# Patient Record
Sex: Female | Born: 1950 | Race: White | Hispanic: No | Marital: Married | Smoking: Never smoker
Health system: Southern US, Community
[De-identification: ages and names within clinical notes are randomized; demographics above are authoritative.]

## PROBLEM LIST (undated history)

## (undated) DIAGNOSIS — K529 Noninfective gastroenteritis and colitis, unspecified: Secondary | ICD-10-CM

## (undated) DIAGNOSIS — I10 Essential (primary) hypertension: Secondary | ICD-10-CM

## (undated) DIAGNOSIS — N200 Calculus of kidney: Secondary | ICD-10-CM

## (undated) DIAGNOSIS — G43909 Migraine, unspecified, not intractable, without status migrainosus: Secondary | ICD-10-CM

## (undated) DIAGNOSIS — J45909 Unspecified asthma, uncomplicated: Secondary | ICD-10-CM

## (undated) HISTORY — PX: SHOULDER SURGERY: SHX246

## (undated) HISTORY — PX: ABDOMINAL HYSTERECTOMY: SHX81

## (undated) HISTORY — PX: TONSILLECTOMY: SUR1361

---

## 2000-12-27 ENCOUNTER — Encounter: Payer: Self-pay | Admitting: *Deleted

## 2000-12-28 ENCOUNTER — Encounter: Payer: Self-pay | Admitting: *Deleted

## 2000-12-28 ENCOUNTER — Inpatient Hospital Stay (HOSPITAL_COMMUNITY): Admission: EM | Admit: 2000-12-28 | Discharge: 2001-01-01 | Payer: Self-pay | Admitting: *Deleted

## 2001-01-26 ENCOUNTER — Ambulatory Visit (HOSPITAL_COMMUNITY): Admission: RE | Admit: 2001-01-26 | Discharge: 2001-01-26 | Payer: Self-pay | Admitting: Internal Medicine

## 2001-11-02 ENCOUNTER — Ambulatory Visit (HOSPITAL_COMMUNITY): Admission: RE | Admit: 2001-11-02 | Discharge: 2001-11-02 | Payer: Self-pay | Admitting: Obstetrics & Gynecology

## 2001-11-02 ENCOUNTER — Encounter: Payer: Self-pay | Admitting: Obstetrics & Gynecology

## 2002-05-13 ENCOUNTER — Encounter (HOSPITAL_COMMUNITY): Admission: RE | Admit: 2002-05-13 | Discharge: 2002-06-12 | Payer: Self-pay | Admitting: Internal Medicine

## 2002-05-14 ENCOUNTER — Ambulatory Visit: Admission: RE | Admit: 2002-05-14 | Discharge: 2002-05-14 | Payer: Self-pay | Admitting: Internal Medicine

## 2002-05-14 ENCOUNTER — Encounter: Payer: Self-pay | Admitting: Internal Medicine

## 2002-05-21 ENCOUNTER — Encounter: Payer: Self-pay | Admitting: Neurology

## 2002-05-21 ENCOUNTER — Inpatient Hospital Stay (HOSPITAL_COMMUNITY): Admission: EM | Admit: 2002-05-21 | Discharge: 2002-05-24 | Payer: Self-pay | Admitting: Emergency Medicine

## 2002-05-24 ENCOUNTER — Encounter: Payer: Self-pay | Admitting: Family Medicine

## 2002-05-24 ENCOUNTER — Encounter: Payer: Self-pay | Admitting: *Deleted

## 2002-05-31 ENCOUNTER — Ambulatory Visit (HOSPITAL_COMMUNITY): Admission: RE | Admit: 2002-05-31 | Discharge: 2002-05-31 | Payer: Self-pay | Admitting: Internal Medicine

## 2002-05-31 ENCOUNTER — Encounter: Payer: Self-pay | Admitting: Internal Medicine

## 2002-06-01 ENCOUNTER — Ambulatory Visit (HOSPITAL_COMMUNITY): Admission: RE | Admit: 2002-06-01 | Discharge: 2002-06-01 | Payer: Self-pay | Admitting: Internal Medicine

## 2002-06-01 ENCOUNTER — Encounter: Payer: Self-pay | Admitting: Internal Medicine

## 2002-08-06 ENCOUNTER — Ambulatory Visit (HOSPITAL_COMMUNITY): Admission: RE | Admit: 2002-08-06 | Discharge: 2002-08-06 | Payer: Self-pay | Admitting: Internal Medicine

## 2002-08-06 ENCOUNTER — Encounter: Payer: Self-pay | Admitting: Internal Medicine

## 2002-08-20 ENCOUNTER — Encounter (HOSPITAL_COMMUNITY): Admission: RE | Admit: 2002-08-20 | Discharge: 2002-09-19 | Payer: Self-pay | Admitting: Orthopaedic Surgery

## 2002-10-02 ENCOUNTER — Encounter (HOSPITAL_COMMUNITY): Admission: RE | Admit: 2002-10-02 | Discharge: 2002-11-01 | Payer: Self-pay | Admitting: Orthopedic Surgery

## 2002-11-06 ENCOUNTER — Encounter (HOSPITAL_COMMUNITY): Admission: RE | Admit: 2002-11-06 | Discharge: 2002-12-06 | Payer: Self-pay | Admitting: Orthopedic Surgery

## 2019-05-20 ENCOUNTER — Other Ambulatory Visit: Payer: Self-pay | Admitting: Physician Assistant

## 2019-05-20 ENCOUNTER — Other Ambulatory Visit (HOSPITAL_COMMUNITY): Payer: Self-pay | Admitting: Physician Assistant

## 2019-05-20 DIAGNOSIS — G43719 Chronic migraine without aura, intractable, without status migrainosus: Secondary | ICD-10-CM

## 2019-06-01 ENCOUNTER — Encounter (INDEPENDENT_AMBULATORY_CARE_PROVIDER_SITE_OTHER): Payer: Self-pay

## 2019-06-01 ENCOUNTER — Ambulatory Visit
Admission: RE | Admit: 2019-06-01 | Discharge: 2019-06-01 | Disposition: A | Discharge status: Home / Self Care | Payer: Medicare Other | Source: Ambulatory Visit | Attending: Physician Assistant | Admitting: Physician Assistant

## 2019-06-01 ENCOUNTER — Other Ambulatory Visit: Payer: Self-pay

## 2019-06-01 DIAGNOSIS — G43719 Chronic migraine without aura, intractable, without status migrainosus: Secondary | ICD-10-CM | POA: Diagnosis present

## 2019-06-01 LAB — POCT I-STAT CREATININE: Creatinine, Ser: 1 mg/dL (ref 0.44–1.00)

## 2019-06-01 IMAGING — MR MR HEAD WO/W CM
13 series · 48 of 48 positions shown · IV contrast (gadavist)
Comparison: Brain MRI report [DATE]

CLINICAL DATA: Chronic migraine, worsening and mainly left-sided.

EXAM:
MRI HEAD WITHOUT AND WITH CONTRAST
TECHNIQUE: Multiplanar, multiecho pulse sequences of the brain and surrounding
structures were obtained without and with intravenous contrast.
CONTRAST:  7.5mL GADAVIST GADOBUTROL 1 MMOL/ML IV SOLN

[Series 5: ax dwi_tracew · axial · 3.0mm · 0.60mm/px · z∈[-68,+87]mm · 3 of 48 slices shown]
[im 1/48]
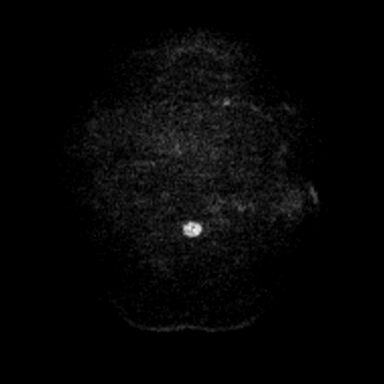
[im 24/48]
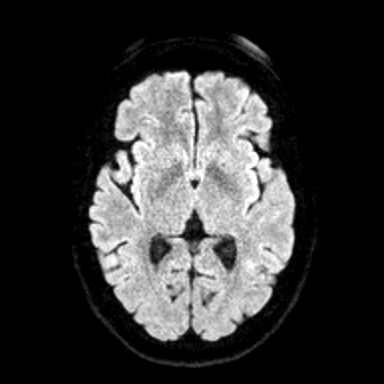
[im 48/48]
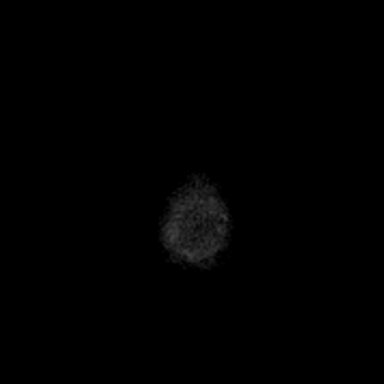

[Series 6: ax dwi_adc · axial · 3.0mm · 0.60mm/px · z∈[-68,+87]mm · 3 of 48 slices shown]
[im 1/48]
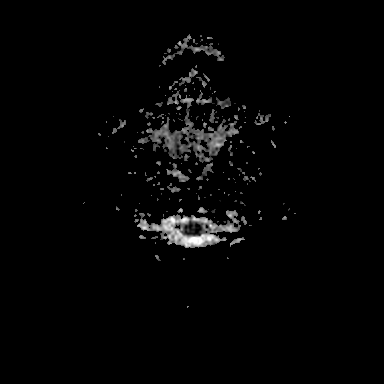
[im 24/48]
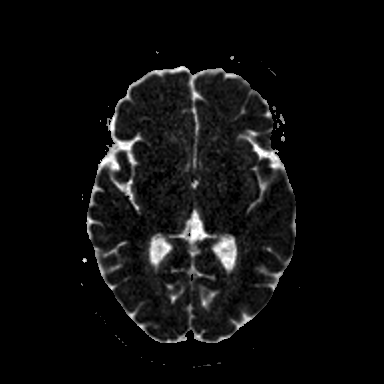
[im 48/48]
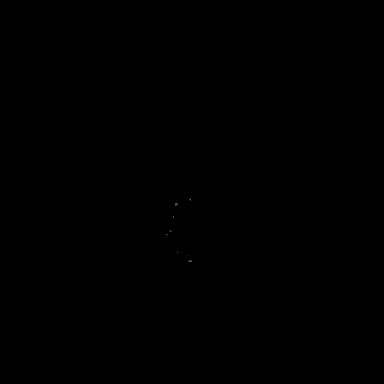

[Series 7: cor dwi_tracew · coronal · 5.0mm · 1.31mm/px · 2 of 38 slices shown]
[im 1/38]
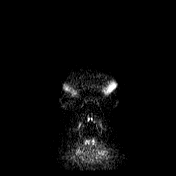
[im 38/38]
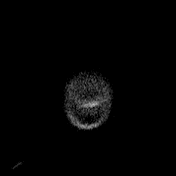

[Series 8: cor dwi_adc · coronal · 5.0mm · 1.31mm/px · 2 of 38 slices shown]
[im 1/38]
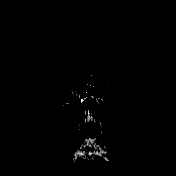
[im 38/38]
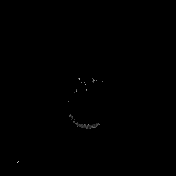

[Series 9: T1 · sagittal · 5.0mm · 0.62mm/px · 1 of 21 slices shown (1 of 2)]
[im 1/21]
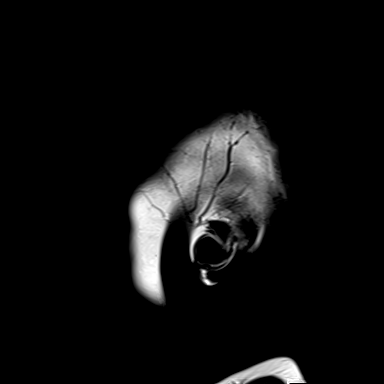

[Series 10: T2 · axial · 5.0mm · 0.53mm/px · z∈[-68,+87]mm · 2 of 27 slices shown]
[im 1/27]
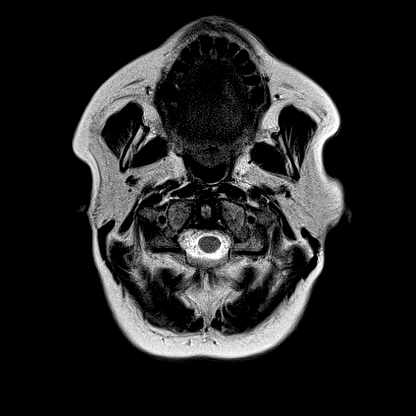
[im 27/27]
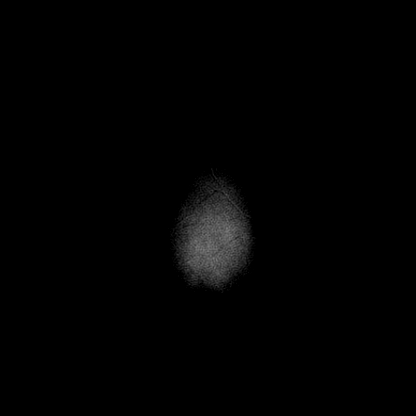

[Series 11: mag_images · axial · 3.0mm · 0.90mm/px · z∈[-79,+97]mm · 4 of 60 slices shown]
[im 1/60]
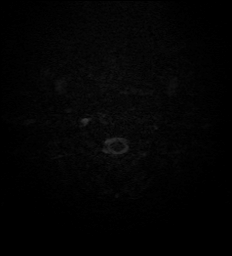
[im 20/60]
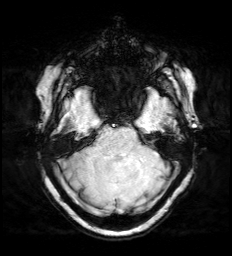
[im 40/60]
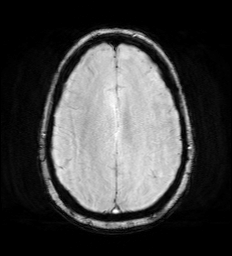
[im 60/60]
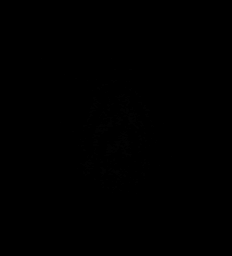

[Series 12: pha_images · axial · 3.0mm · 0.90mm/px · z∈[-79,+97]mm · 4 of 60 slices shown]
[im 1/60]
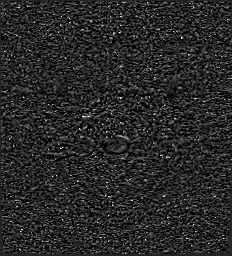
[im 20/60]
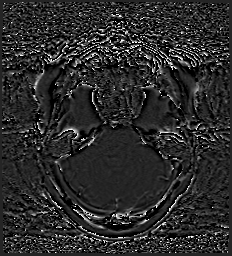
[im 40/60]
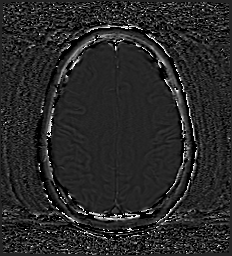
[im 60/60]
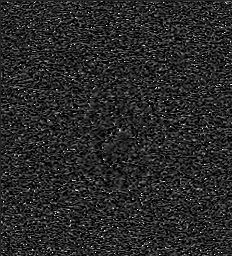

[Series 15: FLAIR · axial · 3.0mm · 0.53mm/px · z∈[-71,+90]mm · 3 of 55 slices shown]
[im 1/55]
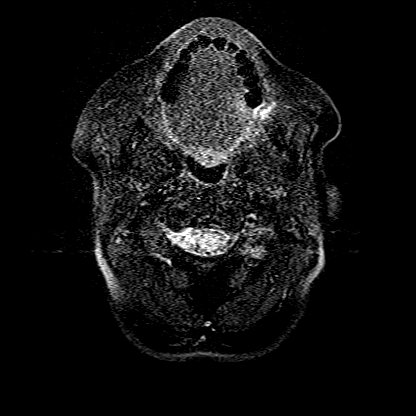
[im 28/55]
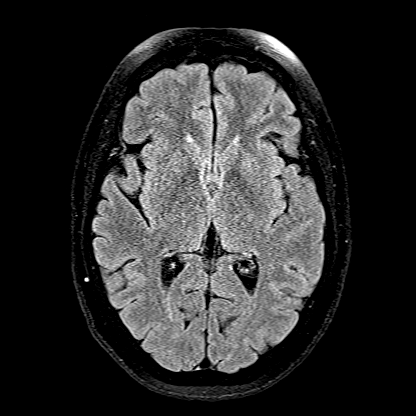
[im 55/55]
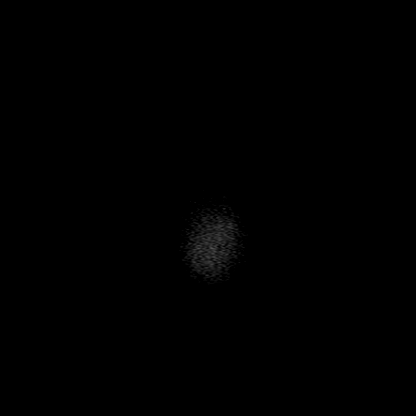

[Series 16: T1 · axial · 1.0mm · 0.98mm/px · z∈[-76,+99]mm · 10 of 174 slices shown (2 of 2)]
[im 1/174]
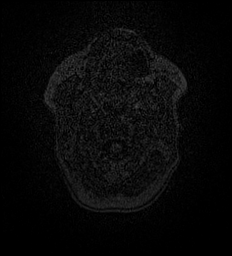
[im 20/174]
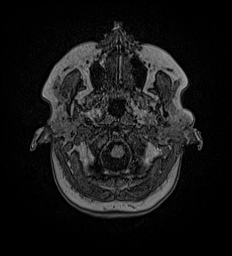
[im 39/174]
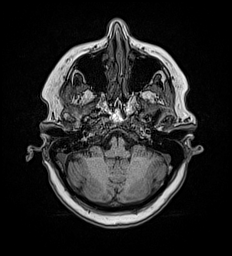
[im 58/174]
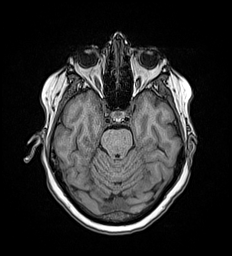
[im 77/174]
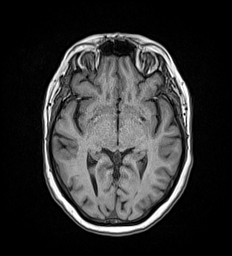
[im 97/174]
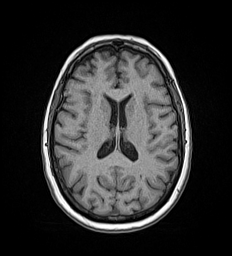
[im 116/174]
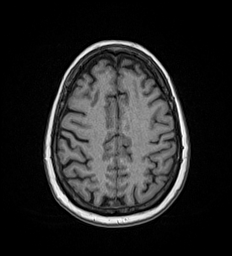
[im 135/174]
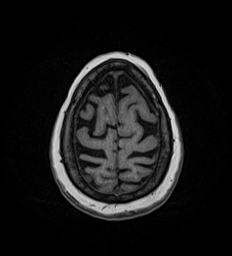
[im 154/174]
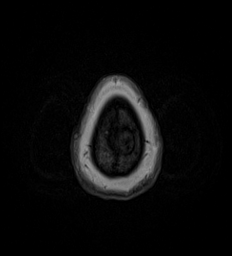
[im 174/174]
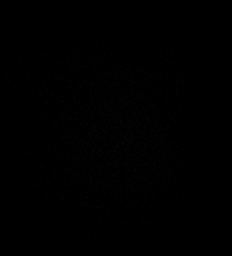

[Series 17: T2 post-contrast · coronal · 5.0mm · 0.57mm/px · 2 of 31 slices shown]
[im 1/31]
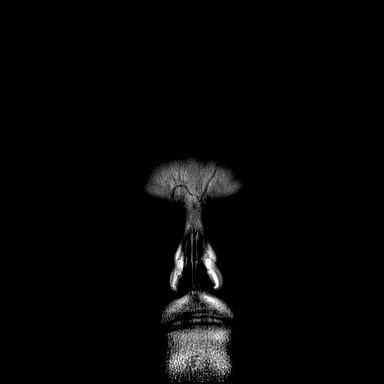
[im 31/31]
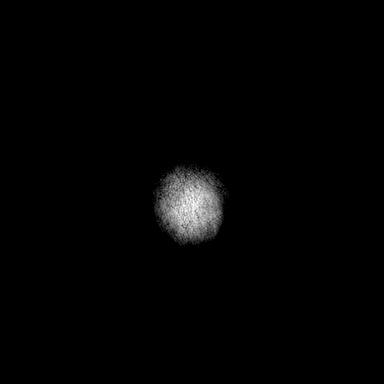

[Series 18: T1 post-contrast · axial · 1.0mm · 0.98mm/px · z∈[-76,+98]mm · 10 of 172 slices shown (1 of 2)]
[im 1/172]
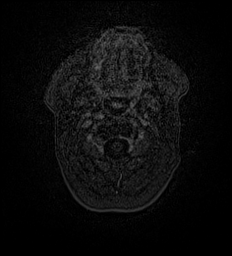
[im 20/172]
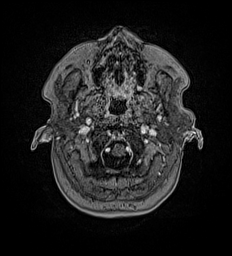
[im 39/172]
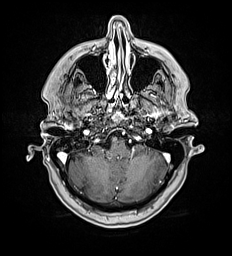
[im 58/172]
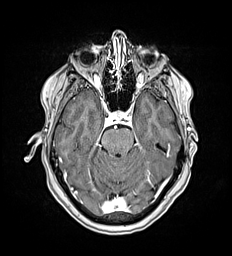
[im 77/172]
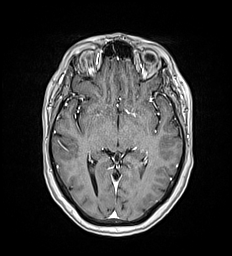
[im 96/172]
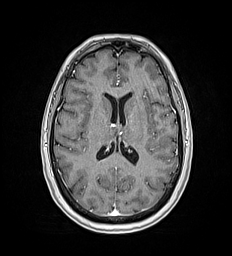
[im 115/172]
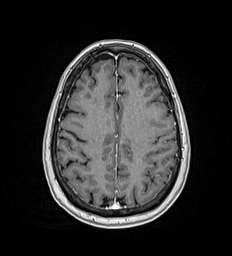
[im 134/172]
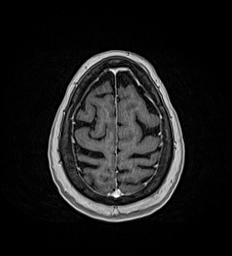
[im 153/172]
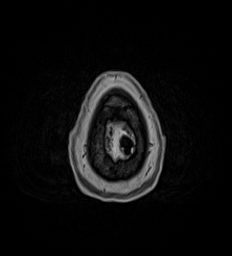
[im 172/172]
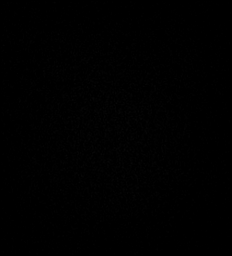

[Series 19: T1 post-contrast · coronal · 5.0mm · 0.57mm/px · 2 of 31 slices shown (2 of 2)]
[im 1/31]
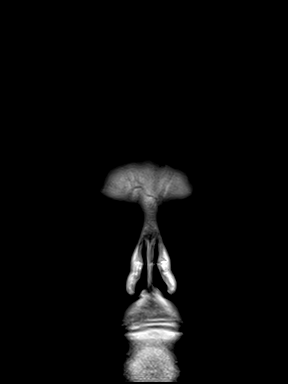
[im 31/31]
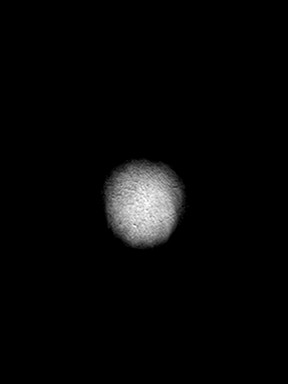

[48 of 48 positions shown; findings below may reference images not displayed]

FINDINGS: Brain: No infarction, hemorrhage, hydrocephalus, extra-axial
collection or mass lesion. Normal brain volume and white matter
appearance for age.

Vascular: Normal flow voids and vascular enhancements.

Skull and upper cervical spine: Normal marrow signal. Degenerative
facet spurring in the visualized upper cervical spine.

Sinuses/Orbits: Partial right mastoid opacification that is chronic
based on [LE] report. Negative nasopharynx.
IMPRESSION: 1. Unremarkable MRI of the brain.
2. Partial right mastoid opacification which was also noted in a
[LE] MRI report.

## 2019-06-01 MED ORDER — GADOBUTROL 1 MMOL/ML IV SOLN
7.5000 mL | Freq: Once | INTRAVENOUS | Status: AC | PRN
  Administered 2019-06-01: 7.5 mL via INTRAVENOUS

## 2020-01-18 ENCOUNTER — Other Ambulatory Visit: Payer: Self-pay

## 2020-01-18 ENCOUNTER — Ambulatory Visit
Admission: EM | Admit: 2020-01-18 | Discharge: 2020-01-18 | Disposition: A | Discharge status: Home / Self Care | Payer: Medicare Other

## 2020-01-18 DIAGNOSIS — R519 Headache, unspecified: Secondary | ICD-10-CM | POA: Diagnosis not present

## 2020-01-18 HISTORY — DX: Essential (primary) hypertension: I10

## 2020-01-18 MED ORDER — VALACYCLOVIR HCL 1 G PO TABS: 500.0000 mg | ORAL_TABLET | Freq: Two times a day (BID) | ORAL | 0 refills | Status: AC

## 2020-01-18 MED ORDER — MELOXICAM 7.5 MG PO TABS: 7.5000 mg | ORAL_TABLET | Freq: Every day | ORAL | 0 refills | Status: DC

## 2020-01-18 MED ORDER — PREDNISONE 10 MG (21) PO TBPK: ORAL_TABLET | Freq: Every day | ORAL | 0 refills | Status: DC

## 2020-01-18 NOTE — Discharge Instructions (Addendum)
Will cover for possible shingles Rest and use ice/heat as needed for symptomatic relief Prescribed valacyclovir prescribed Prescribed prednisone taper for inflammation and pain Mobic as needed for pain Follow up with PCP in 7-10 days if rash is still present Follow up with PCP if symptoms of burning, stinging, tingling or numbness occur after rash resolves, you may need additional treatment Return here or go to ER if you have any new or worsening symptoms (such as eye involvement, severe pain, or signs of secondary infection such as fever, chills, nausea, vomiting, discharge, redness or warmth over site of rash)

## 2020-01-18 NOTE — ED Triage Notes (Signed)
Pt states she has had shingles in scalp before and is having same pain that began yesterday, does not have rash yet

## 2020-01-18 NOTE — ED Provider Notes (Signed)
[facility]   [id number] 01/18/20 Arrival Time: 1503  CC: Scalp pain  SUBJECTIVE:  Meredith Guerra is a 69 y.o. female who presents with a sharp pain to top LT scalp x 1 day.  Denies precipitating event or trauma.  Reports hx of shingles and is concerned this is shingles.  Localizes the symptoms to LT scalp.  Describes it as sharp, shooting pain.  Has tried OTC medication without relief.  Symptoms are made worse to touch.  Reports similar symptoms in the past with shingles.  Denies fever, chills, nausea, vomiting, erythema, swelling, discharge, SOB, chest pain, abdominal pain, changes in bowel or bladder function.    ROS: As per HPI.  All other pertinent ROS negative.     Past Medical History:  Diagnosis Date  . Hypertension    History reviewed. No pertinent surgical history. Allergies  Allergen Reactions  . Reglan [Metoclopramide] Other (See Comments)   No current facility-administered medications on file prior to encounter.   Current Outpatient Medications on File Prior to Encounter  Medication Sig Dispense Refill  . DULoxetine (CYMBALTA) 20 MG capsule duloxetine 20 mg capsule,delayed release  TAKE 1 CAPSULE BY MOUTH 2 TIMES EVERY BEDTIME    . Erenumab-aooe (AIMOVIG) 70 MG/ML SOAJ Aimovig Autoinjector 70 mg/mL subcutaneous auto-injector  INJECT (70MG ) BY SUBCUTANEOUS ROUTE EVERY MONTH IN THE ABDOMEN, THIGH, OR OUTER AREA OF UPPER ARM    . atorvastatin (LIPITOR) 10 MG tablet atorvastatin 10 mg tablet  TAKE 1 TABLET BY MOUTH EVERYDAY AT BEDTIME    . levothyroxine (SYNTHROID) 25 MCG tablet levothyroxine 25 mcg tablet  TAKE 1.5 TABLET BY MOUTH EVERY DAY ON EMPTY STOMACH     Social History   Socioeconomic History  . Marital status: Married    Spouse name: Not on file  . Number of children: Not on file  . Years of education: Not on file  . Highest education level: Not on file  Occupational History  . Not on file  Tobacco Use  . Smoking status: Never Smoker  .  Smokeless tobacco: Never Used  Substance and Sexual Activity  . Alcohol use: Not Currently  . Drug use: Not Currently  . Sexual activity: Not on file  Other Topics Concern  . Not on file  Social History Narrative  . Not on file   Social Determinants of Health   Financial Resource Strain:   . Difficulty of Paying Living Expenses: Not on file  Food Insecurity:   . Worried About in the Last Year: Not on file  . Ran Out of Food in the Last Year: Not on file  Transportation Needs:   . Lack of Transportation (Medical): Not on file  . Lack of Transportation (Non-Medical): Not on file  Physical Activity:   . Days of Exercise per Week: Not on file  . Minutes of Exercise per Session: Not on file  Stress:   . Feeling of Stress : Not on file  Social Connections:   . Frequency of Communication with Friends and Family: Not on file  . Frequency of Social Gatherings with Friends and Family: Not on file  . Attends Religious Services: Not on file  . Active Member of Clubs or Organizations: Not on file  . Attends Programme researcher, broadcasting/film/video Meetings: Not on file  . Marital Status: Not on file  Intimate Partner Violence:   . Fear of Current or Ex-Partner: Not on file  . Emotionally Abused: Not on file  . Physically  Abused: Not on file  . Sexually Abused: Not on file   Family History  Family history unknown: Yes    OBJECTIVE: Vitals:   01/18/20 1517  BP: 97/70  Pulse: 67  Resp: (!) 22  Temp: 98.8 F (37.1 C)  SpO2: 94%    General appearance: alert; no distress Head: NCAT; TTP over LT posterior scalp ENT: Ears: EACs clear, TMs pearly gray; Eyes: PERRL.  EOM grossly intact.  Nose: no clear rhinorrhea; tonsils nonerythematous, uvula midline  Lungs: normal respiratory effort Extremities: no edema Skin: warm and dry; no rash present Psychological: alert and cooperative; normal mood and affect  ASSESSMENT & PLAN:  1. Scalp pain     Meds ordered this encounter    Medications  . meloxicam (MOBIC) 7.5 MG tablet    Sig: Take 1 tablet (7.5 mg total) by mouth daily.    Dispense:  30 tablet    Refill:  0    Order Specific Question:   Supervising Provider    Answer:   Eustace Moore [[id number]]  . predniSONE (STERAPRED UNI-PAK 21 TAB) 10 MG (21) TBPK tablet    Sig: Take by mouth daily. Take 6 tabs by mouth daily  for 2 days, then 5 tabs for 2 days, then 4 tabs for 2 days, then 3 tabs for 2 days, 2 tabs for 2 days, then 1 tab by mouth daily for 2 days    Dispense:  42 tablet    Refill:  0    Order Specific Question:   Supervising Provider    Answer:   Eustace Moore [[id number]]  . valACYclovir (VALTREX) 1000 MG tablet    Sig: Take 0.5 tablets (500 mg total) by mouth 2 (two) times daily for 10 days.    Dispense:  10 tablet    Refill:  0    Order Specific Question:   Supervising Provider    Answer:   Eustace Moore [[id number]]    Rest and use ice/heat as needed for symptomatic relief Prescribed valacyclovir 1000mg  3x/day for 10 days Prescribed prednisone taper for inflammation and pain Mobic as needed for pain Follow up with PCP in 7-10 days if rash is still present Follow up with PCP if symptoms of burning, stinging, tingling or numbness occur after rash resolves, you may need additional treatment Return here or go to ER if you have any new or worsening symptoms (such as eye involvement, severe pain, or signs of secondary infection such as fever, chills, nausea, vomiting, discharge, redness or warmth over site of rash)   Reviewed expectations re: course of current medical issues. Questions answered. Outlined signs and symptoms indicating need for more acute intervention. Patient verbalized understanding. After Visit Summary given.   9-10, PA-C 01/18/20 1554

## 2020-06-19 ENCOUNTER — Encounter (INDEPENDENT_AMBULATORY_CARE_PROVIDER_SITE_OTHER): Payer: Self-pay | Admitting: *Deleted

## 2020-06-20 ENCOUNTER — Other Ambulatory Visit: Payer: Self-pay

## 2020-06-20 ENCOUNTER — Emergency Department (HOSPITAL_COMMUNITY)
Admission: EM | Admit: 2020-06-20 | Discharge: 2020-06-20 | Disposition: A | Discharge status: Home / Self Care | Payer: Medicare Other | Attending: Emergency Medicine | Admitting: Emergency Medicine

## 2020-06-20 ENCOUNTER — Encounter (HOSPITAL_COMMUNITY): Payer: Self-pay

## 2020-06-20 ENCOUNTER — Emergency Department (HOSPITAL_COMMUNITY): Payer: Medicare Other

## 2020-06-20 DIAGNOSIS — M25512 Pain in left shoulder: Secondary | ICD-10-CM | POA: Diagnosis not present

## 2020-06-20 DIAGNOSIS — R072 Precordial pain: Secondary | ICD-10-CM | POA: Insufficient documentation

## 2020-06-20 DIAGNOSIS — M79602 Pain in left arm: Secondary | ICD-10-CM | POA: Insufficient documentation

## 2020-06-20 DIAGNOSIS — M546 Pain in thoracic spine: Secondary | ICD-10-CM | POA: Diagnosis not present

## 2020-06-20 DIAGNOSIS — I10 Essential (primary) hypertension: Secondary | ICD-10-CM | POA: Diagnosis not present

## 2020-06-20 DIAGNOSIS — R0789 Other chest pain: Secondary | ICD-10-CM | POA: Diagnosis present

## 2020-06-20 DIAGNOSIS — J45909 Unspecified asthma, uncomplicated: Secondary | ICD-10-CM | POA: Diagnosis not present

## 2020-06-20 DIAGNOSIS — R61 Generalized hyperhidrosis: Secondary | ICD-10-CM | POA: Diagnosis not present

## 2020-06-20 HISTORY — DX: Migraine, unspecified, not intractable, without status migrainosus: G43.909

## 2020-06-20 HISTORY — DX: Noninfective gastroenteritis and colitis, unspecified: K52.9

## 2020-06-20 HISTORY — DX: Calculus of kidney: N20.0

## 2020-06-20 HISTORY — DX: Unspecified asthma, uncomplicated: J45.909

## 2020-06-20 LAB — CBC WITH DIFFERENTIAL/PLATELET
Abs Immature Granulocytes: 0.03 10*3/uL (ref 0.00–0.07)
Basophils Absolute: 0.1 10*3/uL (ref 0.0–0.1)
Basophils Relative: 1 %
Eosinophils Absolute: 0.2 10*3/uL (ref 0.0–0.5)
Eosinophils Relative: 3 %
HCT: 43.5 % (ref 36.0–46.0)
Hemoglobin: 14.5 g/dL (ref 12.0–15.0)
Immature Granulocytes: 0 %
Lymphocytes Relative: 27 %
Lymphs Abs: 2.4 10*3/uL (ref 0.7–4.0)
MCH: 29.4 pg (ref 26.0–34.0)
MCHC: 33.3 g/dL (ref 30.0–36.0)
MCV: 88.2 fL (ref 80.0–100.0)
Monocytes Absolute: 0.7 10*3/uL (ref 0.1–1.0)
Monocytes Relative: 8 %
Neutro Abs: 5.5 10*3/uL (ref 1.7–7.7)
Neutrophils Relative %: 61 %
Platelets: 264 10*3/uL (ref 150–400)
RBC: 4.93 MIL/uL (ref 3.87–5.11)
RDW: 13.3 % (ref 11.5–15.5)
WBC: 8.9 10*3/uL (ref 4.0–10.5)
nRBC: 0 % (ref 0.0–0.2)

## 2020-06-20 LAB — BASIC METABOLIC PANEL
Anion gap: 11 (ref 5–15)
BUN: 18 mg/dL (ref 8–23)
CO2: 26 mmol/L (ref 22–32)
Calcium: 9.9 mg/dL (ref 8.9–10.3)
Chloride: 104 mmol/L (ref 98–111)
Creatinine, Ser: 1.11 mg/dL — ABNORMAL HIGH (ref 0.44–1.00)
GFR, Estimated: 54 mL/min — ABNORMAL LOW (ref >60)
Glucose, Bld: 134 mg/dL — ABNORMAL HIGH (ref 70–99)
Potassium: 3.6 mmol/L (ref 3.5–5.1)
Sodium: 141 mmol/L (ref 135–145)

## 2020-06-20 LAB — TROPONIN I (HIGH SENSITIVITY)
Troponin I (High Sensitivity): 4 ng/L (ref <18)
Troponin I (High Sensitivity): 3 ng/L (ref <18)

## 2020-06-20 IMAGING — DX DG CHEST 2V
2 series · 2 of 2 positions shown · non-contrast
Comparison: None.

CLINICAL DATA: Chest pain.  Shortness of breath.

EXAM:
CHEST - 2 VIEW

[chest pa]
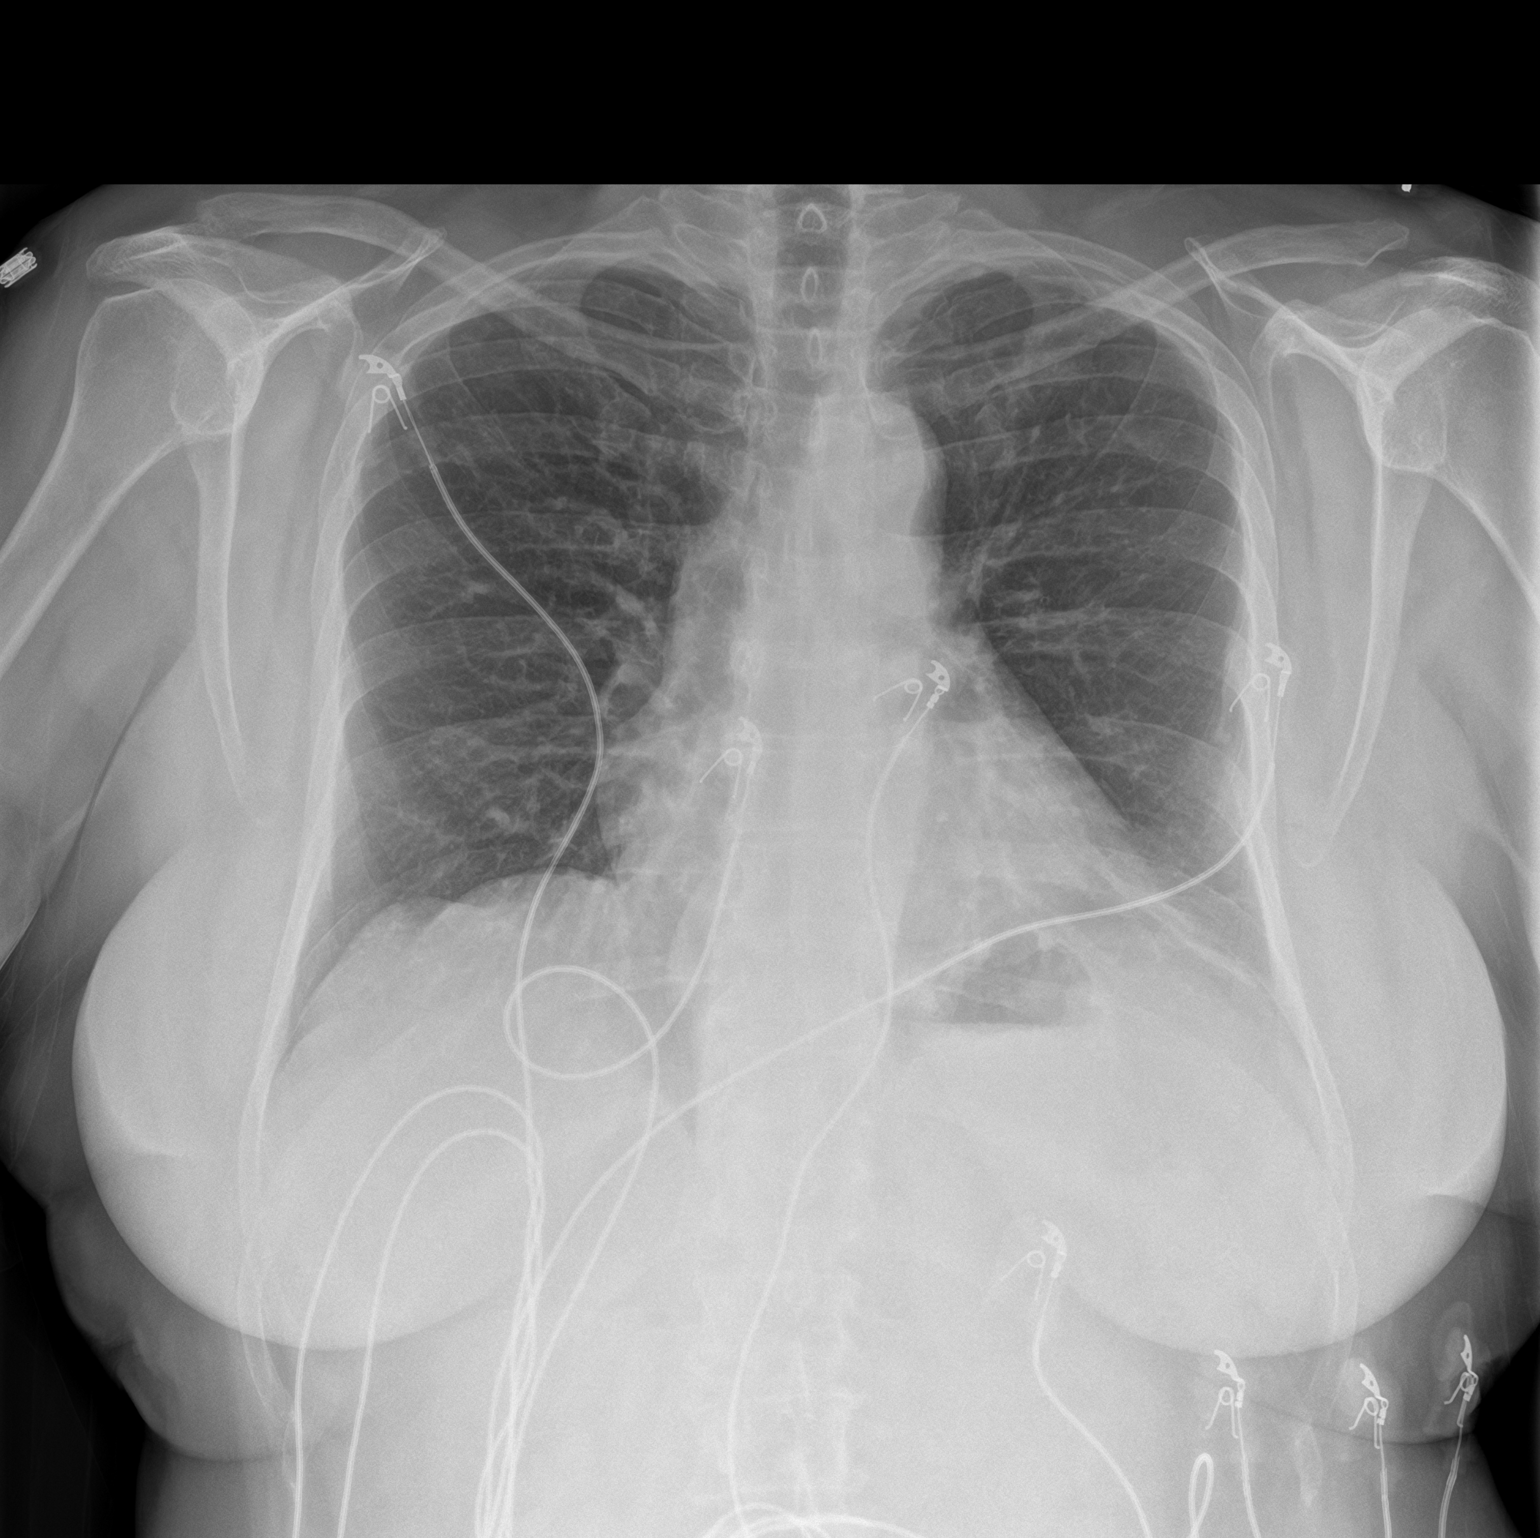

[chest lat]
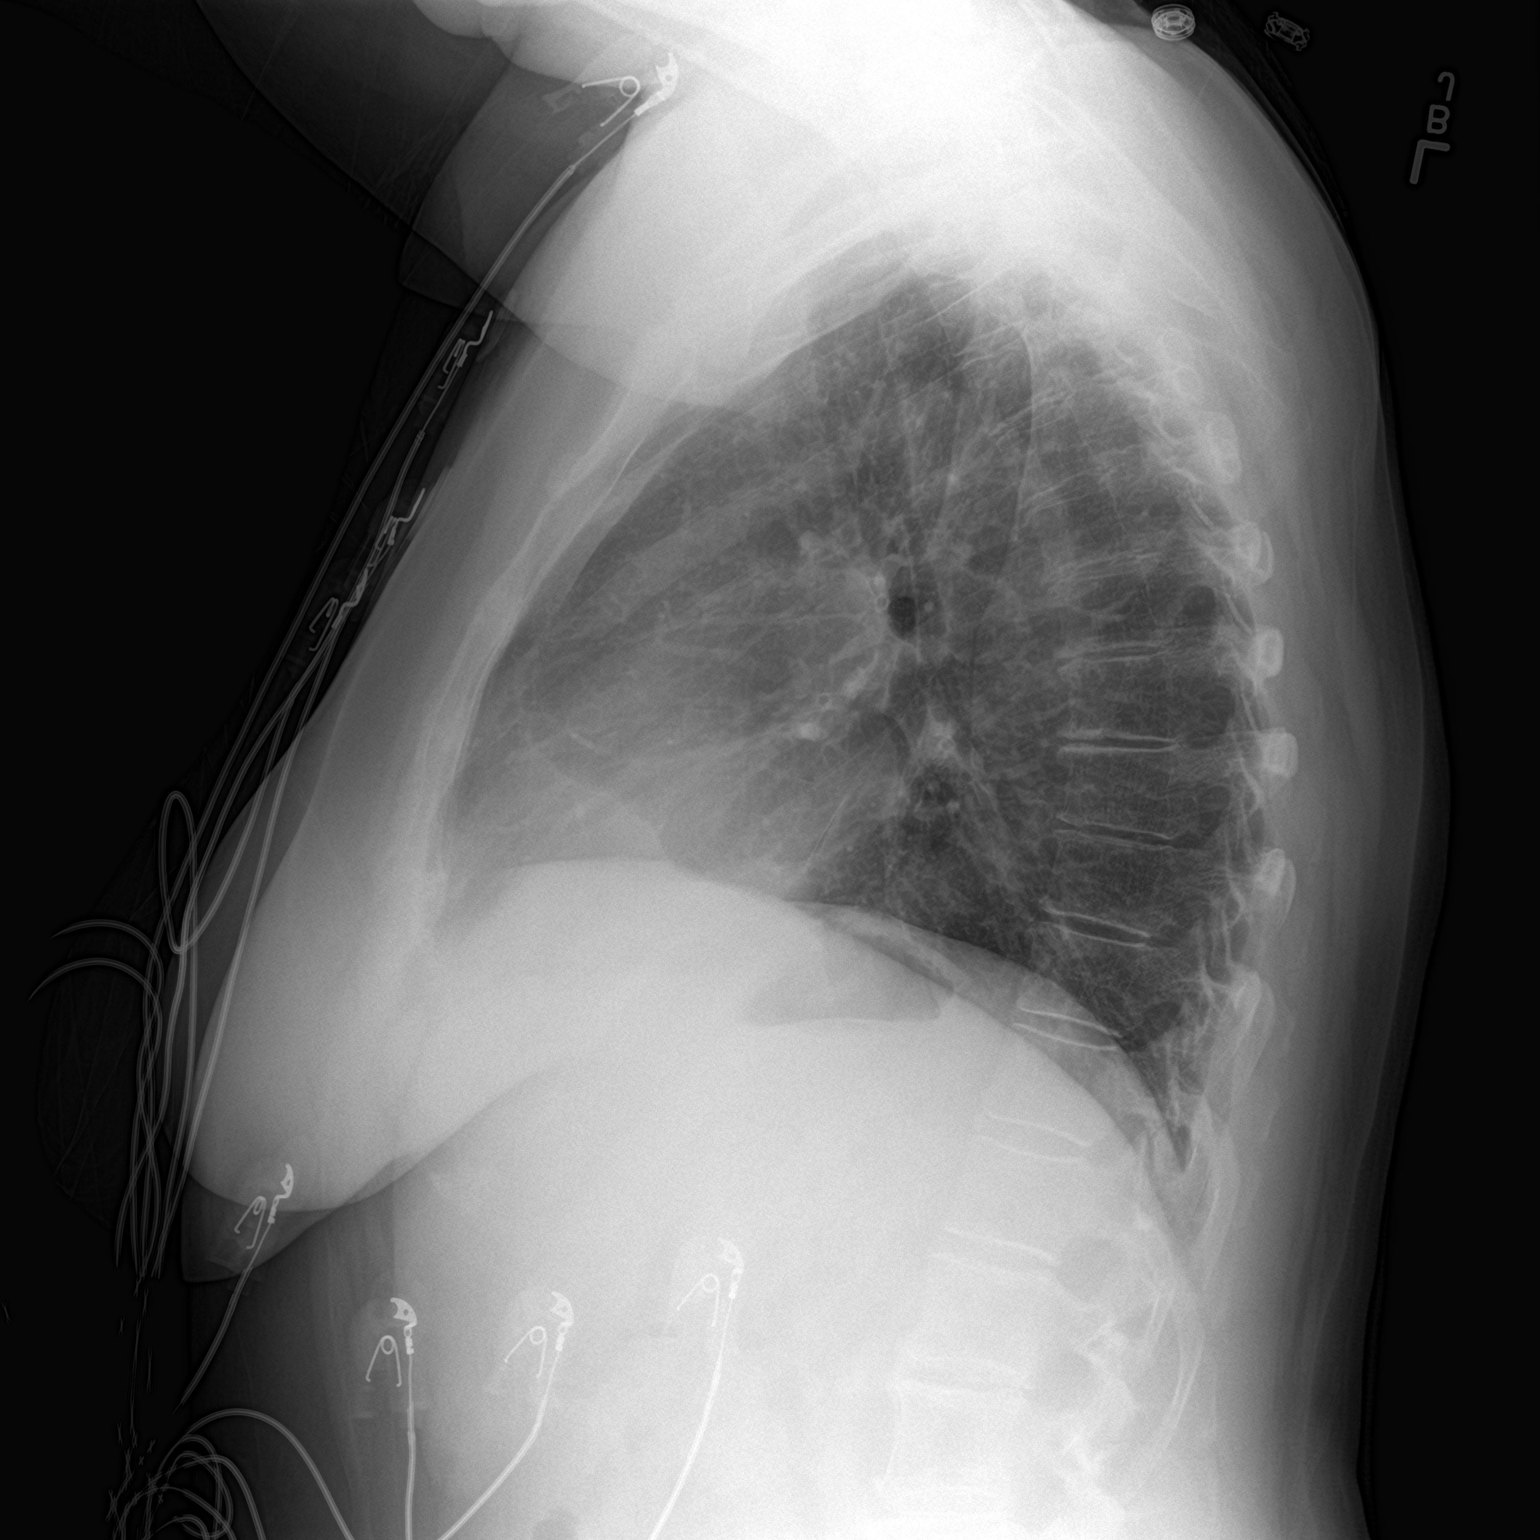

[2 of 2 positions shown; findings below may reference images not displayed]

FINDINGS: The heart size is within normal limits. The hila and mediastinum are
normal. Mildly coarsened lung markings. Probable atelectasis in the
left base. No other acute abnormalities.
IMPRESSION: Coarsened lung markings may represent bronchitic change. No focal
infiltrate. Atelectasis in the left base.

## 2020-06-20 MED ORDER — PANTOPRAZOLE SODIUM 20 MG PO TBEC: 20.0000 mg | DELAYED_RELEASE_TABLET | Freq: Every day | ORAL | 1 refills | Status: DC

## 2020-06-20 MED ORDER — KETOROLAC TROMETHAMINE 30 MG/ML IJ SOLN
15.0000 mg | Freq: Once | INTRAMUSCULAR | Status: AC
  Administered 2020-06-20: 15 mg via INTRAVENOUS
  Filled 2020-06-20: qty 1

## 2020-06-20 MED ORDER — ALUM & MAG HYDROXIDE-SIMETH 200-200-20 MG/5ML PO SUSP
30.0000 mL | Freq: Once | ORAL | Status: AC
  Administered 2020-06-20: 30 mL via ORAL
  Filled 2020-06-20: qty 30

## 2020-06-20 MED ORDER — LIDOCAINE VISCOUS HCL 2 % MT SOLN
15.0000 mL | Freq: Once | OROMUCOSAL | Status: AC
  Administered 2020-06-20: 15 mL via ORAL
  Filled 2020-06-20: qty 15

## 2020-06-20 NOTE — ED Triage Notes (Signed)
Pt presents to ED for left sided chest pain radiating down left arm started this am, denies SOB, nausea, dizziness.

## 2020-06-20 NOTE — Discharge Instructions (Signed)
Please read and follow all provided instructions.  Your diagnoses today include:  1. Precordial pain   2. Acute midline thoracic back pain     Tests performed today include:  An EKG of your heart  A chest x-ray  Cardiac enzymes - a blood test for heart muscle damage  Blood counts and electrolytes  Vital signs. See below for your results today.   Medications prescribed:   Pantoprazole - medication for stomach acid  Take any prescribed medications only as directed.  Follow-up instructions: Please follow-up with your primary care provider as soon as you can for further evaluation of your symptoms.   Return instructions:  SEEK IMMEDIATE MEDICAL ATTENTION IF:  You have severe chest pain, especially if the pain is crushing or pressure-like and spreads to the arms, back, neck, or jaw, or if you have sweating, nausea (feeling sick to your stomach), or shortness of breath. THIS IS AN EMERGENCY. Don't wait to see if the pain will go away. Get medical help at once. Call 911 or 0 (operator). DO NOT drive yourself to the hospital.   Your chest pain gets worse and does not go away with rest.   You have an attack of chest pain lasting longer than usual, despite rest and treatment with the medications your caregiver has prescribed.   You wake from sleep with chest pain or shortness of breath.  You feel dizzy or faint.  You have chest pain not typical of your usual pain for which you originally saw your caregiver.   You have any other emergent concerns regarding your health.  Additional Information: Chest pain comes from many different causes. Your caregiver has diagnosed you as having chest pain that is not specific for one problem, but does not require admission.  You are at low risk for an acute heart condition or other serious illness.   Your vital signs today were: BP (!) 183/74   Pulse (!) 58   Temp 98.4 F (36.9 C) (Oral)   Resp (!) 21   Ht 5\' 3"  (1.6 m)   Wt 75.8 kg    SpO2 96%   BMI 29.58 kg/m  If your blood pressure (BP) was elevated above 135/85 this visit, please have this repeated by your doctor within one month. --------------

## 2020-06-20 NOTE — ED Provider Notes (Signed)
[facility] EMERGENCY DEPARTMENT Provider Note   CSN: [id number] Arrival date & time: 06/20/20  1402     History Chief Complaint  Patient presents with  . Chest Pain    Meredith Guerra is a 70 y.o. female.  Patient presents the emergency department today for evaluation of chest pain.  Patient states that she has a history of high cholesterol and Takotsubo cardiomyopathy in 2009 with a normal catheterization at that time.  Patient states that she has had pain in her upper back and shoulder blades over the past couple of days.  Today she developed anterior pain in the chest with radiation to the left arm.  No associated shortness of breath or trouble breathing.  No cough or fever.  No lightheadedness or syncope.  She has intermittent episodes of diaphoresis which are unchanged, unrelated to chest pain.  Symptoms occurred at rest.  She does state that over the past several months she has had episodes of severe heartburn associated with the sensation of food getting stuck in her throat as well as regurgitation at times.  She states that her doctor in [facility] is making a referral to a local gastroenterologist for consideration of EGD.  Patient does not have a history of hypertension, diabetes, tobacco use.  She does have a strong family history of coronary artery disease and parents and grandparents.  No numbness, weakness, tingling in extremities.        Past Medical History:  Diagnosis Date  . Asthma   . Colitis   . Hypertension   . Kidney stones   . Migraines     There are no problems to display for this patient.   Past Surgical History:  Procedure Laterality Date  . ABDOMINAL HYSTERECTOMY    . SHOULDER SURGERY    . TONSILLECTOMY       OB History   No obstetric history on file.     Family History  Family history unknown: Yes    Social History   Tobacco Use  . Smoking status: Never Smoker  . Smokeless tobacco: Never Used  Substance Use Topics  .  Alcohol use: Not Currently  . Drug use: Not Currently    Home Medications Prior to Admission medications   Medication Sig Start Date End Date Taking? Authorizing Provider  atorvastatin (LIPITOR) 10 MG tablet atorvastatin 10 mg tablet  TAKE 1 TABLET BY MOUTH EVERYDAY AT BEDTIME    [provider]  DULoxetine (CYMBALTA) 20 MG capsule duloxetine 20 mg capsule,delayed release  TAKE 1 CAPSULE BY MOUTH 2 TIMES EVERY BEDTIME 05/20/19   [provider]  Erenumab-aooe (AIMOVIG) 70 MG/ML SOAJ Aimovig Autoinjector 70 mg/mL subcutaneous auto-injector  INJECT (70MG ) BY SUBCUTANEOUS ROUTE EVERY MONTH IN THE ABDOMEN, THIGH, OR OUTER AREA OF UPPER ARM 05/23/19   [provider]  levothyroxine (SYNTHROID) 25 MCG tablet levothyroxine 25 mcg tablet  TAKE 1.5 TABLET BY MOUTH EVERY DAY ON EMPTY STOMACH    [provider]  meloxicam (MOBIC) 7.5 MG tablet Take 1 tablet (7.5 mg total) by mouth daily. 01/18/20   Wurst, 01/20/20, PA-C  predniSONE (STERAPRED UNI-PAK 21 TAB) 10 MG (21) TBPK tablet Take by mouth daily. Take 6 tabs by mouth daily  for 2 days, then 5 tabs for 2 days, then 4 tabs for 2 days, then 3 tabs for 2 days, 2 tabs for 2 days, then 1 tab by mouth daily for 2 days 01/18/20   01/20/20 Alvino Chapel, PA-C    Allergies  Reglan [metoclopramide]  Review of Systems   Review of Systems  Constitutional: Positive for diaphoresis (chronic). Negative for fever.  HENT: Negative for rhinorrhea and sore throat.   Eyes: Negative for redness.  Respiratory: Negative for cough.   Cardiovascular: Positive for chest pain.  Gastrointestinal: Positive for nausea. Negative for abdominal pain, diarrhea and vomiting.  Genitourinary: Negative for dysuria, frequency, hematuria and urgency.  Musculoskeletal: Positive for back pain. Negative for myalgias.  Skin: Negative for rash.  Neurological: Negative for headaches.    Physical Exam Updated Vital Signs BP (!) 153/93 (BP Location: Right  Arm)   Pulse 65   Temp 98.4 F (36.9 C) (Oral)   Resp 16   Ht 5\' 3"  (1.6 m)   Wt 75.8 kg   SpO2 95%   BMI 29.58 kg/m   Physical Exam Vitals and nursing note reviewed.  Constitutional:      General: She is not in acute distress.    Appearance: She is well-developed.  HENT:     Head: Normocephalic and atraumatic.     Right Ear: External ear normal.     Left Ear: External ear normal.     Nose: Nose normal.  Eyes:     Conjunctiva/sclera: Conjunctivae normal.  Cardiovascular:     Rate and Rhythm: Normal rate and regular rhythm.     Heart sounds: No murmur heard.   Pulmonary:     Effort: No respiratory distress.     Breath sounds: No wheezing, rhonchi or rales.  Abdominal:     Palpations: Abdomen is soft.     Tenderness: There is no abdominal tenderness. There is no guarding or rebound.  Musculoskeletal:     Cervical back: Normal range of motion and neck supple.     Right lower leg: No edema.     Left lower leg: No edema.  Skin:    General: Skin is warm and dry.     Findings: No rash.  Neurological:     General: No focal deficit present.     Mental Status: She is alert. Mental status is at baseline.     Motor: No weakness.  Psychiatric:        Mood and Affect: Mood normal.     ED Results / Procedures / Treatments   Labs (all labs ordered are listed, but only abnormal results are displayed) Labs Reviewed  BASIC METABOLIC PANEL - Abnormal; Notable for the following components:      Result Value   Glucose, Bld 134 (*)    Creatinine, Ser 1.11 (*)    GFR, Estimated 54 (*)    All other components within normal limits  CBC WITH DIFFERENTIAL/PLATELET  TROPONIN I (HIGH SENSITIVITY)  TROPONIN I (HIGH SENSITIVITY)    EKG EKG Interpretation  Date/Time:  Saturday June 20 2020 14:34:19 EDT Ventricular Rate:  65 PR Interval:  165 QRS Duration: 90 QT Interval:  431 QTC Calculation: 449 R Axis:   8 Text Interpretation: Sinus rhythm Confirmed by [id number]  [phone]) on 06/20/2020 2:56:04 PM   Radiology DG Chest 2 View  Result Date: 06/20/2020 CLINICAL DATA:  Chest pain.  Shortness of breath. EXAM: CHEST - 2 VIEW COMPARISON:  None. FINDINGS: The heart size is within normal limits. The hila and mediastinum are normal. Mildly coarsened lung markings. Probable atelectasis in the left base. No other acute abnormalities. IMPRESSION: Coarsened lung markings may represent bronchitic change. No focal infiltrate. Atelectasis in the left base. Electronically Signed   By: 08/20/2020  III M.D   On: 06/20/2020 15:12    Procedures Procedures   Medications Ordered in ED Medications  alum & mag hydroxide-simeth (MAALOX/MYLANTA) 200-200-20 MG/5ML suspension 30 mL (30 mLs Oral Given 06/20/20 1641)    And  lidocaine (XYLOCAINE) 2 % viscous mouth solution 15 mL (15 mLs Oral Given 06/20/20 1641)  ketorolac (TORADOL) 30 MG/ML injection 15 mg (15 mg Intravenous Given 06/20/20 1840)    ED Course  I have reviewed the triage vital signs and the nursing notes.  Pertinent labs & imaging results that were available during my care of the patient were reviewed by me and considered in my medical decision making (see chart for details).  Patient seen and examined. Work-up initiated. Medications ordered. EKG repeated due to poor baseline, reviewed with Dr. Hyacinth Meeker.   Vital signs reviewed and are as follows: BP (!) 153/93 (BP Location: Right Arm)   Pulse 65   Temp 98.4 F (36.9 C) (Oral)   Resp 16   Ht 5\' 3"  (1.6 m)   Wt 75.8 kg   SpO2 95%   BMI 29.58 kg/m   Patient with 2 neg troponins.  Pain is unchanged.  Will give GI cocktail.  Patient rechecked, GI cocktail improved some of the anterior pain.  She still feels pain in her shoulder and back.  Patient discussed with Dr. who has seen.  IV Toradol ordered.  7:15 PM patient rechecked.  IM Toradol made her feel better and pain is currently resolved.  Plan for discharge to home.  Encouraged use of Tylenol for pain.   Will restart on Protonix which she stopped taking about a week ago.  Patient was counseled to return with severe chest pain, especially if the pain is crushing or pressure-like and spreads to the arms, back, neck, or jaw, or if they have sweating, nausea, or shortness of breath with the pain. They were encouraged to call 911 with these symptoms.   The patient verbalized understanding and agreed.      MDM Rules/Calculators/A&P                          Patient presents emergency department today for chest pain as well as recent back and shoulder pain.  Symptoms are atypical in the sense that they are nonexertional.  She does not have associated diaphoresis with the pain or vomiting.  She does have a history of some GI issues and recently stopped taking Protonix.  This was restarted tonight.  Cardiac work-up in the ED with nonconcerning EKG, chest x-ray, troponin x2.  Pain was controlled with treatment.  She looks well.  I have low concern for dissection at this point given her description of symptoms and lack of associating symptoms such as stroke symptoms, extremity affects, abdominal pain.  She looks well and reliable to follow-up.   Final Clinical Impression(s) / ED Diagnoses Final diagnoses:  Precordial pain  Acute midline thoracic back pain    Rx / DC Orders ED Discharge Orders         Ordered    pantoprazole (PROTONIX) 20 MG tablet  Daily        06/20/20 1913           08/20/20, Renne Crigler 06/20/20 1917    08/20/20, MD 06/20/20 2326

## 2020-07-14 ENCOUNTER — Other Ambulatory Visit (HOSPITAL_COMMUNITY): Payer: Self-pay | Admitting: Internal Medicine

## 2020-07-14 ENCOUNTER — Other Ambulatory Visit: Payer: Self-pay | Admitting: Internal Medicine

## 2020-07-14 DIAGNOSIS — R1310 Dysphagia, unspecified: Secondary | ICD-10-CM

## 2020-07-27 ENCOUNTER — Ambulatory Visit (HOSPITAL_COMMUNITY): Payer: PRIVATE HEALTH INSURANCE

## 2020-07-27 ENCOUNTER — Ambulatory Visit (HOSPITAL_COMMUNITY)
Admission: RE | Admit: 2020-07-27 | Discharge: 2020-07-27 | Disposition: A | Discharge status: Home / Self Care | Payer: Medicare Other | Source: Ambulatory Visit | Attending: Internal Medicine | Admitting: Internal Medicine

## 2020-07-27 DIAGNOSIS — R1310 Dysphagia, unspecified: Secondary | ICD-10-CM | POA: Diagnosis present

## 2020-07-27 IMAGING — RF DG ESOPHAGUS
6 of 7 series · 15 of 24 positions shown · non-contrast
Comparison: None

CLINICAL DATA: Heartburn symptoms for 1-3 years, 3 months ago
developed dysphagia, feels like food is sticking in mid chest,
choking on solids and liquids

EXAM:
ESOPHOGRAM / BARIUM SWALLOW / BARIUM TABLET STUDY
TECHNIQUE: Combined double contrast and single contrast examination performed
using effervescent crystals, thick barium liquid, and thin barium
liquid. The patient was observed with fluoroscopy swallowing a 13 mm
barium sulphate tablet.
FLUOROSCOPY TIME:  Fluoroscopy Time:  2 minutes 24 seconds
Radiation Exposure Index (if provided by the fluoroscopic device):
54.7 mGy
Number of Acquired Spot Images: multiple fluoroscopic screen
captures

[Series 1: cp_standard · 0.18mm/px · 2 of 100 frames shown (1 of 6)]
[frame 16/100]
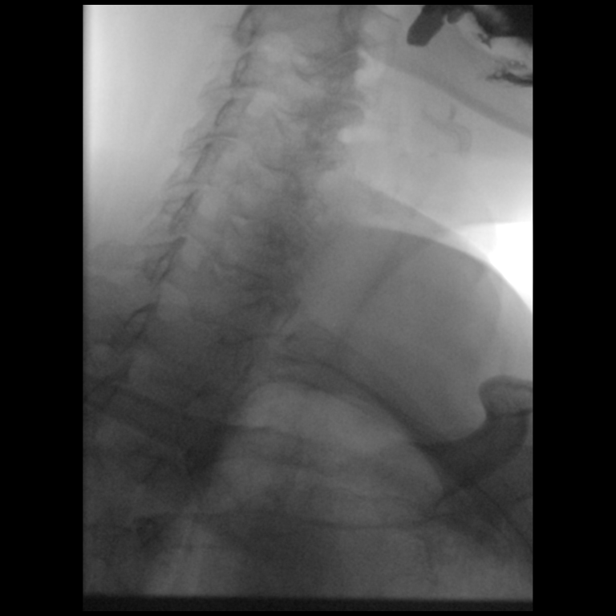
[frame 52/100]
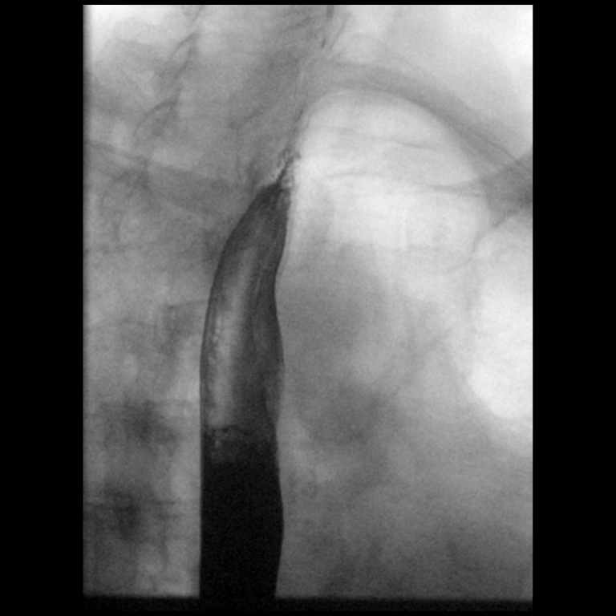

[Series 2: cp_standard · 0.18mm/px · 3 of 99 frames shown (2 of 6)]
[frame 14/99]
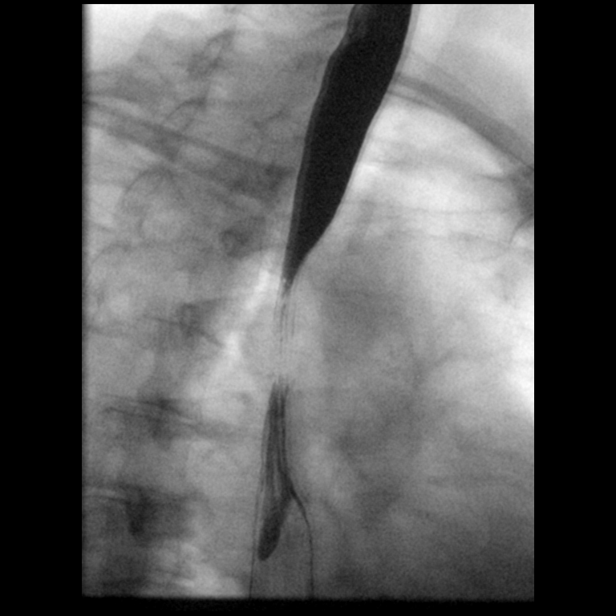
[frame 15/99]
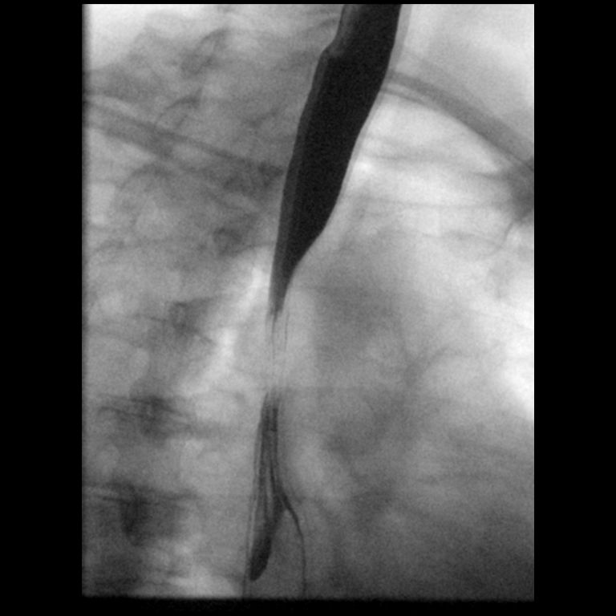
[frame 85/99]
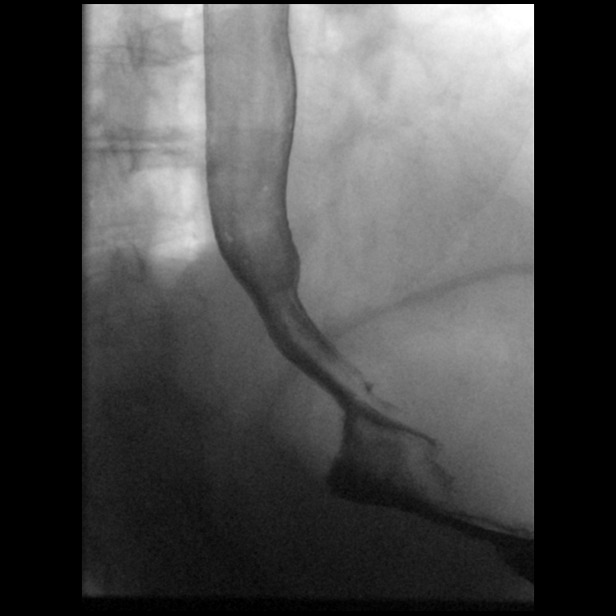

[Series 3: cp_standard · 0.18mm/px · 2 of 129 frames shown (3 of 6)]
[frame 20/129]
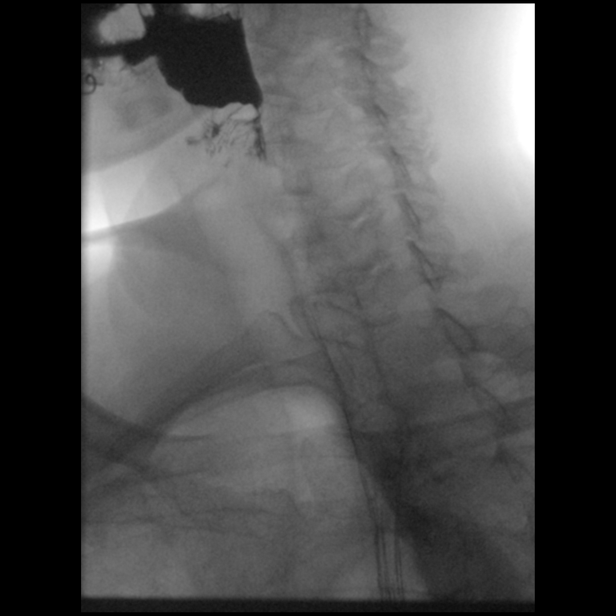
[frame 65/129]
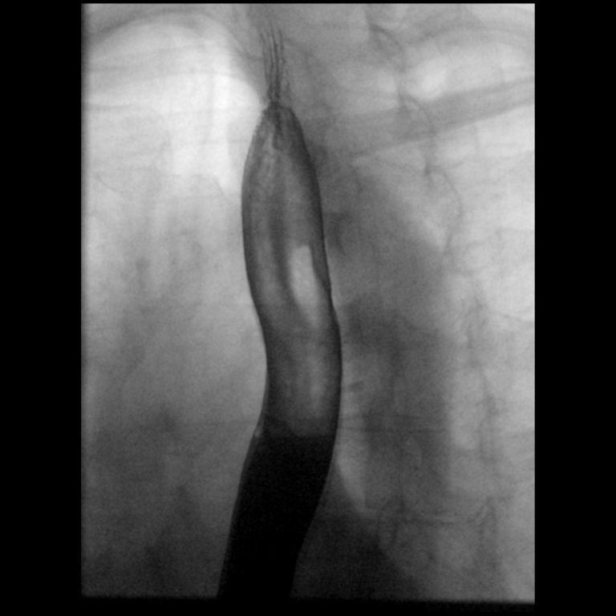

[Series 4: cp_standard · 0.28mm/px · 3 of 209 frames shown (4 of 6)]
[frame 32/209]
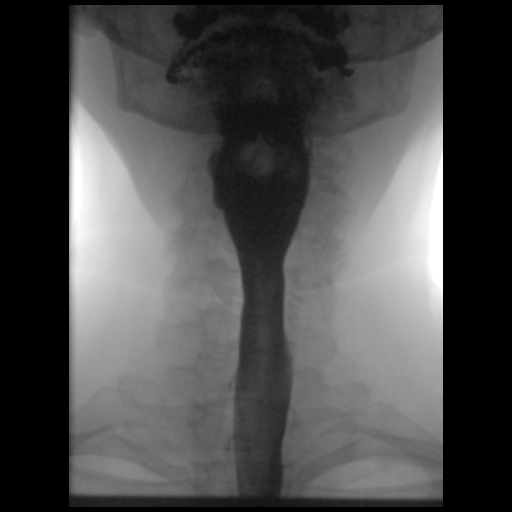
[frame 105/209]
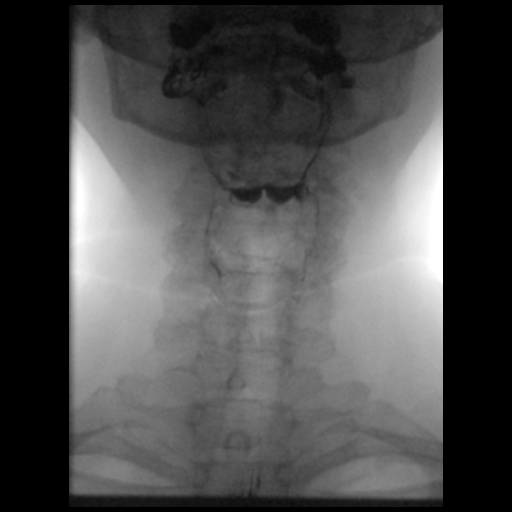
[frame 184/209]
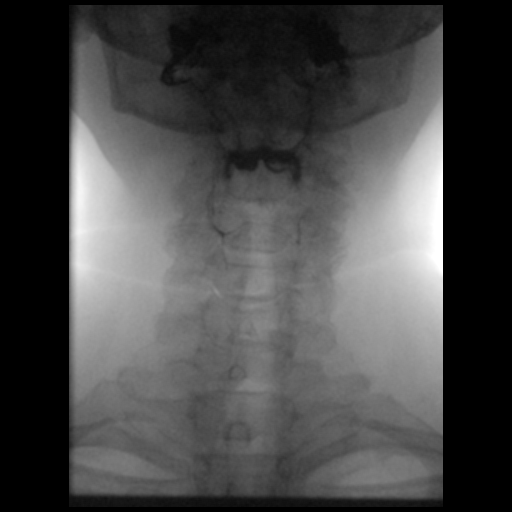

[Series 5: cp_standard · 0.25mm/px · 2 of 115 frames shown (5 of 6)]
[frame 18/115]
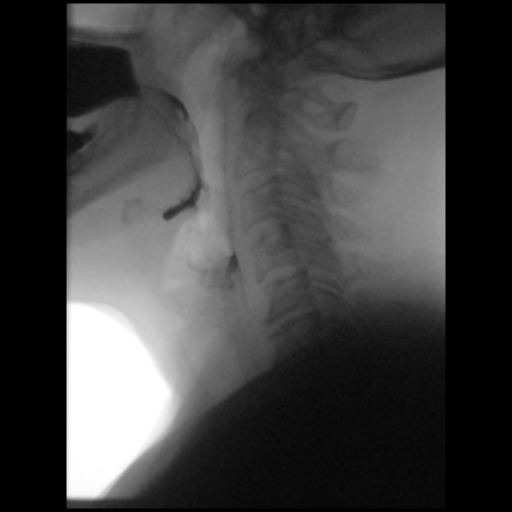
[frame 98/115]
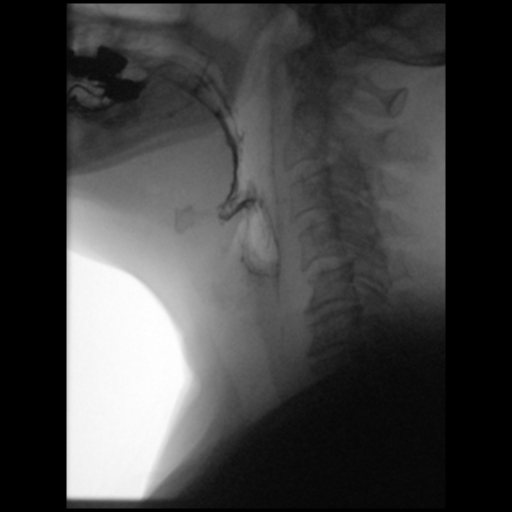

[Series 7: cp_standard · 0.18mm/px · 3 of 192 frames shown (6 of 6)]
[frame 29/192]
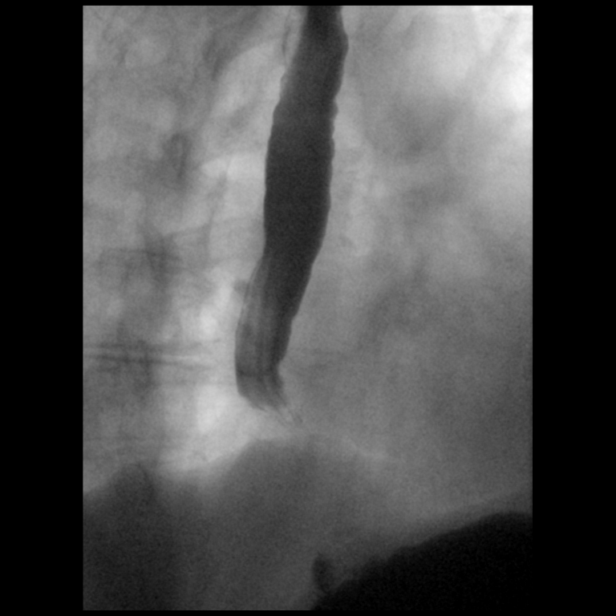
[frame 48/192]
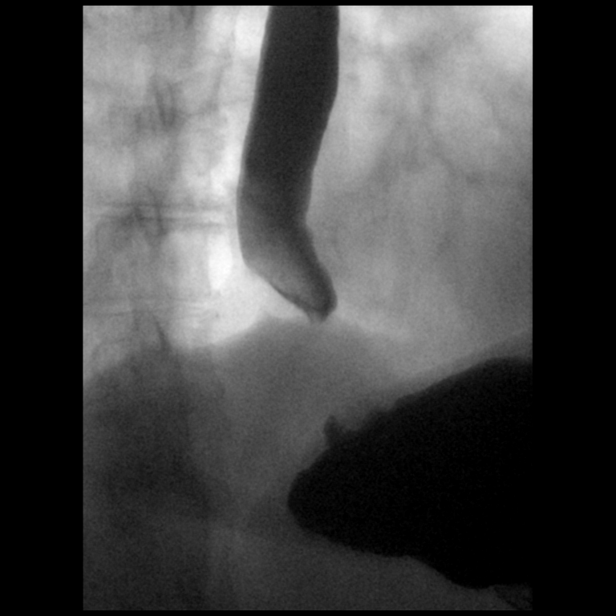
[frame 164/192]
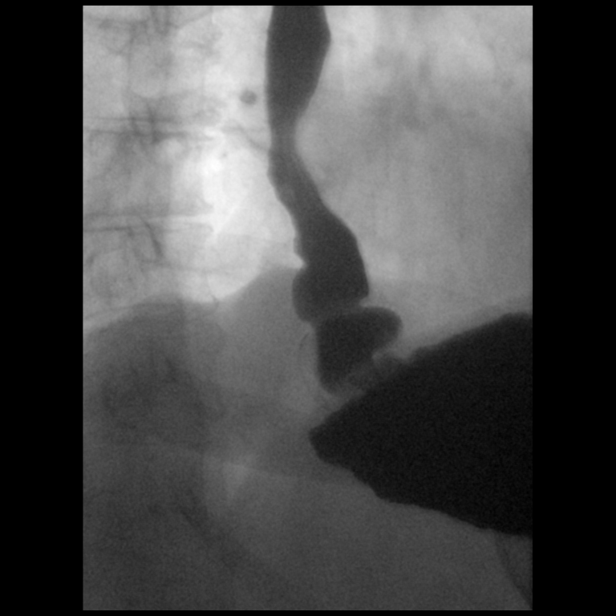

[15 of 24 positions shown; findings below may reference images not displayed]

FINDINGS: Esophageal distention: No focal mass. Relative narrowing at
gastroesophageal junction. Remainder of esophagus distends normally.

Filling defects:  Probable small Schatzki ring at distal esophagus.

12.5 mm barium tablet: Obstructed at gastroesophageal junction and
did not pass despite multiple swallows of water and barium.

Motility:  Age-appropriate

Mucosa:  Smooth without irregularity or ulceration

Hypopharynx/cervical esophagus: No laryngeal penetration or
aspiration. No significant residuals.

Hiatal hernia:  Small sliding hiatal hernia present

GE reflux:  Not witnessed during exam

Other:  N/A
IMPRESSION: Stricture at gastroesophageal junction, obstructing 12.5 mm diameter
barium tablet.

Probable Schatzki ring just above a small sliding hiatal hernia.

## 2020-07-28 ENCOUNTER — Encounter (INDEPENDENT_AMBULATORY_CARE_PROVIDER_SITE_OTHER): Payer: Self-pay | Admitting: *Deleted

## 2020-08-18 ENCOUNTER — Other Ambulatory Visit: Payer: Self-pay | Admitting: Physician Assistant

## 2020-08-18 ENCOUNTER — Other Ambulatory Visit (HOSPITAL_COMMUNITY): Payer: Self-pay | Admitting: Physician Assistant

## 2020-08-18 DIAGNOSIS — G43719 Chronic migraine without aura, intractable, without status migrainosus: Secondary | ICD-10-CM

## 2020-08-28 ENCOUNTER — Other Ambulatory Visit: Payer: Self-pay

## 2020-08-28 ENCOUNTER — Ambulatory Visit (HOSPITAL_COMMUNITY)
Admission: RE | Admit: 2020-08-28 | Discharge: 2020-08-28 | Disposition: A | Discharge status: Home / Self Care | Payer: Medicare Other | Source: Ambulatory Visit | Attending: Physician Assistant | Admitting: Physician Assistant

## 2020-08-28 DIAGNOSIS — G43719 Chronic migraine without aura, intractable, without status migrainosus: Secondary | ICD-10-CM | POA: Diagnosis present

## 2020-08-28 IMAGING — MR MR HEAD WO/W CM
10 of 12 series · 35 of 48 positions shown · IV contrast (gadavist)
Comparison: MR head without and with contrast [DATE]

CLINICAL DATA: Migraine headaches.

EXAM:
MRI HEAD WITHOUT AND WITH CONTRAST
TECHNIQUE: Multiplanar, multiecho pulse sequences of the brain and surrounding
structures were obtained without and with intravenous contrast.
CONTRAST:  7.5mL GADAVIST GADOBUTROL 1 MMOL/ML IV SOLN

[Series 5: DWI · axial · 4.0mm · 0.88mm/px · z∈[-51,+86]mm · 4 of 36 slices shown (1 of 4)]
[im 1/36]
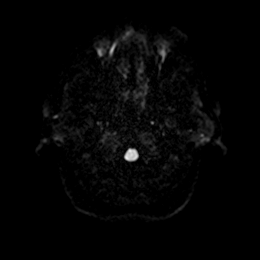
[im 12/36]
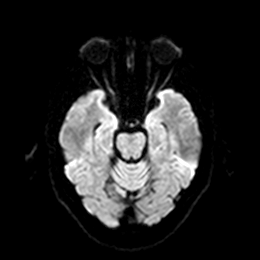
[im 24/36]
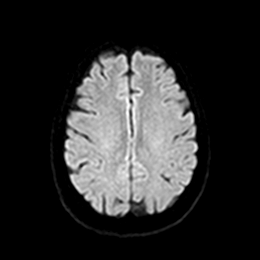
[im 36/36]
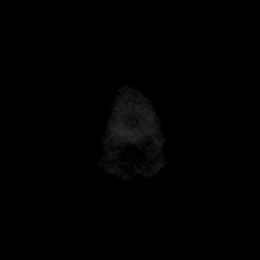

[Series 6: DWI · axial · 4.0mm · 0.88mm/px · z∈[-51,+86]mm · 4 of 36 slices shown (2 of 4)]
[im 1/36]
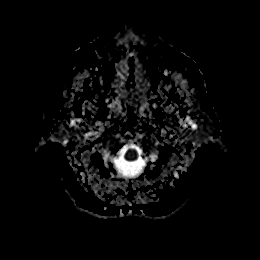
[im 12/36]
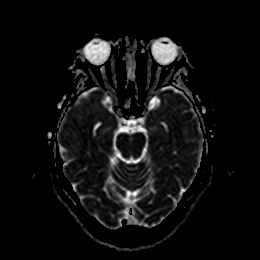
[im 24/36]
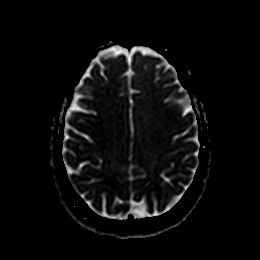
[im 36/36]
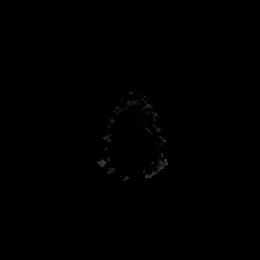

[Series 7: DWI · coronal · 4.0mm · 0.88mm/px · 4 of 32 slices shown (3 of 4)]
[im 1/32]
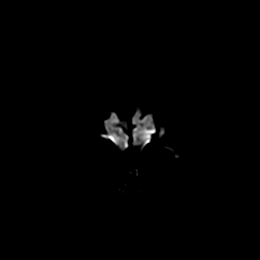
[im 11/32]
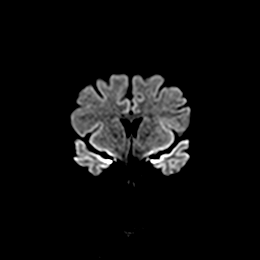
[im 21/32]
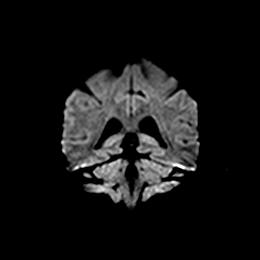
[im 32/32]
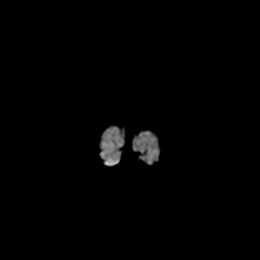

[Series 8: DWI · coronal · 4.0mm · 0.88mm/px · 4 of 32 slices shown (4 of 4)]
[im 1/32]
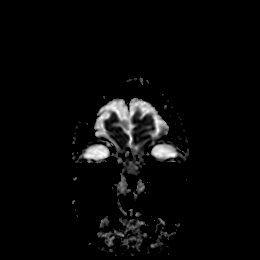
[im 11/32]
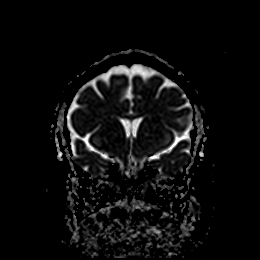
[im 21/32]
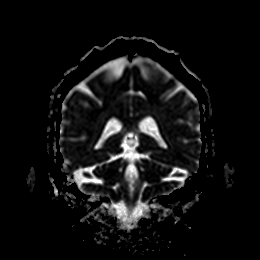
[im 32/32]
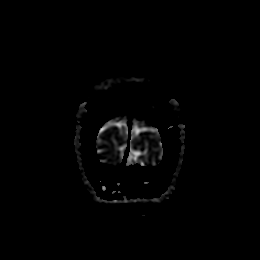

[Series 9: T1 · sagittal · 5.0mm · 0.80mm/px · 3 of 23 slices shown]
[im 1/23]
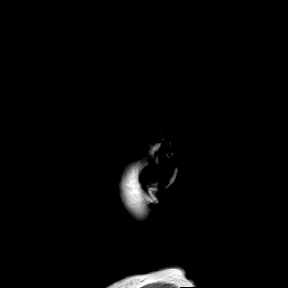
[im 12/23]
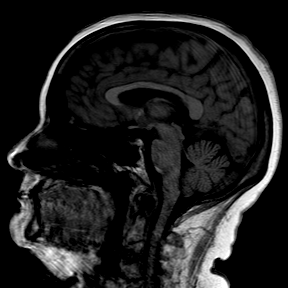
[im 23/23]
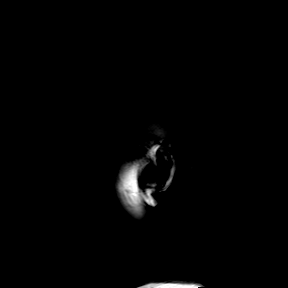

[Series 10: T2 · axial · 5.0mm · 0.72mm/px · z∈[-57,+93]mm · 3 of 23 slices shown (1 of 2)]
[im 1/23]
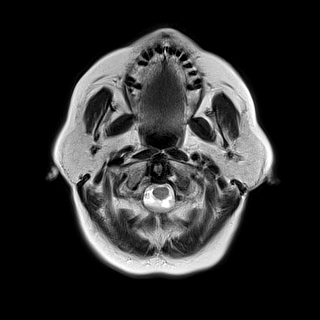
[im 12/23]
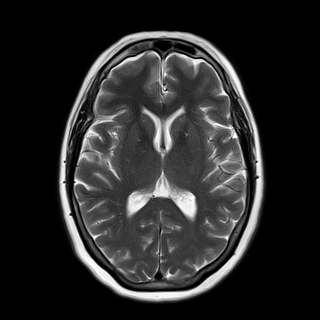
[im 23/23]
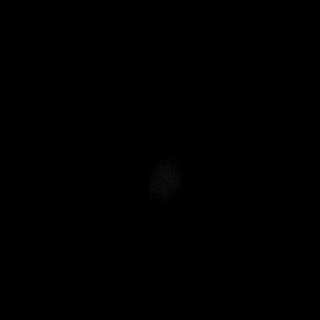

[Series 11: ax hemo · axial · 5.0mm · 0.86mm/px · z∈[-52,+89]mm · 3 of 25 slices shown]
[im 1/25]
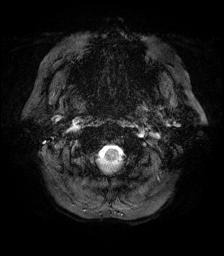
[im 13/25]
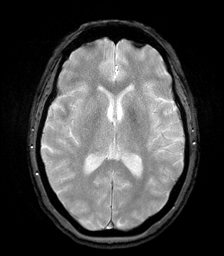
[im 25/25]
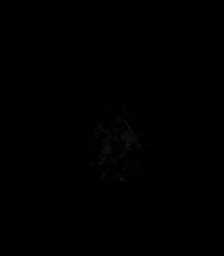

[Series 12: FLAIR · axial · 4.0mm · 0.43mm/px · z∈[-50,+91]mm · 4 of 35 slices shown]
[im 1/35]
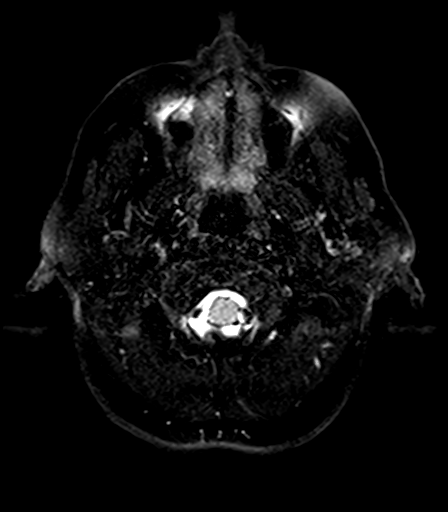
[im 12/35]
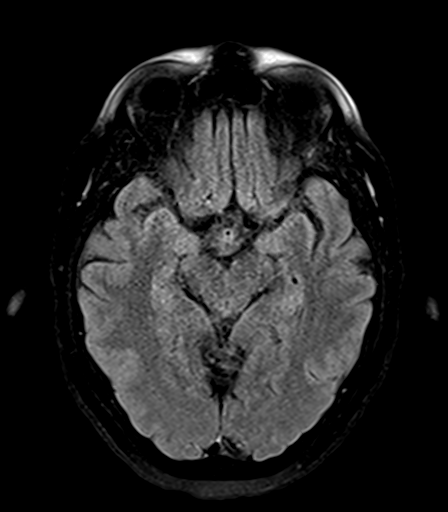
[im 23/35]
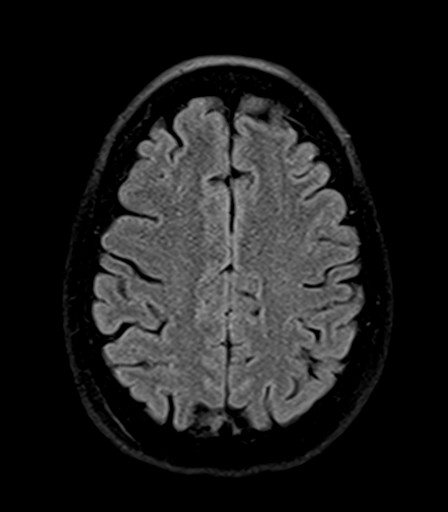
[im 35/35]
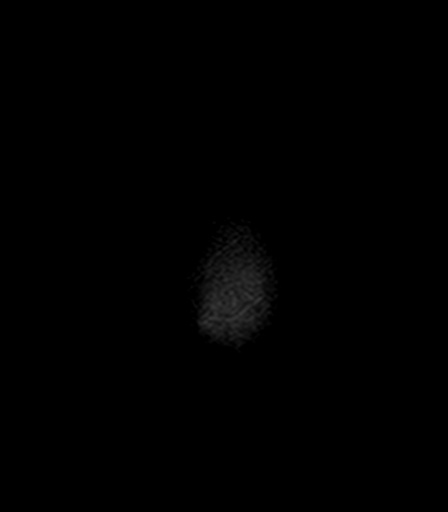

[Series 14: T2 · coronal · 5.0mm · 0.72mm/px · 3 of 28 slices shown (2 of 2)]
[im 1/28]
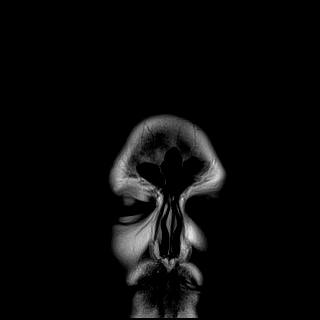
[im 14/28]
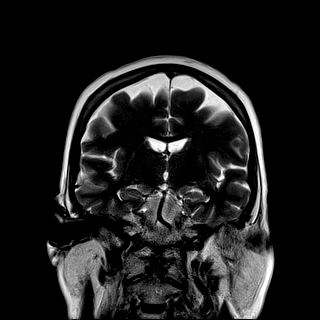
[im 28/28]
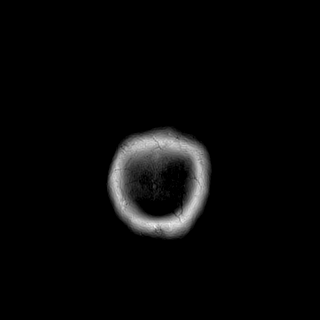

[Series 16: T1 post-contrast · coronal · 5.0mm · 0.34mm/px · 3 of 29 slices shown]
[im 1/29]
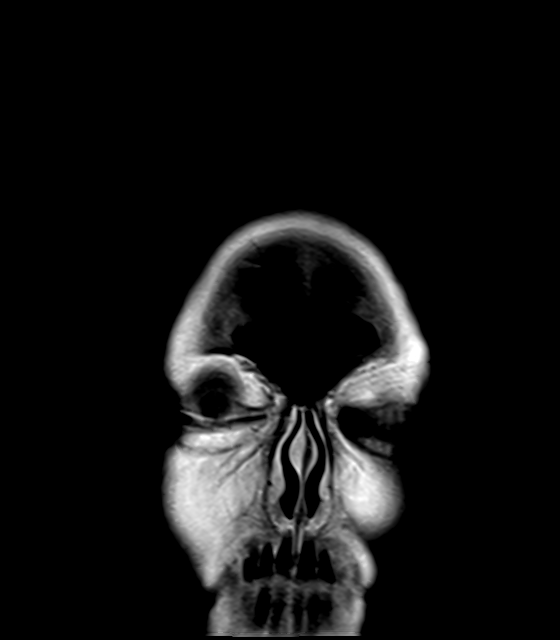
[im 15/29]
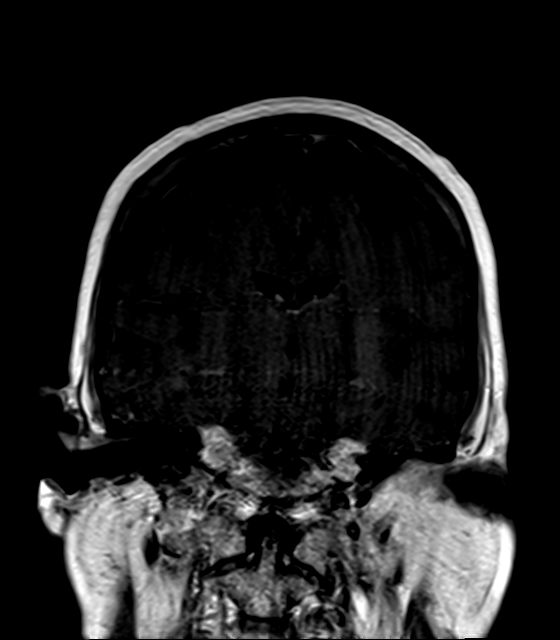
[im 29/29]
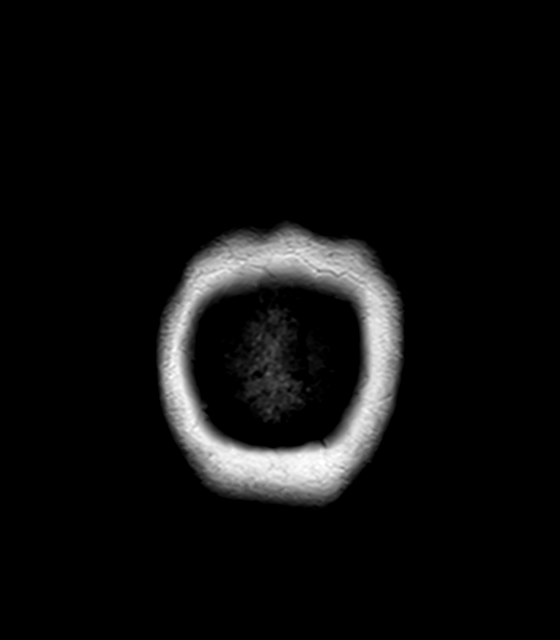

[35 of 48 positions shown; findings below may reference images not displayed]

FINDINGS: Brain: No acute infarct, hemorrhage, or mass lesion is present.
Single punctate subcortical T2 hyperdensity is stable and within
normal limits for age. No other significant white matter lesions are
present. The ventricles are of normal size. No significant
extraaxial fluid collection is present. The internal auditory canals
are within normal limits. The brainstem and cerebellum are within
normal limits.

Vascular: Flow is present in the major intracranial arteries.

Skull and upper cervical spine: The craniocervical junction is
normal. Upper cervical spine is within normal limits. Marrow signal
is unremarkable.

Sinuses/Orbits: A right mastoid effusion is present. No obstructing
nasopharyngeal lesion is present. The paranasal sinuses and mastoid
air cells are otherwise clear. The globes and orbits are within
normal limits.
IMPRESSION: 1. Normal MRI appearance of the brain for age.
2. Right mastoid effusion. No obstructing nasopharyngeal lesion is
present.

## 2020-08-28 MED ORDER — GADOBUTROL 1 MMOL/ML IV SOLN
7.5000 mL | Freq: Once | INTRAVENOUS | Status: AC | PRN
  Administered 2020-08-28: 17:00:00 7.5 mL via INTRAVENOUS

## 2020-10-10 ENCOUNTER — Encounter: Payer: Self-pay | Admitting: Emergency Medicine

## 2020-10-10 ENCOUNTER — Ambulatory Visit
Admission: EM | Admit: 2020-10-10 | Discharge: 2020-10-10 | Disposition: A | Discharge status: Home / Self Care | Payer: Medicare Other | Attending: Family Medicine | Admitting: Family Medicine

## 2020-10-10 ENCOUNTER — Other Ambulatory Visit: Payer: Self-pay

## 2020-10-10 ENCOUNTER — Ambulatory Visit (INDEPENDENT_AMBULATORY_CARE_PROVIDER_SITE_OTHER): Payer: Medicare Other

## 2020-10-10 DIAGNOSIS — Z20822 Contact with and (suspected) exposure to covid-19: Secondary | ICD-10-CM

## 2020-10-10 DIAGNOSIS — J22 Unspecified acute lower respiratory infection: Secondary | ICD-10-CM

## 2020-10-10 DIAGNOSIS — R0602 Shortness of breath: Secondary | ICD-10-CM

## 2020-10-10 DIAGNOSIS — R059 Cough, unspecified: Secondary | ICD-10-CM | POA: Diagnosis not present

## 2020-10-10 IMAGING — DX DG CHEST 2V
2 series · 2 of 2 positions shown · non-contrast
Comparison: [DATE]

CLINICAL DATA: Shortness of breath and productive cough.

EXAM:
CHEST - 2 VIEW

[chest pa]
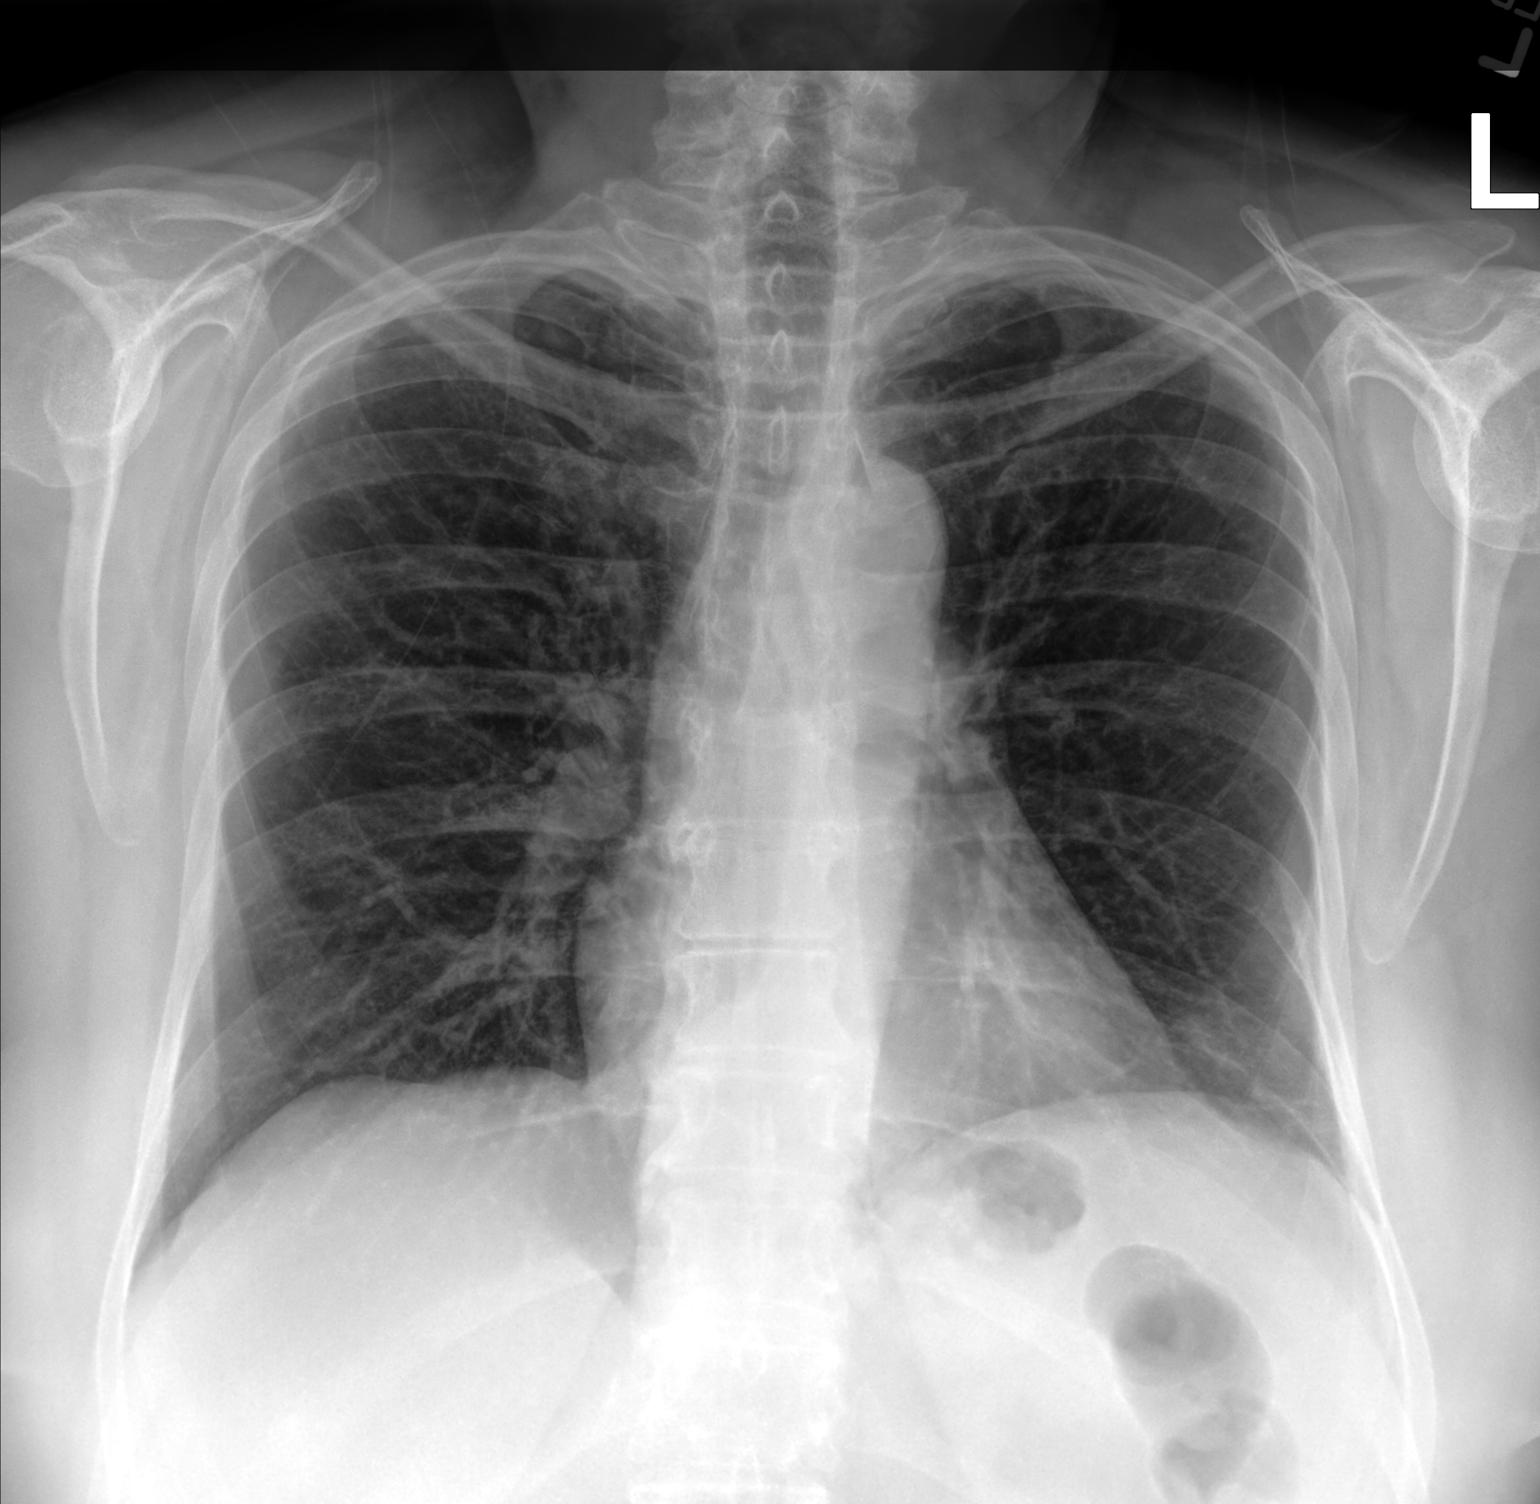

[chest lat]
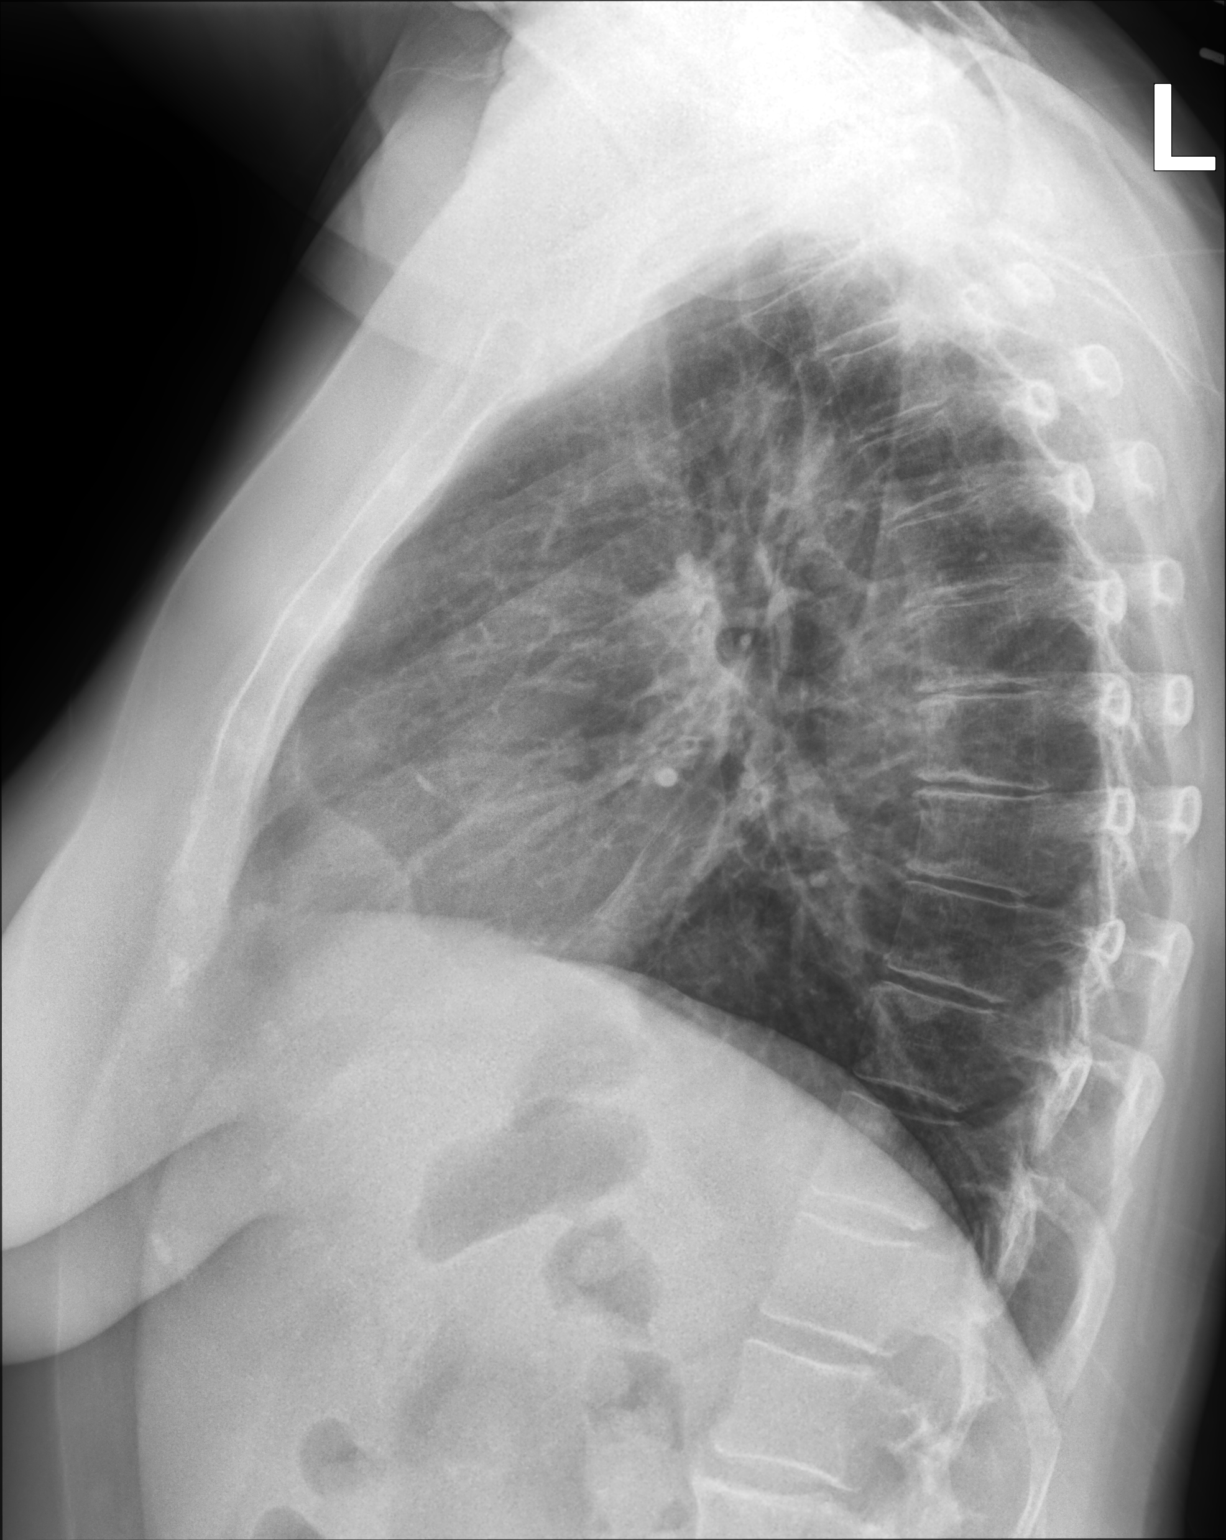

[2 of 2 positions shown; findings below may reference images not displayed]

FINDINGS: The heart size and mediastinal contours are within normal limits.
Potential mild bilateral bronchial thickening in a perihilar
distribution suggestive bronchitis. No hyperinflation. There is no
evidence of pulmonary edema, consolidation, pneumothorax, nodule or
pleural fluid. The visualized skeletal structures are unremarkable.
IMPRESSION: Possible mild bronchitis.

## 2020-10-10 MED ORDER — DOXYCYCLINE HYCLATE 100 MG PO CAPS: 100.0000 mg | ORAL_CAPSULE | Freq: Two times a day (BID) | ORAL | 0 refills | Status: DC

## 2020-10-10 MED ORDER — PSEUDOEPH-BROMPHEN-DM 30-2-10 MG/5ML PO SYRP: 5.0000 mL | ORAL_SOLUTION | Freq: Four times a day (QID) | ORAL | 0 refills | Status: DC | PRN

## 2020-10-10 MED ORDER — DEXAMETHASONE SODIUM PHOSPHATE 10 MG/ML IJ SOLN
10.0000 mg | Freq: Once | INTRAMUSCULAR | Status: AC
  Administered 2020-10-10: 10 mg via INTRAMUSCULAR

## 2020-10-10 MED ORDER — PREDNISONE 20 MG PO TABS: 40.0000 mg | ORAL_TABLET | Freq: Every day | ORAL | 0 refills | Status: DC

## 2020-10-10 NOTE — ED Triage Notes (Signed)
Home covid test negative yesterday,.

## 2020-10-10 NOTE — Discharge Instructions (Addendum)
Your chest x-ray significant for bronchitis only. COVID flu test will result within 2 to 4 days.  I am placing you on prednisone 40 mg once daily for 5 days, doxycycline twice daily for 10 days.  For acute cough Bromfed with pseudoephedrine you may take this up to 4 times per day.  Continue to hydrate well with fluids.  Resume inhaler therapy around-the-clock.  If  shortness of breath or chest pain develops go immediately to the nearest emergency department.

## 2020-10-10 NOTE — ED Triage Notes (Signed)
Cough since yesterday, coughing up green sputum this morning,  chest hurts from coughing.

## 2020-10-10 NOTE — ED Provider Notes (Signed)
[facility]    CSN: [id number] Arrival date & time: 10/10/20  1100      History   Chief Complaint No chief complaint on file.   HPI Meredith Guerra is a 70 y.o. female.   HPI Patient presents today with cough, shortness of breath, nasal congestion Generalized weakness and congestion along with sore throat developed abruptly after returning from vacation yesterday evening.  Patient also has had low-grade temperature.  No known exposures to anyone sick with COVID.  Patient has a long history of pneumonia and bronchitis.  She has inhalers at home that she uses daily however endorses that she has not used consistently while on vacation but has used prior to arrival here at urgent care. Past Medical History:  Diagnosis Date   Asthma    Colitis    Hypertension    Kidney stones    Migraines     There are no problems to display for this patient.   Past Surgical History:  Procedure Laterality Date   ABDOMINAL HYSTERECTOMY     SHOULDER SURGERY     TONSILLECTOMY      OB History   No obstetric history on file.      Home Medications    Prior to Admission medications   Medication Sig Start Date End Date Taking? Authorizing Provider  albuterol (VENTOLIN HFA) 108 (90 Base) MCG/ACT inhaler Inhale 2 puffs into the lungs every 4 (four) hours as needed for wheezing or shortness of breath.    [provider]  amphetamine-dextroamphetamine (ADDERALL) 20 MG tablet Take 20 mg by mouth daily.    [provider]  atorvastatin (LIPITOR) 10 MG tablet Take 10 mg by mouth at bedtime.    [provider]  butalbital-acetaminophen-caffeine (FIORICET) 50-325-40 MG tablet butalbital-acetaminophen-caffeine 50 mg-325 mg-40 mg tablet Patient not taking: Reported on 06/20/2020 03/07/19   [provider]  Cholecalciferol (VITAMIN D-3) 25 MCG (1000 UT) CAPS Take 1 capsule by mouth daily.    [provider]  clonazePAM (KLONOPIN) 0.5 MG tablet Take  0.5 mg by mouth daily.    [provider]  diphenhydrAMINE (BENADRYL) 25 mg capsule Take 25 mg by mouth every 6 (six) hours as needed.    [provider]  DULoxetine (CYMBALTA) 20 MG capsule 20 mg. Take 60 mg every morning and 30 mg every evening 05/20/19   [provider]  Erenumab-aooe (AIMOVIG) 70 MG/ML SOAJ Aimovig Autoinjector 70 mg/mL subcutaneous auto-injector  INJECT (70MG ) BY SUBCUTANEOUS ROUTE EVERY MONTH IN THE ABDOMEN, THIGH, OR OUTER AREA OF UPPER ARM Patient not taking: No sig reported 05/23/19   [provider]  fenofibrate (TRICOR) 145 MG tablet Take 145 mg by mouth daily.    [provider]  levothyroxine (SYNTHROID) 25 MCG tablet Take 25 mcg by mouth See admin instructions. Take 1 and 1/2 every morning    [provider]  loratadine (CLARITIN) 10 MG tablet Take 10 mg by mouth daily.    [provider]  montelukast (SINGULAIR) 10 MG tablet Take 10 mg by mouth at bedtime.    [provider]  pantoprazole (PROTONIX) 20 MG tablet Take 1 tablet (20 mg total) by mouth daily. 06/20/20   08/20/20, PA-C  promethazine (PHENERGAN) 12.5 MG tablet Take 12.5 mg by mouth every 4 (four) hours as needed. 05/20/19   [provider]  propranolol (INDERAL) 80 MG tablet Take 80 mg by mouth 2 (two) times daily. 05/06/19   [provider]  sucralfate (CARAFATE) 1 g tablet Take 1 g by mouth 4 (four) times daily.    [provider]  tiZANidine (ZANAFLEX) 4 MG tablet Take 4 mg by mouth See admin instructions. Take 2 tablets every evening if needed take 1/2 to a whole if headache is bad 05/20/19   [provider]  traZODone (DESYREL) 150 MG tablet Take 150 mg by mouth at bedtime.    [provider]    Family History Family History  Family history unknown: Yes    Social History Social History   Tobacco Use   Smoking status: Never   Smokeless tobacco: Never  Substance Use Topics    Alcohol use: Not Currently   Drug use: Not Currently     Allergies   Compazine [prochlorperazine], Dilaudid [hydromorphone], and Reglan [metoclopramide]   Review of Systems Review of Systems Pertinent negatives listed in HPI  Physical Exam Triage Vital Signs ED Triage Vitals  Enc Vitals Group     BP 10/10/20 1146 (!) 153/84     Pulse Rate 10/10/20 1146 80     Resp 10/10/20 1146 18     Temp 10/10/20 1146 99.4 F (37.4 C)     Temp Source 10/10/20 1146 Temporal     SpO2 10/10/20 1146 92 %     Weight --      Height --      Head Circumference --      Peak Flow --      Pain Score 10/10/20 1147 8     Pain Loc --      Pain Edu? --      Excl. in GC? --    No data found.  Updated Vital Signs BP (!) 153/84 (BP Location: Right Arm)   Pulse 80   Temp 99.4 F (37.4 C) (Temporal)   Resp 18   SpO2 92%   Visual Acuity Right Eye Distance:   Left Eye Distance:   Bilateral Distance:    Right Eye Near:   Left Eye Near:    Bilateral Near:     Physical Exam Constitutional:      Appearance: She is ill-appearing.  HENT:     Head: Normocephalic and atraumatic.     Right Ear: Tympanic membrane normal.     Left Ear: Tympanic membrane normal.     Nose: Congestion and rhinorrhea present.  Eyes:     Conjunctiva/sclera: Conjunctivae normal.  Cardiovascular:     Rate and Rhythm: Normal rate and regular rhythm.  Pulmonary:     Breath sounds: Wheezing and rhonchi present.  Skin:    Capillary Refill: Capillary refill takes less than 2 seconds.  Neurological:     General: No focal deficit present.     Mental Status: She is alert and oriented to person, place, and time.  Psychiatric:        Mood and Affect: Mood normal.        Behavior: Behavior normal.        Thought Content: Thought content normal.        Judgment: Judgment normal.     UC Treatments / Results  Labs (all labs ordered are listed, but only abnormal results are displayed) Labs Reviewed - No data to  display  EKG   Radiology No results found.  Procedures Procedures (including critical care time)  Medications Ordered in UC Medications - No data to display  Initial Impression / Assessment and Plan / UC Course  I have reviewed the triage vital signs  and the nursing notes.  Pertinent labs & imaging results that were available during my care of the patient were reviewed by me and considered in my medical decision making (see chart for details).    Treating for a lower respiratory infection given lung exam was highly abnormal and patient's history of recurrent pneumonia will cover today with doxycycline while COVID/Flu test pending. Manage fever with Tylenol and ibuprofen.  Nasal symptoms with over-the-counter antihistamines recommended.  Treatment per discharge medications/discharge instructions.  Strict ER precautions given as patient's oxygen level is lower than her baseline she is febrile and has a history of recurrent pneumonia.The most current CDC isolation/quarantine recommendation advised.   Final Clinical Impressions(s) / UC Diagnoses   Final diagnoses:  Lower respiratory infection (e.g., bronchitis, pneumonia, pneumonitis, pulmonitis)  Encounter for laboratory testing for COVID-19 virus     Discharge Instructions      Your chest x-ray significant for bronchitis only. COVID flu test will result within 2 to 4 days.  I am placing you on prednisone 40 mg once daily for 5 days, doxycycline twice daily for 10 days.  For acute cough Bromfed with pseudoephedrine you may take this up to 4 times per day.  Continue to hydrate well with fluids.  Resume inhaler therapy around-the-clock.  If  shortness of breath or chest pain develops go immediately to the nearest emergency department.    ED Prescriptions     Medication Sig Dispense Auth. Provider   predniSONE (DELTASONE) 20 MG tablet Take 2 tablets (40 mg total) by mouth daily with breakfast. 10 tablet Bing Neighbors, FNP    doxycycline (VIBRAMYCIN) 100 MG capsule Take 1 capsule (100 mg total) by mouth 2 (two) times daily. 20 capsule Bing Neighbors, FNP   brompheniramine-pseudoephedrine-DM 30-2-10 MG/5ML syrup Take 5 mLs by mouth 4 (four) times daily as needed. 140 mL Bing Neighbors, FNP      PDMP not reviewed this encounter.   Bing Neighbors, FNP 10/12/20 2010

## 2020-10-11 LAB — COVID-19, FLU A+B NAA
Influenza A, NAA: NOT DETECTED
Influenza B, NAA: NOT DETECTED
SARS-CoV-2, NAA: NOT DETECTED

## 2020-10-12 ENCOUNTER — Ambulatory Visit (INDEPENDENT_AMBULATORY_CARE_PROVIDER_SITE_OTHER): Payer: PRIVATE HEALTH INSURANCE | Admitting: Gastroenterology

## 2021-01-28 ENCOUNTER — Ambulatory Visit (INDEPENDENT_AMBULATORY_CARE_PROVIDER_SITE_OTHER): Payer: Medicare Other | Admitting: Gastroenterology

## 2021-01-28 ENCOUNTER — Other Ambulatory Visit: Payer: Self-pay

## 2021-01-28 ENCOUNTER — Encounter (INDEPENDENT_AMBULATORY_CARE_PROVIDER_SITE_OTHER): Payer: Self-pay | Admitting: Gastroenterology

## 2021-01-28 DIAGNOSIS — R1032 Left lower quadrant pain: Secondary | ICD-10-CM | POA: Diagnosis not present

## 2021-01-28 DIAGNOSIS — K529 Noninfective gastroenteritis and colitis, unspecified: Secondary | ICD-10-CM

## 2021-01-28 DIAGNOSIS — R197 Diarrhea, unspecified: Secondary | ICD-10-CM

## 2021-01-28 DIAGNOSIS — R12 Heartburn: Secondary | ICD-10-CM

## 2021-01-28 DIAGNOSIS — R1319 Other dysphagia: Secondary | ICD-10-CM | POA: Diagnosis not present

## 2021-01-28 DIAGNOSIS — R1310 Dysphagia, unspecified: Secondary | ICD-10-CM | POA: Insufficient documentation

## 2021-01-28 NOTE — Patient Instructions (Addendum)
Call us back to schedule your EGD and colonoscopy once your oral surgeon has cleared you to have a bite block during the procedure Perform blood workup Perform stool workup Can take Imodium as needed for diarrhea Take two Protonix pills in the AM (20 mg x2) and stop the night dose Explained presumed etiology of reflux symptoms. Instruction provided in the use of antireflux medication - patient should take medication in the morning 30-45 minutes before eating breakfast. Discussed avoidance of eating within 2 hours of lying down to sleep and benefit of blocks to elevate head of bed. Also, will benefit from avoiding carbonated drinks/sodas or food that has tomatoes, spicy or greasy food.

## 2021-01-28 NOTE — Progress Notes (Signed)
Jenelle Mages, M.D. Gastroenterology & Hepatology [facility] [address] [place], [place] [address] Primary Care Physician: Elfredia Nevins, MD [address] [place] [address]  Referring MD: PCP  Chief Complaint: Diarrhea, abdominal pain, dysphagia and heartburn  History of Present Illness: Meredith Guerra is a 70 y.o. female with past medical history of c. Diff colitis in 2002, asthma, hypertension and migraines, who presents for evaluation of diarrhea, abdominal pain, dysphagia and heartburn.  Patient had a mouth surgery today and can barely talk. Most of the information is provided by handwritting communication.  Patient reports that she has presented episodes of hearburn for 1 year.she describes a burning sensation starts in her sternal area and goes to the epigastric region, as well as it radiates to the rest of her chest. She reports having the symptoms on a daily basis.  Heartburn is present no matter what she eats. She states having the heartburn 90-95% of the day. No nighttime symptoms.  She has been taking Protonix 20 mg, started taking it in May 2022 and went up on the dosing to twice a day on 4-5 months ago.  Has felt the symptoms are better win severity and frequency with the PPI, and the twice a day dosing is better than once a day dosing.  States that 7-8 months ago her food is not going down as it should - states it happens both with solids and liquids.  Reports a frequent sensation of food getting stuck in the middle of her chest.  Does not happen with every meal but happens 50-75% of the time. She has frequent choking spells as well. Tries to eat slowly to avoid this. Sometimes feels" the saliva will not go down her throat" even when not eating.  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 Probable  Schatzki ring just above a small sliding hiatal hernia.  The patient reports having diarrhea for the last 7-8 months. She reports having 6-8 bowel movements per day 5/7 days a week, which is usually watery in consistency. No melena or hematochezia. The days she does not have diarrhea, she has 1 BM a day. For her diarrhea, she takes 1 pill of Imdoium when "it gets very bad". Used to use it 3-4 times a week but now only once week - decreased the frequency only if she feels "very bad" or has to go out somewehre, but diarrhea has not ben getting better. Does not take magnesium supplements or any other OTC beside Vitamin D.   She also reports having pain in the LLQ for the last , which happens once a month (has been going on for the last year) and alsts for 2-3 days. She describes that the pain is sharp and does not go anywehre else.   Has also presented frequent nausea with very occasional vomiting episodes.  The patient denies having any fever, chills, hematochezia, melena, hematemesis, diarrhea, jaundice, pruritus or weight loss (has been gaining weight).  Last BSW:HQPRF. Last Colonoscopy:2012 - normal  FHx: neg for any gastrointestinal/liver disease, father renal cancer Social: neg smoking, alcohol or illicit drug use Surgical: hysterectomy  Past Medical History: Past Medical History:  Diagnosis Date   Asthma    Colitis    Hypertension    Kidney stones    Migraines     Past Surgical History: Past Surgical History:  Procedure Laterality Date  ABDOMINAL HYSTERECTOMY     SHOULDER SURGERY     TONSILLECTOMY      Family History: Family History  Family history unknown: Yes    Social History: Social History   Tobacco Use  Smoking Status Never  Smokeless Tobacco Never   Social History   Substance and Sexual Activity  Alcohol Use Not Currently   Social History   Substance and Sexual Activity  Drug Use Not Currently    Allergies: Allergies  Allergen Reactions    Compazine [Prochlorperazine] Other (See Comments)   Dilaudid [Hydromorphone] Other (See Comments)   Reglan [Metoclopramide] Other (See Comments)    Medications: Current Outpatient Medications  Medication Sig Dispense Refill   albuterol (VENTOLIN HFA) 108 (90 Base) MCG/ACT inhaler Inhale 2 puffs into the lungs every 4 (four) hours as needed for wheezing or shortness of breath.     amphetamine-dextroamphetamine (ADDERALL) 20 MG tablet Take 20 mg by mouth daily.     atorvastatin (LIPITOR) 10 MG tablet Take 10 mg by mouth at bedtime.     butalbital-acetaminophen-caffeine (FIORICET) 50-325-40 MG tablet butalbital-acetaminophen-caffeine 50 mg-325 mg-40 mg tablet (Patient not taking: Reported on 06/20/2020)     Cholecalciferol (VITAMIN D-3) 25 MCG (1000 UT) CAPS Take 1 capsule by mouth daily.     clonazePAM (KLONOPIN) 0.5 MG tablet Take 0.5 mg by mouth daily.     diphenhydrAMINE (BENADRYL) 25 mg capsule Take 25 mg by mouth every 6 (six) hours as needed.     DULoxetine (CYMBALTA) 20 MG capsule 20 mg. Take 60 mg every morning and 30 mg every evening     fenofibrate (TRICOR) 145 MG tablet Take 145 mg by mouth daily.     levothyroxine (SYNTHROID) 25 MCG tablet Take 25 mcg by mouth See admin instructions. Take 1 and 1/2 every morning     loratadine (CLARITIN) 10 MG tablet Take 10 mg by mouth daily.     montelukast (SINGULAIR) 10 MG tablet Take 10 mg by mouth at bedtime.     pantoprazole (PROTONIX) 20 MG tablet Take 1 tablet (20 mg total) by mouth daily. 30 tablet 1   predniSONE (DELTASONE) 20 MG tablet Take 2 tablets (40 mg total) by mouth daily with breakfast. 10 tablet 0   promethazine (PHENERGAN) 12.5 MG tablet Take 12.5 mg by mouth every 4 (four) hours as needed.     propranolol (INDERAL) 80 MG tablet Take 80 mg by mouth 2 (two) times daily.     sucralfate (CARAFATE) 1 g tablet Take 1 g by mouth 4 (four) times daily.     tiZANidine (ZANAFLEX) 4 MG tablet Take 4 mg by mouth See admin instructions. Take  2 tablets every evening if needed take 1/2 to a whole if headache is bad     traZODone (DESYREL) 150 MG tablet Take 150 mg by mouth at bedtime.     No current facility-administered medications for this visit.    Review of Systems: GENERAL: negative for malaise, night sweats HEENT: No changes in hearing or vision, no nose bleeds or other nasal problems. NECK: Negative for lumps, goiter, pain and significant neck swelling RESPIRATORY: Negative for cough, wheezing CARDIOVASCULAR: Negative for chest pain, leg swelling, palpitations, orthopnea GI: SEE HPI MUSCULOSKELETAL: Negative for joint pain or swelling, back pain, and muscle pain. SKIN: Negative for lesions, rash PSYCH: Negative for sleep disturbance, mood disorder and recent psychosocial stressors. HEMATOLOGY Negative for prolonged bleeding, bruising easily, and swollen nodes. ENDOCRINE: Negative for cold or heat intolerance, polyuria, polydipsia and goiter.  NEURO: negative for tremor, gait imbalance, syncope and seizures. The remainder of the review of systems is noncontributory.   Physical Exam: BP (!) 141/64 (BP Location: Right Arm, Patient Position: Sitting, Cuff Size: Large)   Pulse 60   Ht 5\' 3"  (1.6 m)   Wt 170 lb 8 oz (77.3 kg)   BMI 30.20 kg/m  GENERAL: The patient is AO x3, in no acute distress. HEENT: Head is normocephalic and atraumatic. EOMI are intact. Mouth is well hydrated and without lesions. NECK: Supple. No masses LUNGS: Clear to auscultation. No presence of rhonchi/wheezing/rales. Adequate chest expansion HEART: RRR, normal s1 and s2. ABDOMEN: tender to palpation in the LLQ and epigastric area, no guarding, no peritoneal signs, and nondistended. BS +. No masses. EXTREMITIES: Without any cyanosis, clubbing, rash, lesions or edema. NEUROLOGIC: AOx3, no focal motor deficit. SKIN: no jaundice, no rashes   Imaging/Labs: as above  I personally reviewed and interpreted the available labs, imaging and  endoscopic files.  Impression and Plan: Meredith Guerra is a 70 y.o. female with past medical history of c. Diff colitis in 2002, asthma, hypertension and migraines, who presents for evaluation of diarrhea, abdominal pain, dysphagia and heartburn.  Patient has presented chronic episodes of dysphagia with evidence on imaging suggestive of a stricture at the GE junction and/or possible Schatzki's ring.  It is possible she has developed this due to her frequent heartburn episodes (possibly related to GERD) which will need to be further explored with an EGD.  However she had a dental procedure today.  I requested her to talk to her oral surgeon to determine when she can have a bite block in her mouth to schedule her esophagogastroduodenospy.  She agreed with this.  On the other hand, the patient has presented persistent heartburn despite taking pantoprazole twice a day.  I advised her on the right way to take the medication but also she would benefit of taking 40 mg dosing in the morning only.  Family findings on the esophagogastroduodenospy we will determine if she needs a higher dose or if further testing with a pH impedance testing should be performed off PPI treatment.  Regarding her chronic diarrhea, she has not presented any red flag signs.  Diarrhea is not present every day but we will need to evaluate for infectious causes with stool testing, we will also check metabolic etiologies with a CBC, CMP, TSH and fecal fat. Will also check celic serologies.  As she is due for colorectal cancer screening, will also schedule colonoscopy at the same time of her esophagogastroduodenospy, will need to obtain random colonic biopsies at that time.  She can take Imodium as needed to improve her diarrhea symptoms.   - Patient to call back to schedule your EGD and colonoscopy once her oral surgeon has cleared her to have a bite block during the procedure -Check CBC, CMP, TSH, celiac serologies -Check C. difficile, GI  pathogen, ova and parasite and fecal fat - Can take Imodium as needed for diarrhea - Take two Protonix pills in the AM (20 mg x2) and stop the night dose - Explained presumed etiology of reflux symptoms. Instruction provided in the use of antireflux medication - patient should take medication in the morning 30-45 minutes before eating breakfast. Discussed avoidance of eating within 2 hours of lying down to sleep and benefit of blocks to elevate head of bed. Also, will benefit from avoiding carbonated drinks/sodas or food that has tomatoes, spicy or greasy food. - RTC 3  months  All questions were answered.     Time  spent during face to face encounter: 50 minutes  Maylon Peppers, MD Gastroenterology and Hepatology [facility]

## 2021-01-29 LAB — CBC WITH DIFFERENTIAL/PLATELET
Absolute Monocytes: 131 cells/uL — ABNORMAL LOW (ref 200–950)
Basophils Absolute: 40 cells/uL (ref 0–200)
Basophils Relative: 0.4 %
Eosinophils Absolute: 30 cells/uL (ref 15–500)
Eosinophils Relative: 0.3 %
HCT: 44.4 % (ref 35.0–45.0)
Hemoglobin: 14.8 g/dL (ref 11.7–15.5)
Lymphs Abs: 808 cells/uL — ABNORMAL LOW (ref 850–3900)
MCH: 28.6 pg (ref 27.0–33.0)
MCHC: 33.3 g/dL (ref 32.0–36.0)
MCV: 85.9 fL (ref 80.0–100.0)
MPV: 12 fL (ref 7.5–12.5)
Monocytes Relative: 1.3 %
Neutro Abs: 9090 cells/uL — ABNORMAL HIGH (ref 1500–7800)
Neutrophils Relative %: 90 %
Platelets: 237 10*3/uL (ref 140–400)
RBC: 5.17 10*6/uL — ABNORMAL HIGH (ref 3.80–5.10)
RDW: 13.8 % (ref 11.0–15.0)
Total Lymphocyte: 8 %
WBC: 10.1 10*3/uL (ref 3.8–10.8)

## 2021-01-29 LAB — COMPREHENSIVE METABOLIC PANEL
AG Ratio: 1.8 (calc) (ref 1.0–2.5)
ALT: 26 U/L (ref 6–29)
AST: 25 U/L (ref 10–35)
Albumin: 4.9 g/dL (ref 3.6–5.1)
Alkaline phosphatase (APISO): 99 U/L (ref 37–153)
BUN: 16 mg/dL (ref 7–25)
CO2: 25 mmol/L (ref 20–32)
Calcium: 10.1 mg/dL (ref 8.6–10.4)
Chloride: 102 mmol/L (ref 98–110)
Creat: 1.01 mg/dL (ref 0.50–1.05)
Globulin: 2.7 g/dL (calc) (ref 1.9–3.7)
Glucose, Bld: 143 mg/dL — ABNORMAL HIGH (ref 65–99)
Potassium: 4.5 mmol/L (ref 3.5–5.3)
Sodium: 142 mmol/L (ref 135–146)
Total Bilirubin: 0.5 mg/dL (ref 0.2–1.2)
Total Protein: 7.6 g/dL (ref 6.1–8.1)

## 2021-01-29 LAB — TSH: TSH: 2.84 mIU/L (ref 0.40–4.50)

## 2021-01-29 LAB — CELIAC DISEASE PANEL
(tTG) Ab, IgA: <1 U/mL
(tTG) Ab, IgG: <1 U/mL
Gliadin IgA: 1.4 U/mL
Gliadin IgG: <1 U/mL
Immunoglobulin A: 186 mg/dL (ref 70–320)

## 2021-03-29 ENCOUNTER — Inpatient Hospital Stay (HOSPITAL_COMMUNITY)
Admission: EM | Disposition: A | Discharge status: Home Health | Payer: Self-pay | Source: Home / Self Care | Attending: Cardiology

## 2021-03-29 ENCOUNTER — Inpatient Hospital Stay (HOSPITAL_COMMUNITY)
Admission: EM | Admit: 2021-03-29 | Discharge: 2021-04-12 | DRG: 233 | Disposition: A | Discharge status: Home Health | Payer: Medicare Other | Attending: Cardiothoracic Surgery | Admitting: Cardiothoracic Surgery

## 2021-03-29 ENCOUNTER — Emergency Department (HOSPITAL_COMMUNITY): Payer: Medicare Other

## 2021-03-29 ENCOUNTER — Encounter (HOSPITAL_COMMUNITY): Payer: Self-pay

## 2021-03-29 ENCOUNTER — Inpatient Hospital Stay (HOSPITAL_COMMUNITY): Payer: Medicare Other

## 2021-03-29 DIAGNOSIS — I2510 Atherosclerotic heart disease of native coronary artery without angina pectoris: Secondary | ICD-10-CM | POA: Diagnosis present

## 2021-03-29 DIAGNOSIS — J45909 Unspecified asthma, uncomplicated: Secondary | ICD-10-CM | POA: Diagnosis present

## 2021-03-29 DIAGNOSIS — J1282 Pneumonia due to coronavirus disease 2019: Secondary | ICD-10-CM | POA: Diagnosis present

## 2021-03-29 DIAGNOSIS — J129 Viral pneumonia, unspecified: Secondary | ICD-10-CM

## 2021-03-29 DIAGNOSIS — I4891 Unspecified atrial fibrillation: Secondary | ICD-10-CM | POA: Diagnosis not present

## 2021-03-29 DIAGNOSIS — I10 Essential (primary) hypertension: Secondary | ICD-10-CM | POA: Diagnosis present

## 2021-03-29 DIAGNOSIS — E782 Mixed hyperlipidemia: Secondary | ICD-10-CM | POA: Diagnosis present

## 2021-03-29 DIAGNOSIS — I25110 Atherosclerotic heart disease of native coronary artery with unstable angina pectoris: Secondary | ICD-10-CM | POA: Diagnosis not present

## 2021-03-29 DIAGNOSIS — Z79890 Hormone replacement therapy (postmenopausal): Secondary | ICD-10-CM | POA: Diagnosis not present

## 2021-03-29 DIAGNOSIS — Z888 Allergy status to other drugs, medicaments and biological substances status: Secondary | ICD-10-CM | POA: Diagnosis not present

## 2021-03-29 DIAGNOSIS — Z90710 Acquired absence of both cervix and uterus: Secondary | ICD-10-CM

## 2021-03-29 DIAGNOSIS — D62 Acute posthemorrhagic anemia: Secondary | ICD-10-CM | POA: Diagnosis not present

## 2021-03-29 DIAGNOSIS — Z87442 Personal history of urinary calculi: Secondary | ICD-10-CM

## 2021-03-29 DIAGNOSIS — G43909 Migraine, unspecified, not intractable, without status migrainosus: Secondary | ICD-10-CM | POA: Diagnosis present

## 2021-03-29 DIAGNOSIS — I213 ST elevation (STEMI) myocardial infarction of unspecified site: Secondary | ICD-10-CM | POA: Diagnosis present

## 2021-03-29 DIAGNOSIS — F32A Depression, unspecified: Secondary | ICD-10-CM | POA: Diagnosis present

## 2021-03-29 DIAGNOSIS — R200 Anesthesia of skin: Secondary | ICD-10-CM | POA: Diagnosis not present

## 2021-03-29 DIAGNOSIS — Z23 Encounter for immunization: Secondary | ICD-10-CM

## 2021-03-29 DIAGNOSIS — Z885 Allergy status to narcotic agent status: Secondary | ICD-10-CM

## 2021-03-29 DIAGNOSIS — I428 Other cardiomyopathies: Secondary | ICD-10-CM | POA: Diagnosis not present

## 2021-03-29 DIAGNOSIS — R079 Chest pain, unspecified: Secondary | ICD-10-CM | POA: Diagnosis present

## 2021-03-29 DIAGNOSIS — T38895A Adverse effect of other hormones and synthetic substitutes, initial encounter: Secondary | ICD-10-CM | POA: Diagnosis not present

## 2021-03-29 DIAGNOSIS — Z79899 Other long term (current) drug therapy: Secondary | ICD-10-CM | POA: Diagnosis not present

## 2021-03-29 DIAGNOSIS — E871 Hypo-osmolality and hyponatremia: Secondary | ICD-10-CM | POA: Diagnosis not present

## 2021-03-29 DIAGNOSIS — R0602 Shortness of breath: Secondary | ICD-10-CM

## 2021-03-29 DIAGNOSIS — E039 Hypothyroidism, unspecified: Secondary | ICD-10-CM | POA: Diagnosis present

## 2021-03-29 DIAGNOSIS — I255 Ischemic cardiomyopathy: Secondary | ICD-10-CM | POA: Diagnosis present

## 2021-03-29 DIAGNOSIS — Z6829 Body mass index (BMI) 29.0-29.9, adult: Secondary | ICD-10-CM

## 2021-03-29 DIAGNOSIS — U071 COVID-19: Secondary | ICD-10-CM

## 2021-03-29 DIAGNOSIS — E669 Obesity, unspecified: Secondary | ICD-10-CM | POA: Diagnosis present

## 2021-03-29 DIAGNOSIS — Z951 Presence of aortocoronary bypass graft: Secondary | ICD-10-CM

## 2021-03-29 DIAGNOSIS — D696 Thrombocytopenia, unspecified: Secondary | ICD-10-CM | POA: Diagnosis not present

## 2021-03-29 DIAGNOSIS — Z01810 Encounter for preprocedural cardiovascular examination: Secondary | ICD-10-CM | POA: Diagnosis not present

## 2021-03-29 DIAGNOSIS — I249 Acute ischemic heart disease, unspecified: Secondary | ICD-10-CM | POA: Diagnosis present

## 2021-03-29 HISTORY — PX: LEFT HEART CATH AND CORONARY ANGIOGRAPHY: CATH118249

## 2021-03-29 LAB — BASIC METABOLIC PANEL
Anion gap: 12 (ref 5–15)
BUN: 15 mg/dL (ref 8–23)
CO2: 27 mmol/L (ref 22–32)
Calcium: 9.7 mg/dL (ref 8.9–10.3)
Chloride: 100 mmol/L (ref 98–111)
Creatinine, Ser: 1.08 mg/dL — ABNORMAL HIGH (ref 0.44–1.00)
GFR, Estimated: 56 mL/min — ABNORMAL LOW (ref >60)
Glucose, Bld: 150 mg/dL — ABNORMAL HIGH (ref 70–99)
Potassium: 3.6 mmol/L (ref 3.5–5.1)
Sodium: 139 mmol/L (ref 135–145)

## 2021-03-29 LAB — CBC
HCT: 45.4 % (ref 36.0–46.0)
Hemoglobin: 15.5 g/dL — ABNORMAL HIGH (ref 12.0–15.0)
MCH: 29.4 pg (ref 26.0–34.0)
MCHC: 34.1 g/dL (ref 30.0–36.0)
MCV: 86 fL (ref 80.0–100.0)
Platelets: 332 10*3/uL (ref 150–400)
RBC: 5.28 MIL/uL — ABNORMAL HIGH (ref 3.87–5.11)
RDW: 14.1 % (ref 11.5–15.5)
WBC: 8.8 10*3/uL (ref 4.0–10.5)
nRBC: 0 % (ref 0.0–0.2)

## 2021-03-29 LAB — TROPONIN I (HIGH SENSITIVITY): Troponin I (High Sensitivity): 392 ng/L (ref <18)

## 2021-03-29 LAB — MAGNESIUM: Magnesium: 1.4 mg/dL — ABNORMAL LOW (ref 1.7–2.4)

## 2021-03-29 IMAGING — DX DG CHEST 1V PORT
1 series · 1 of 1 positions shown · non-contrast
Comparison: [DATE]

CLINICAL DATA: Chest pain

EXAM:
PORTABLE CHEST 1 VIEW

[chest ap]
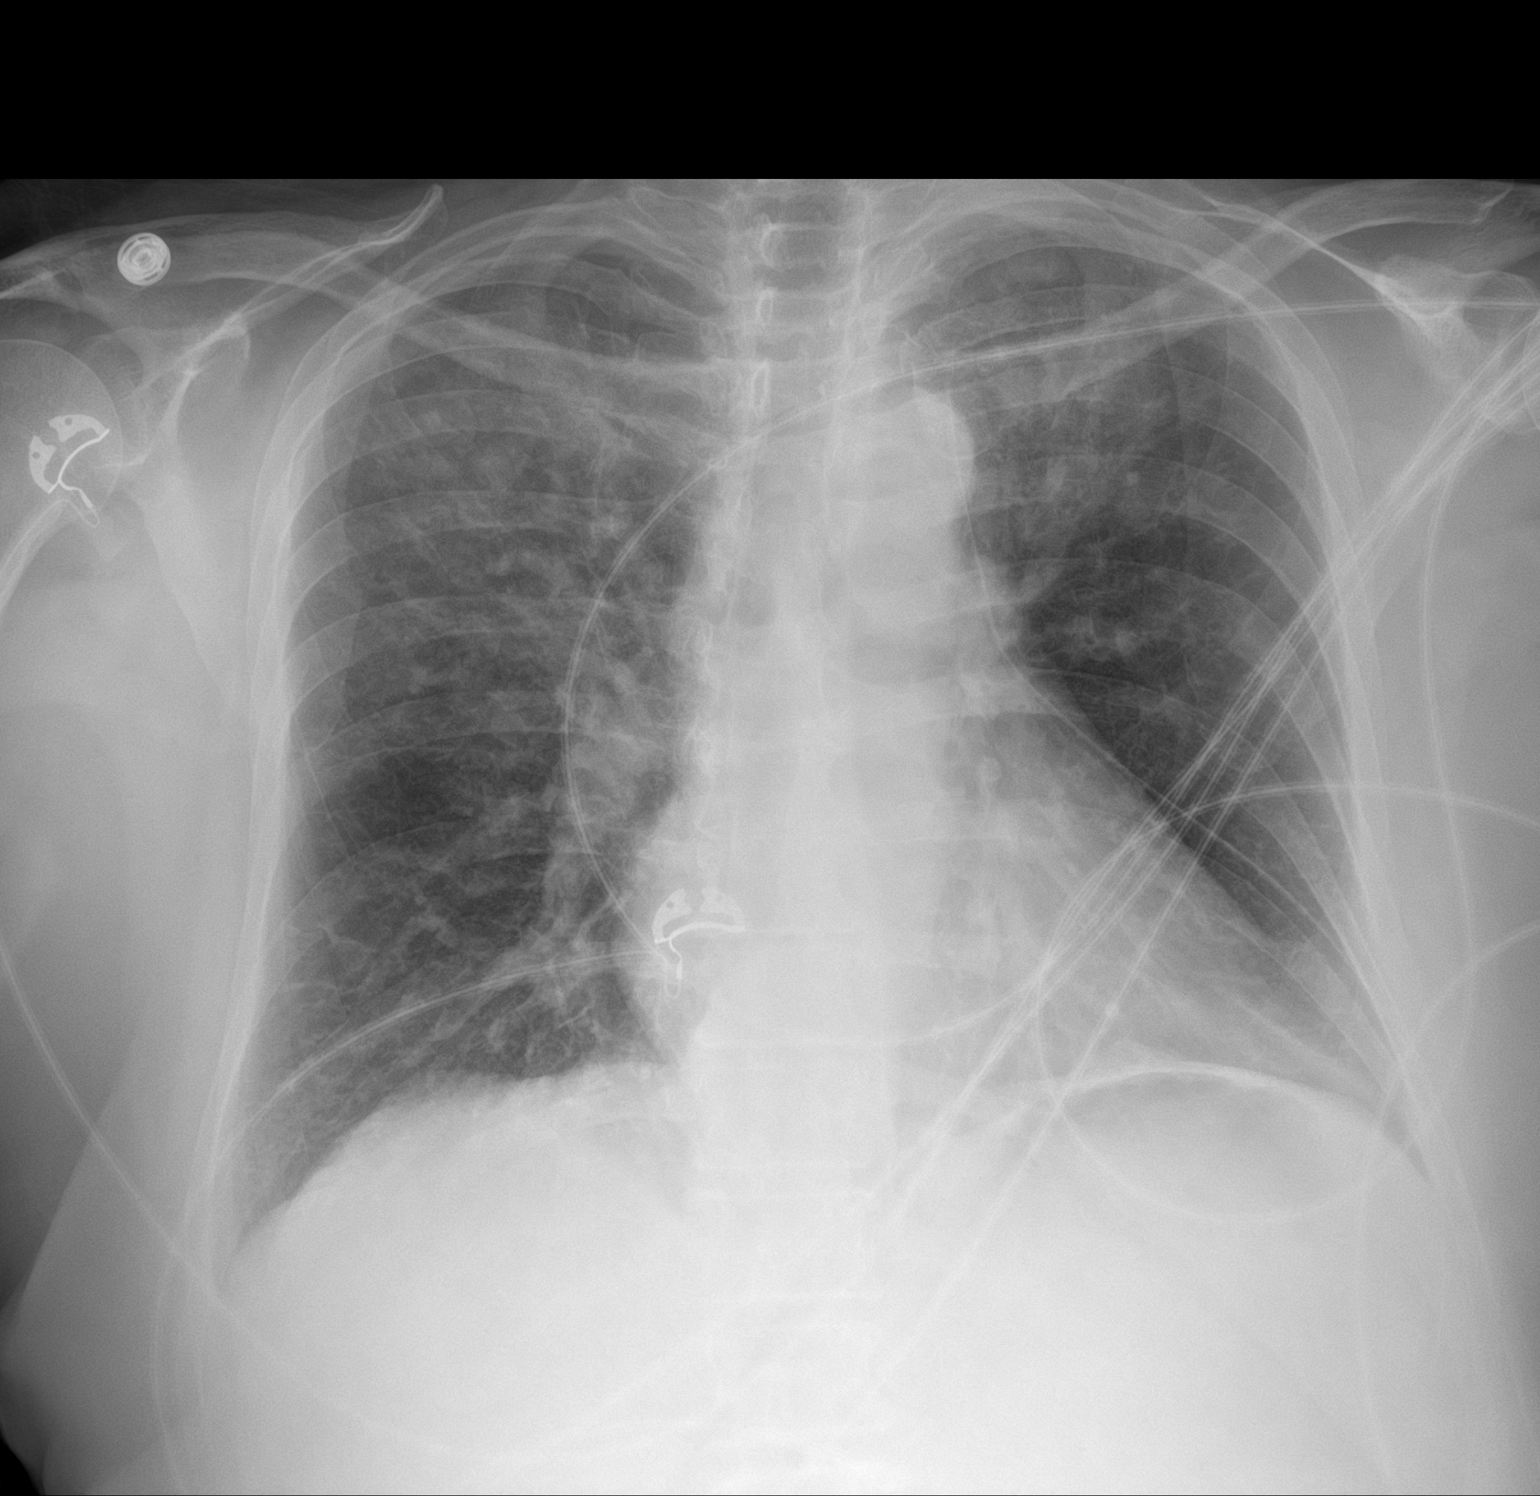

[1 of 1 positions shown; findings below may reference images not displayed]

FINDINGS: Heart is normal size. Patchy bilateral upper lobe airspace
opacities. No effusions. No acute bony abnormality.
IMPRESSION: Patchy bilateral upper lobe opacities, right greater than left.

## 2021-03-29 SURGERY — LEFT HEART CATH AND CORONARY ANGIOGRAPHY
Anesthesia: LOCAL

## 2021-03-29 MED ORDER — HYDRALAZINE HCL 20 MG/ML IJ SOLN: 10.0000 mg | INTRAMUSCULAR | Status: AC | PRN

## 2021-03-29 MED ORDER — BUTALBITAL-APAP-CAFFEINE 50-325-40 MG PO TABS
1.0000 | ORAL_TABLET | Freq: Four times a day (QID) | ORAL | Status: DC | PRN
  Administered 2021-04-05: 1 via ORAL
  Filled 2021-03-29: qty 1

## 2021-03-29 MED ORDER — SODIUM CHLORIDE 0.9% FLUSH: 3.0000 mL | INTRAVENOUS | Status: DC | PRN

## 2021-03-29 MED ORDER — DIPHENHYDRAMINE HCL 25 MG PO CAPS: 25.0000 mg | ORAL_CAPSULE | Freq: Four times a day (QID) | ORAL | Status: DC | PRN

## 2021-03-29 MED ORDER — HEPARIN (PORCINE) 25000 UT/250ML-% IV SOLN: 750.0000 [IU]/h | INTRAVENOUS | Status: DC

## 2021-03-29 MED ORDER — FENOFIBRATE 160 MG PO TABS
160.0000 mg | ORAL_TABLET | Freq: Every day | ORAL | Status: DC
  Administered 2021-03-30 – 2021-04-05 (×7): 160 mg via ORAL
  Filled 2021-03-29 (×7): qty 1

## 2021-03-29 MED ORDER — LIDOCAINE HCL (PF) 1 % IJ SOLN
INTRAMUSCULAR | Status: AC
  Filled 2021-03-29: qty 30

## 2021-03-29 MED ORDER — MONTELUKAST SODIUM 10 MG PO TABS
10.0000 mg | ORAL_TABLET | Freq: Every day | ORAL | Status: DC
  Administered 2021-03-29 – 2021-04-05 (×8): 10 mg via ORAL
  Filled 2021-03-29 (×8): qty 1

## 2021-03-29 MED ORDER — FUROSEMIDE 10 MG/ML IJ SOLN
40.0000 mg | Freq: Once | INTRAMUSCULAR | Status: AC
  Administered 2021-03-29: 40 mg via INTRAVENOUS
  Filled 2021-03-29: qty 4

## 2021-03-29 MED ORDER — MORPHINE SULFATE (PF) 4 MG/ML IV SOLN
4.0000 mg | Freq: Once | INTRAVENOUS | Status: AC
  Administered 2021-03-29: 4 mg via INTRAVENOUS
  Filled 2021-03-29: qty 1

## 2021-03-29 MED ORDER — LORATADINE 10 MG PO TABS
10.0000 mg | ORAL_TABLET | Freq: Every day | ORAL | Status: DC
  Administered 2021-03-30 – 2021-04-05 (×7): 10 mg via ORAL
  Filled 2021-03-29 (×7): qty 1

## 2021-03-29 MED ORDER — ACETAMINOPHEN 325 MG PO TABS
650.0000 mg | ORAL_TABLET | ORAL | Status: DC | PRN
  Administered 2021-03-30 – 2021-04-05 (×12): 650 mg via ORAL
  Filled 2021-03-29 (×12): qty 2

## 2021-03-29 MED ORDER — VERAPAMIL HCL 2.5 MG/ML IV SOLN
INTRAVENOUS | Status: DC | PRN
  Administered 2021-03-29: 10 mL via INTRA_ARTERIAL

## 2021-03-29 MED ORDER — TIZANIDINE HCL 4 MG PO TABS: 8.0000 mg | ORAL_TABLET | Freq: Every day | ORAL | Status: DC | PRN

## 2021-03-29 MED ORDER — SODIUM CHLORIDE 0.9 % IV SOLN
250.0000 mL | INTRAVENOUS | Status: DC | PRN
  Administered 2021-03-30 – 2021-03-31 (×2): 250 mL via INTRAVENOUS

## 2021-03-29 MED ORDER — DULOXETINE HCL 60 MG PO CPEP
60.0000 mg | ORAL_CAPSULE | Freq: Every day | ORAL | Status: DC
  Administered 2021-03-30 – 2021-04-05 (×7): 60 mg via ORAL
  Filled 2021-03-29 (×8): qty 1

## 2021-03-29 MED ORDER — NITROGLYCERIN 0.4 MG SL SUBL
0.4000 mg | SUBLINGUAL_TABLET | SUBLINGUAL | Status: DC | PRN
  Administered 2021-03-29 (×2): 0.4 mg via SUBLINGUAL
  Filled 2021-03-29 (×2): qty 1

## 2021-03-29 MED ORDER — ONDANSETRON HCL 4 MG PO TABS: 4.0000 mg | ORAL_TABLET | Freq: Three times a day (TID) | ORAL | Status: DC | PRN

## 2021-03-29 MED ORDER — HEPARIN SODIUM (PORCINE) 5000 UNIT/ML IJ SOLN
INTRAMUSCULAR | Status: AC
  Filled 2021-03-29: qty 1

## 2021-03-29 MED ORDER — ONDANSETRON HCL 4 MG/2ML IJ SOLN
4.0000 mg | Freq: Four times a day (QID) | INTRAMUSCULAR | Status: DC | PRN
  Administered 2021-03-29 – 2021-03-30 (×2): 4 mg via INTRAVENOUS
  Filled 2021-03-29 (×2): qty 2

## 2021-03-29 MED ORDER — HEPARIN (PORCINE) 25000 UT/250ML-% IV SOLN
750.0000 [IU]/h | INTRAVENOUS | Status: DC
  Filled 2021-03-29 (×2): qty 250

## 2021-03-29 MED ORDER — SODIUM CHLORIDE 0.9% FLUSH
3.0000 mL | Freq: Two times a day (BID) | INTRAVENOUS | Status: DC
  Administered 2021-03-30 – 2021-04-05 (×9): 3 mL via INTRAVENOUS

## 2021-03-29 MED ORDER — TIROFIBAN (AGGRASTAT) BOLUS VIA INFUSION
25.0000 ug/kg | Freq: Once | INTRAVENOUS | Status: AC
  Administered 2021-03-29: 1925 ug via INTRAVENOUS
  Filled 2021-03-29: qty 39

## 2021-03-29 MED ORDER — CLONAZEPAM 0.5 MG PO TABS
0.5000 mg | ORAL_TABLET | Freq: Every day | ORAL | Status: DC | PRN
  Administered 2021-03-30 – 2021-04-04 (×6): 0.5 mg via ORAL
  Filled 2021-03-29 (×6): qty 1

## 2021-03-29 MED ORDER — PANTOPRAZOLE SODIUM 40 MG PO TBEC
40.0000 mg | DELAYED_RELEASE_TABLET | Freq: Two times a day (BID) | ORAL | Status: DC
  Administered 2021-03-29 – 2021-04-05 (×15): 40 mg via ORAL
  Filled 2021-03-29 (×15): qty 1

## 2021-03-29 MED ORDER — IOHEXOL 350 MG/ML SOLN
INTRAVENOUS | Status: DC | PRN
  Administered 2021-03-29: 30 mL via INTRA_ARTERIAL

## 2021-03-29 MED ORDER — PROPRANOLOL HCL 80 MG PO TABS
80.0000 mg | ORAL_TABLET | Freq: Two times a day (BID) | ORAL | Status: DC
  Administered 2021-03-29 – 2021-03-30 (×2): 80 mg via ORAL
  Filled 2021-03-29 (×3): qty 1

## 2021-03-29 MED ORDER — VERAPAMIL HCL 2.5 MG/ML IV SOLN
INTRAVENOUS | Status: AC
  Filled 2021-03-29: qty 2

## 2021-03-29 MED ORDER — PANTOPRAZOLE SODIUM 20 MG PO TBEC: 20.0000 mg | DELAYED_RELEASE_TABLET | Freq: Every day | ORAL | Status: DC

## 2021-03-29 MED ORDER — NITROGLYCERIN IN D5W 200-5 MCG/ML-% IV SOLN
0.0000 ug/min | INTRAVENOUS | Status: DC
  Administered 2021-03-29: 5 ug/min via INTRAVENOUS
  Administered 2021-04-03: 18.333 ug/min via INTRAVENOUS
  Administered 2021-04-03: 23.333 ug/min via INTRAVENOUS
  Administered 2021-04-03: 21.667 ug/min via INTRAVENOUS
  Administered 2021-04-03: 20 ug/min via INTRAVENOUS
  Administered 2021-04-03: 25 ug/min via INTRAVENOUS
  Filled 2021-03-29 (×4): qty 250

## 2021-03-29 MED ORDER — LOSARTAN POTASSIUM 50 MG PO TABS
50.0000 mg | ORAL_TABLET | Freq: Every day | ORAL | Status: DC
  Administered 2021-03-29 – 2021-03-30 (×2): 50 mg via ORAL
  Filled 2021-03-29 (×2): qty 1

## 2021-03-29 MED ORDER — ORAL CARE MOUTH RINSE
15.0000 mL | Freq: Two times a day (BID) | OROMUCOSAL | Status: DC
  Administered 2021-03-29 – 2021-04-05 (×11): 15 mL via OROMUCOSAL

## 2021-03-29 MED ORDER — FLUTICASONE FUROATE-VILANTEROL 200-25 MCG/ACT IN AEPB
1.0000 | INHALATION_SPRAY | Freq: Every day | RESPIRATORY_TRACT | Status: DC
  Administered 2021-03-30 – 2021-04-05 (×4): 1 via RESPIRATORY_TRACT
  Filled 2021-03-29: qty 28

## 2021-03-29 MED ORDER — ONDANSETRON HCL 4 MG/2ML IJ SOLN
4.0000 mg | Freq: Once | INTRAMUSCULAR | Status: AC
  Administered 2021-03-29: 4 mg via INTRAVENOUS
  Filled 2021-03-29: qty 2

## 2021-03-29 MED ORDER — HEPARIN (PORCINE) 25000 UT/250ML-% IV SOLN
1100.0000 [IU]/h | INTRAVENOUS | Status: DC
  Administered 2021-03-30: 750 [IU]/h via INTRAVENOUS
  Administered 2021-03-31 – 2021-04-02 (×3): 950 [IU]/h via INTRAVENOUS
  Administered 2021-04-03: 1100 [IU]/h via INTRAVENOUS
  Filled 2021-03-29 (×4): qty 250

## 2021-03-29 MED ORDER — METOPROLOL TARTRATE 25 MG PO TABS
25.0000 mg | ORAL_TABLET | Freq: Once | ORAL | Status: AC
  Administered 2021-03-29: 25 mg via ORAL
  Filled 2021-03-29: qty 1

## 2021-03-29 MED ORDER — SODIUM CHLORIDE 0.9 % IV SOLN: INTRAVENOUS | Status: AC

## 2021-03-29 MED ORDER — ASPIRIN EC 81 MG PO TBEC
81.0000 mg | DELAYED_RELEASE_TABLET | Freq: Every day | ORAL | Status: DC
  Administered 2021-03-30 – 2021-04-05 (×7): 81 mg via ORAL
  Filled 2021-03-29 (×7): qty 1

## 2021-03-29 MED ORDER — HEPARIN SODIUM (PORCINE) 1000 UNIT/ML IJ SOLN
INTRAMUSCULAR | Status: AC
  Filled 2021-03-29: qty 10

## 2021-03-29 MED ORDER — HEPARIN BOLUS VIA INFUSION: 4000.0000 [IU] | Freq: Once | INTRAVENOUS | Status: DC

## 2021-03-29 MED ORDER — HEPARIN SODIUM (PORCINE) 5000 UNIT/ML IJ SOLN
4000.0000 [IU] | Freq: Once | INTRAMUSCULAR | Status: AC
  Administered 2021-03-29: 4000 [IU] via INTRAVENOUS

## 2021-03-29 MED ORDER — HEPARIN (PORCINE) IN NACL 1000-0.9 UT/500ML-% IV SOLN
INTRAVENOUS | Status: AC
  Filled 2021-03-29: qty 1000

## 2021-03-29 MED ORDER — ASPIRIN 81 MG PO CHEW
81.0000 mg | CHEWABLE_TABLET | Freq: Once | ORAL | Status: AC
  Administered 2021-03-29: 81 mg via ORAL
  Filled 2021-03-29: qty 1

## 2021-03-29 MED ORDER — LIDOCAINE HCL (PF) 1 % IJ SOLN
INTRAMUSCULAR | Status: DC | PRN
  Administered 2021-03-29: 2 mL via INTRADERMAL

## 2021-03-29 MED ORDER — HEPARIN SODIUM (PORCINE) 1000 UNIT/ML IJ SOLN
INTRAMUSCULAR | Status: DC | PRN
  Administered 2021-03-29: 4000 [IU] via INTRAVENOUS

## 2021-03-29 MED ORDER — CHLORHEXIDINE GLUCONATE CLOTH 2 % EX PADS
6.0000 | MEDICATED_PAD | Freq: Every day | CUTANEOUS | Status: DC
  Administered 2021-03-30 – 2021-03-31 (×3): 6 via TOPICAL

## 2021-03-29 MED ORDER — INFLUENZA VAC A&B SA ADJ QUAD 0.5 ML IM PRSY
0.5000 mL | PREFILLED_SYRINGE | INTRAMUSCULAR | Status: DC | PRN
  Filled 2021-03-29: qty 0.5

## 2021-03-29 MED ORDER — NITROGLYCERIN 2 % TD OINT
1.0000 [in_us] | TOPICAL_OINTMENT | Freq: Once | TRANSDERMAL | Status: AC
  Administered 2021-03-29: 1 [in_us] via TOPICAL
  Filled 2021-03-29: qty 1

## 2021-03-29 MED ORDER — DULOXETINE HCL 20 MG PO CPEP: 20.0000 mg | ORAL_CAPSULE | Freq: Every day | ORAL | Status: DC

## 2021-03-29 MED ORDER — DULOXETINE HCL 30 MG PO CPEP
30.0000 mg | ORAL_CAPSULE | Freq: Every day | ORAL | Status: DC
  Administered 2021-03-29 – 2021-04-05 (×8): 30 mg via ORAL
  Filled 2021-03-29 (×9): qty 1

## 2021-03-29 MED ORDER — LEVOTHYROXINE SODIUM 75 MCG PO TABS
37.5000 ug | ORAL_TABLET | Freq: Every day | ORAL | Status: DC
  Administered 2021-03-30 – 2021-04-05 (×7): 37.5 ug via ORAL
  Filled 2021-03-29 (×5): qty 1
  Filled 2021-03-29: qty 2
  Filled 2021-03-29: qty 1

## 2021-03-29 MED ORDER — HEPARIN (PORCINE) 25000 UT/250ML-% IV SOLN
750.0000 [IU]/h | INTRAVENOUS | Status: DC
  Filled 2021-03-29: qty 250

## 2021-03-29 MED ORDER — LABETALOL HCL 5 MG/ML IV SOLN: 10.0000 mg | INTRAVENOUS | Status: AC | PRN

## 2021-03-29 MED ORDER — MAGNESIUM SULFATE 4 GM/100ML IV SOLN
4.0000 g | Freq: Once | INTRAVENOUS | Status: AC
  Administered 2021-03-29: 4 g via INTRAVENOUS
  Filled 2021-03-29: qty 100

## 2021-03-29 MED ORDER — ALBUTEROL SULFATE HFA 108 (90 BASE) MCG/ACT IN AERS
2.0000 | INHALATION_SPRAY | RESPIRATORY_TRACT | Status: DC | PRN
  Administered 2021-03-29 – 2021-03-30 (×2): 2 via RESPIRATORY_TRACT
  Filled 2021-03-29: qty 6.7

## 2021-03-29 MED ORDER — TRAZODONE HCL 50 MG PO TABS
150.0000 mg | ORAL_TABLET | Freq: Every day | ORAL | Status: DC
  Administered 2021-03-29 – 2021-04-05 (×8): 150 mg via ORAL
  Filled 2021-03-29 (×8): qty 3

## 2021-03-29 MED ORDER — TIROFIBAN HCL IV 12.5 MG/250 ML
0.0750 ug/kg/min | INTRAVENOUS | Status: AC
  Administered 2021-03-29: 0.075 ug/kg/min via INTRAVENOUS
  Filled 2021-03-29: qty 250

## 2021-03-29 SURGICAL SUPPLY — 11 items
CATH OPTITORQUE TIG 4.0 5F (CATHETERS) ×1 IMPLANT
DEVICE RAD COMP TR BAND LRG (VASCULAR PRODUCTS) ×1 IMPLANT
ELECT DEFIB PAD ADLT CADENCE (PAD) ×1 IMPLANT
GLIDESHEATH SLEND A-KIT 6F 22G (SHEATH) ×1 IMPLANT
GUIDEWIRE INQWIRE 1.5J.035X260 (WIRE) IMPLANT
INQWIRE 1.5J .035X260CM (WIRE) ×2
KIT HEART LEFT (KITS) ×2 IMPLANT
PACK CARDIAC CATHETERIZATION (CUSTOM PROCEDURE TRAY) ×2 IMPLANT
SYR MEDRAD MARK 7 150ML (SYRINGE) ×2 IMPLANT
TRANSDUCER W/STOPCOCK (MISCELLANEOUS) ×2 IMPLANT
TUBING CIL FLEX 10 FLL-RA (TUBING) ×2 IMPLANT

## 2021-03-29 NOTE — H&P (Signed)
Meredith Guerra is an 71 y.o. female.   Chief Complaint: Chest pain HPI:   71 y.o. Caucasian female  with hypertension, asthma, recent diagnosis of COVID, on day 5 of Paxlovid therapy, now presented to [facility] with severe chest pain starting 5:30 PM.  EKG showed anterolateral ST elevation.  She was transferred emergently to [facility] for cardiac catheterization.  Past Medical History:  Diagnosis Date   Asthma    Colitis    Hypertension    Kidney stones    Migraines     Past Surgical History:  Procedure Laterality Date   ABDOMINAL HYSTERECTOMY     SHOULDER SURGERY     TONSILLECTOMY       Family History  Family history unknown: Yes    Social History:  reports that she has never smoked. She has never used smokeless tobacco. She reports that she does not currently use alcohol. She reports that she does not currently use drugs.  Allergies:  Allergies  Allergen Reactions   Compazine [Prochlorperazine] Other (See Comments)   Dilaudid [Hydromorphone] Other (See Comments)   Reglan [Metoclopramide] Other (See Comments)    Review of Systems  Constitutional: Negative for decreased appetite, malaise/fatigue, weight gain and weight loss.  HENT:  Negative for congestion.   Eyes:  Negative for visual disturbance.  Cardiovascular:  Positive for chest pain. Negative for dyspnea on exertion, leg swelling, palpitations and syncope.  Respiratory:  Negative for cough.   Endocrine: Negative for cold intolerance.  Hematologic/Lymphatic: Does not bruise/bleed easily.  Skin:  Negative for itching and rash.  Musculoskeletal:  Negative for myalgias.  Gastrointestinal:  Negative for abdominal pain, nausea and vomiting.  Genitourinary:  Negative for dysuria.  Neurological:  Negative for dizziness and weakness.  Psychiatric/Behavioral:  The patient is not nervous/anxious.   All other systems reviewed and are negative.   Blood pressure (!) 194/120, pulse 75, temperature 98.4  F (36.9 C), temperature source Oral, resp. rate 14, height 5\' 3"  (1.6 m), weight 77 kg, SpO2 98 %. Body mass index is 30.07 kg/m.  Physical Exam Vitals and nursing note reviewed.  Constitutional:      General: She is not in acute distress.    Appearance: She is well-developed.  HENT:     Head: Normocephalic and atraumatic.  Eyes:     Conjunctiva/sclera: Conjunctivae normal.     Pupils: Pupils are equal, round, and reactive to light.  Neck:     Vascular: No JVD.  Cardiovascular:     Rate and Rhythm: Normal rate and regular rhythm.     Pulses: Intact distal pulses.          Dorsalis pedis pulses are 0 on the right side and 0 on the left side.       Posterior tibial pulses are 1+ on the right side and 1+ on the left side.     Heart sounds: No murmur heard. Pulmonary:     Effort: Pulmonary effort is normal.     Breath sounds: Normal breath sounds. No wheezing or rales.  Abdominal:     General: Bowel sounds are normal.     Palpations: Abdomen is soft.     Tenderness: There is no rebound.  Musculoskeletal:        General: No tenderness. Normal range of motion.     Right lower leg: No edema.     Left lower leg: No edema.  Lymphadenopathy:     Cervical: No cervical adenopathy.  Skin:  General: Skin is warm and dry.  Neurological:     Mental Status: She is alert and oriented to person, place, and time.     Cranial Nerves: No cranial nerve deficit.    Results for orders placed or performed during the hospital encounter of 03/29/21 (from the past 48 hour(s))  CBC     Status: Abnormal   Collection Time: 03/29/21  7:38 PM  Result Value Ref Range   WBC 8.8 4.0 - 10.5 K/uL   RBC 5.28 (H) 3.87 - 5.11 MIL/uL   Hemoglobin 15.5 (H) 12.0 - 15.0 g/dL   HCT 45.4 36.0 - 46.0 %   MCV 86.0 80.0 - 100.0 fL   MCH 29.4 26.0 - 34.0 pg   MCHC 34.1 30.0 - 36.0 g/dL   RDW 14.1 11.5 - 15.5 %   Platelets 332 150 - 400 K/uL   nRBC 0.0 0.0 - 0.2 %    Comment: Performed at [facility],  [address], [place], [place] [address]    Labs:   Lab Results  Component Value Date   WBC 8.8 03/29/2021   HGB 15.5 (H) 03/29/2021   HCT 45.4 03/29/2021   MCV 86.0 03/29/2021   PLT 332 03/29/2021   Labs pending  TSH Recent Labs    01/28/21 1151  TSH 2.84     (Not in a hospital admission)     Current Facility-Administered Medications:    nitroGLYCERIN (NITROSTAT) SL tablet 0.4 mg, 0.4 mg, Sublingual, Q5 Min x 3 PRN, Godfrey Pick, MD, 0.4 mg at 03/29/21 1941  Current Outpatient Medications:    albuterol (VENTOLIN HFA) 108 (90 Base) MCG/ACT inhaler, Inhale 2 puffs into the lungs every 4 (four) hours as needed for wheezing or shortness of breath., Disp: , Rfl:    amphetamine-dextroamphetamine (ADDERALL) 20 MG tablet, Take 20 mg by mouth daily., Disp: , Rfl:    atorvastatin (LIPITOR) 10 MG tablet, Take 10 mg by mouth at bedtime., Disp: , Rfl:    budesonide-formoterol (SYMBICORT) 160-4.5 MCG/ACT inhaler, Inhale 2 puffs into the lungs 2 (two) times daily., Disp: , Rfl:    butalbital-acetaminophen-caffeine (FIORICET) 50-325-40 MG tablet, , Disp: , Rfl:    Cholecalciferol (VITAMIN D-3) 25 MCG (1000 UT) CAPS, Take 1 capsule by mouth daily., Disp: , Rfl:    clonazePAM (KLONOPIN) 0.5 MG tablet, Take 0.5 mg by mouth daily. As needed., Disp: , Rfl:    diphenhydrAMINE (BENADRYL) 25 mg capsule, Take 25 mg by mouth every 6 (six) hours as needed., Disp: , Rfl:    DULoxetine (CYMBALTA) 20 MG capsule, 20 mg. Take 60 mg every morning and 30 mg every evening, Disp: , Rfl:    fenofibrate (TRICOR) 145 MG tablet, Take 145 mg by mouth daily., Disp: , Rfl:    levothyroxine (SYNTHROID) 25 MCG tablet, Take 25 mcg by mouth See admin instructions. Take 1 and 1/2 every morning, Disp: , Rfl:    loratadine (CLARITIN) 10 MG tablet, Take 10 mg by mouth daily., Disp: , Rfl:    montelukast (SINGULAIR) 10 MG tablet, Take 10 mg by mouth at bedtime., Disp: , Rfl:    ondansetron (ZOFRAN) 4 MG tablet, Take 4 mg by  mouth every 8 (eight) hours as needed for nausea or vomiting., Disp: , Rfl:    pantoprazole (PROTONIX) 20 MG tablet, Take 1 tablet (20 mg total) by mouth daily. (Patient taking differently: Take 20 mg by mouth 2 (two) times daily.), Disp: 30 tablet, Rfl: 1   Probiotic Product (PROBIOTIC ADVANCED  PO), Take by mouth in the morning and at bedtime., Disp: , Rfl:    promethazine (PHENERGAN) 12.5 MG tablet, Take 12.5 mg by mouth every 4 (four) hours as needed., Disp: , Rfl:    propranolol (INDERAL) 80 MG tablet, Take 80 mg by mouth 2 (two) times daily., Disp: , Rfl:    Rimegepant Sulfate (NURTEC PO), Take by mouth as needed., Disp: , Rfl:    tiZANidine (ZANAFLEX) 4 MG tablet, Take 4 mg by mouth See admin instructions. Take 2 tablets every evening if needed take 1/2 to a whole if headache is bad, Disp: , Rfl:    traZODone (DESYREL) 150 MG tablet, Take 150 mg by mouth at bedtime., Disp: , Rfl:    Today's Vitals   03/29/21 1933 03/29/21 1937 03/29/21 1951 03/29/21 1954  BP: (!) 194/120     Pulse:   75   Resp: (!) 22  14   Temp:      TempSrc:      SpO2:   98%   Weight:    77 kg  Height:    5\' 3"  (1.6 m)  PainSc:  10-Worst pain ever     Body mass index is 30.07 kg/m.   CARDIAC STUDIES:  EKG 03/29/2021: Sinus rhythm Acute anterolateral infarct  Echocardiogram: Pending    Assessment/Plan  71 y.o. Caucasian female  with hypertension, asthma, recent diagnosis of COVID, on day 5 of Paxil with therapy, now presented to [facility] with severe chest pain.  EKG showed anterolateral ST elevation.  She was transferred emergently to [facility] for cardiac catheterization.  Acute anterolateral STEMI Emergent cardiac catheterization under COVID precautions In addition to aspirin and heparin, recommended p.o. metoprolol 25 mg at [facility], ER, and 2.5 mg IV metoprolol as needed x2 for systolic blood pressure [id number] mm   Nigel Mormon, MD Pager: [phone] Office:  [phone]

## 2021-03-29 NOTE — Progress Notes (Signed)
[facility] for heparin gtt  Indication: chest pain/ACS  Allergies  Allergen Reactions   Compazine [Prochlorperazine] Other (See Comments)   Dilaudid [Hydromorphone] Other (See Comments)   Reglan [Metoclopramide] Other (See Comments)    Patient Measurements: Height: 5\' 3"  (160 cm) Weight: 77 kg (169 lb 12.1 oz) IBW/kg (Calculated) : 52.4 Heparin Dosing Weight: HEPARIN DW (KG): 69   Vital Signs: Temp: 98.4 F (36.9 C) (01/09 1921) Temp Source: Oral (01/09 1921) BP: 172/108 (01/09 2125) Pulse Rate: 0 (01/09 2130)  Labs: Recent Labs    03/29/21 1938  HGB 15.5*  HCT 45.4  PLT 332  CREATININE 1.08*     Estimated Creatinine Clearance: 48.3 mL/min (A) (by C-G formula based on SCr of 1.08 mg/dL (H)).   Medical History: Past Medical History:  Diagnosis Date   Asthma    Colitis    Hypertension    Kidney stones    Migraines     Medications:  Medications Prior to Admission  Medication Sig Dispense Refill Last Dose   albuterol (VENTOLIN HFA) 108 (90 Base) MCG/ACT inhaler Inhale 2 puffs into the lungs every 4 (four) hours as needed for wheezing or shortness of breath.      amphetamine-dextroamphetamine (ADDERALL) 20 MG tablet Take 20 mg by mouth daily.      atorvastatin (LIPITOR) 10 MG tablet Take 10 mg by mouth at bedtime.      budesonide-formoterol (SYMBICORT) 160-4.5 MCG/ACT inhaler Inhale 2 puffs into the lungs 2 (two) times daily.      butalbital-acetaminophen-caffeine (FIORICET) 50-325-40 MG tablet       Cholecalciferol (VITAMIN D-3) 25 MCG (1000 UT) CAPS Take 1 capsule by mouth daily.      clonazePAM (KLONOPIN) 0.5 MG tablet Take 0.5 mg by mouth daily. As needed.      diphenhydrAMINE (BENADRYL) 25 mg capsule Take 25 mg by mouth every 6 (six) hours as needed.      DULoxetine (CYMBALTA) 20 MG capsule 20 mg. Take 60 mg every morning and 30 mg every evening      fenofibrate (TRICOR) 145 MG tablet Take 145 mg by mouth daily.       levothyroxine (SYNTHROID) 25 MCG tablet Take 25 mcg by mouth See admin instructions. Take 1 and 1/2 every morning      loratadine (CLARITIN) 10 MG tablet Take 10 mg by mouth daily.      montelukast (SINGULAIR) 10 MG tablet Take 10 mg by mouth at bedtime.      ondansetron (ZOFRAN) 4 MG tablet Take 4 mg by mouth every 8 (eight) hours as needed for nausea or vomiting.      pantoprazole (PROTONIX) 20 MG tablet Take 1 tablet (20 mg total) by mouth daily. (Patient taking differently: Take 20 mg by mouth 2 (two) times daily.) 30 tablet 1    Probiotic Product (PROBIOTIC ADVANCED PO) Take by mouth in the morning and at bedtime.      promethazine (PHENERGAN) 12.5 MG tablet Take 12.5 mg by mouth every 4 (four) hours as needed.      propranolol (INDERAL) 80 MG tablet Take 80 mg by mouth 2 (two) times daily.      Rimegepant Sulfate (NURTEC PO) Take by mouth as needed.      tiZANidine (ZANAFLEX) 4 MG tablet Take 4 mg by mouth See admin instructions. Take 2 tablets every evening if needed take 1/2 to a whole if headache is bad      traZODone (DESYREL) 150 MG tablet  Take 150 mg by mouth at bedtime.      Scheduled:   [START ON 03/30/2021] aspirin EC  81 mg Oral Daily   tirofiban  25 mcg/kg Intravenous Once   Infusions:   [START ON 03/30/2021] heparin     nitroGLYCERIN     tirofiban     PRN: iohexol, lidocaine (PF), [MAR Hold] nitroGLYCERIN, Radial Cocktail/Verapamil only Anti-infectives (From admission, onward)    None       Assessment: Meredith Guerra a 71 y.o. female requires anticoagulation with a heparin iv infusion for the indication of  chest pain/ACS. Heparin gtt will be started following pharmacy protocol per pharmacy consult.   Patient transferred to [facility] for cath as a code stemi and found to have multivessel disease and will have surgical consult. Discussed plan with cardiology will start and continue tirofiban x6 hours post cath d/t high clotting risk and restart heparin 6h after sheath pull.    Goal of Therapy:  Heparin level 0.3-0.7 units/ml Monitor platelets by anticoagulation protocol: Yes   Plan:  Tirofiban x 6h post cath - will reduce dose given renal dysfunction Restart heparin infusion at 750 units/hr Check anti-Xa level in 6 hours and daily while on heparin Continue to monitor H&H and platelets F/up surgical plan   Erin Hearing PharmD., BCPS Clinical Pharmacist 03/29/2021 9:33 PM

## 2021-03-29 NOTE — Progress Notes (Signed)
ANTICOAGULATION CONSULT NOTE - Initial Consult  Pharmacy Consult for heparin gtt  Indication: chest pain/ACS  Allergies  Allergen Reactions   Compazine [Prochlorperazine] Other (See Comments)   Dilaudid [Hydromorphone] Other (See Comments)   Reglan [Metoclopramide] Other (See Comments)    Patient Measurements: Height: 5\' 3"  (160 cm) Weight: 77 kg (169 lb 12.1 oz) IBW/kg (Calculated) : 52.4 Heparin Dosing Weight: HEPARIN DW (KG): 69   Vital Signs: Temp: 98.4 F (36.9 C) (01/09 1921) Temp Source: Oral (01/09 1921) BP: 172/115 (01/09 1921) Pulse Rate: 74 (01/09 1921)  Labs: Recent Labs    03/29/21 1938  HGB 15.5*  HCT 45.4  PLT 332    CrCl cannot be calculated (Patient's most recent lab result is older than the maximum 21 days allowed.).   Medical History: Past Medical History:  Diagnosis Date   Asthma    Colitis    Hypertension    Kidney stones    Migraines     Medications:  (Not in a hospital admission)  Scheduled:   heparin  4,000 Units Intravenous Once   metoprolol tartrate  25 mg Oral Once   ondansetron  4 mg Intravenous Once   Infusions:   heparin     PRN: nitroGLYCERIN Anti-infectives (From admission, onward)    None       Assessment: Meredith Guerra a 71 y.o. female requires anticoagulation with a heparin iv infusion for the indication of  chest pain/ACS. Heparin gtt will be started following pharmacy protocol per pharmacy consult. Patient is not on previous oral anticoagulant that will require aPTT/HL correlation before transitioning to only HL monitoring.   Goal of Therapy:  Heparin level 0.3-0.7 units/ml Monitor platelets by anticoagulation protocol: Yes   Plan:  Give 4000 units bolus x 1 Start heparin infusion at 750 units/hr Check anti-Xa level in 6 hours and daily while on heparin Continue to monitor H&H and platelets   Donna Christen Ahijah Devery 03/29/2021,7:56 PM

## 2021-03-29 NOTE — ED Triage Notes (Signed)
Pt comes POV, c/o CP that started suddenly at 530pm with nausea, SOB, radation to L arm and L jaw, pt is pale, cool and diaphoretic. Pt is covid+ since Thursday and on paxlovid, hx of [place]ubo

## 2021-03-29 NOTE — ED Provider Notes (Addendum)
[facility] EMERGENCY DEPARTMENT Provider Note   CSN: [id number] Arrival date & time: 03/29/21  1914     History  Chief Complaint  Patient presents with   Chest Pain    Meredith Guerra is a 71 y.o. female.   Chest Pain Associated symptoms: diaphoresis and nausea   Associated symptoms: no abdominal pain, no back pain, no cough, no dizziness, no fever, no numbness, no palpitations, no shortness of breath, no vomiting and no weakness   Patient is a 71 year old female who presents for sudden onset of severe chest pain.  Chest pain is left-sided.  Onset was 5:30 PM.  Patient experienced radiation to her left shoulder.  She had diaphoresis and nausea.  She took some Tums without relief.  She subsequently took 3 baby aspirin's.  Pain was persistent and this prompted the patient to come to the ED.  Patient has no history of MI.  She has a remote history of Takotsubo cardiomyopathy.  This occurred in [place].  She no longer follows with cardiology.  Other medical history is notable for migraine headaches and HTN.  She had a recent diagnosis of COVID-19 and is currently on Paxlovid.  Currently, patient endorses 9/10 severity pain as well as nausea.    Home Medications Prior to Admission medications   Medication Sig Start Date End Date Taking? Authorizing Provider  albuterol (VENTOLIN HFA) 108 (90 Base) MCG/ACT inhaler Inhale 2 puffs into the lungs every 4 (four) hours as needed for wheezing or shortness of breath.    [provider]  amphetamine-dextroamphetamine (ADDERALL) 20 MG tablet Take 20 mg by mouth daily.    [provider]  atorvastatin (LIPITOR) 10 MG tablet Take 10 mg by mouth at bedtime.    [provider]  budesonide-formoterol (SYMBICORT) 160-4.5 MCG/ACT inhaler Inhale 2 puffs into the lungs 2 (two) times daily.    [provider]  butalbital-acetaminophen-caffeine (FIORICET) [phone] MG tablet  03/07/19   [provider]   Cholecalciferol (VITAMIN D-3) 25 MCG (1000 UT) CAPS Take 1 capsule by mouth daily.    [provider]  clonazePAM (KLONOPIN) 0.5 MG tablet Take 0.5 mg by mouth daily. As needed.    [provider]  diphenhydrAMINE (BENADRYL) 25 mg capsule Take 25 mg by mouth every 6 (six) hours as needed.    [provider]  DULoxetine (CYMBALTA) 20 MG capsule 20 mg. Take 60 mg every morning and 30 mg every evening 05/20/19   [provider]  fenofibrate (TRICOR) 145 MG tablet Take 145 mg by mouth daily.    [provider]  levothyroxine (SYNTHROID) 25 MCG tablet Take 25 mcg by mouth See admin instructions. Take 1 and 1/2 every morning    [provider]  loratadine (CLARITIN) 10 MG tablet Take 10 mg by mouth daily.    [provider]  montelukast (SINGULAIR) 10 MG tablet Take 10 mg by mouth at bedtime.    [provider]  ondansetron (ZOFRAN) 4 MG tablet Take 4 mg by mouth every 8 (eight) hours as needed for nausea or vomiting.    [provider]  pantoprazole (PROTONIX) 20 MG tablet Take 1 tablet (20 mg total) by mouth daily. Patient taking differently: Take 20 mg by mouth 2 (two) times daily. 06/20/20   Carlisle Cater, PA-C  Probiotic Product (PROBIOTIC ADVANCED PO) Take by mouth in the morning and at bedtime.    [provider]  promethazine (PHENERGAN) 12.5 MG tablet Take 12.5 mg  by mouth every 4 (four) hours as needed. 05/20/19   [provider]  propranolol (INDERAL) 80 MG tablet Take 80 mg by mouth 2 (two) times daily. 05/06/19   [provider]  Rimegepant Sulfate (NURTEC PO) Take by mouth as needed.    [provider]  tiZANidine (ZANAFLEX) 4 MG tablet Take 4 mg by mouth See admin instructions. Take 2 tablets every evening if needed take 1/2 to a whole if headache is bad 05/20/19   [provider]  traZODone (DESYREL) 150 MG tablet Take 150 mg by mouth at bedtime.    [provider]      Allergies    Compazine [prochlorperazine], Dilaudid [hydromorphone], and Reglan [metoclopramide]    Review of Systems   Review of Systems  Constitutional:  Positive for diaphoresis. Negative for chills and fever.  HENT:  Negative for ear pain and sore throat.   Eyes:  Negative for pain and visual disturbance.  Respiratory:  Negative for cough and shortness of breath.   Cardiovascular:  Positive for chest pain. Negative for palpitations and leg swelling.  Gastrointestinal:  Positive for nausea. Negative for abdominal distention, abdominal pain and vomiting.  Genitourinary:  Negative for dysuria, flank pain and hematuria.  Musculoskeletal:  Negative for back pain, myalgias and neck pain.  Skin:  Negative for color change and rash.  Neurological:  Negative for dizziness, seizures, syncope, weakness, light-headedness and numbness.  Hematological:  Does not bruise/bleed easily.  Psychiatric/Behavioral:  Negative for confusion and decreased concentration.   All other systems reviewed and are negative.  Physical Exam Updated Vital Signs BP 111/70    Pulse 65    Temp 98.4 F (36.9 C) (Oral)    Resp 20    Ht 5\' 3"  (1.6 m)    Wt 76.8 kg    SpO2 97%    BMI 29.99 kg/m  Physical Exam Vitals and nursing note reviewed.  Constitutional:      General: She is not in acute distress.    Appearance: She is well-developed and normal weight. She is ill-appearing. She is not toxic-appearing.  HENT:     Head: Normocephalic and atraumatic.  Eyes:     Extraocular Movements: Extraocular movements intact.     Conjunctiva/sclera: Conjunctivae normal.  Cardiovascular:     Rate and Rhythm: Normal rate and regular rhythm.     Heart sounds: No murmur heard. Pulmonary:     Effort: Pulmonary effort is normal. No tachypnea or respiratory distress.     Breath sounds: Normal breath sounds. No decreased breath sounds or wheezing.  Chest:     Chest wall: No tenderness.  Abdominal:     Palpations:  Abdomen is soft.     Tenderness: There is no abdominal tenderness.  Musculoskeletal:        General: No swelling. Normal range of motion.     Cervical back: Normal range of motion and neck supple.     Right lower leg: No edema.     Left lower leg: No edema.  Skin:    Capillary Refill: Capillary refill takes less than 2 seconds.     Coloration: Skin is pale. Skin is not cyanotic.  Neurological:     General: No focal deficit present.     Mental Status: She is alert and oriented to person, place, and time.     Cranial Nerves: No cranial nerve deficit.     Motor: No weakness.  Psychiatric:        Mood  and Affect: Mood is anxious.        Behavior: Behavior normal. Behavior is not agitated.    ED Results / Procedures / Treatments   Labs (all labs ordered are listed, but only abnormal results are displayed) Labs Reviewed  BASIC METABOLIC PANEL - Abnormal; Notable for the following components:      Result Value   Glucose, Bld 150 (*)    Creatinine, Ser 1.08 (*)    GFR, Estimated 56 (*)    All other components within normal limits  CBC - Abnormal; Notable for the following components:   RBC 5.28 (*)    Hemoglobin 15.5 (*)    All other components within normal limits  MAGNESIUM - Abnormal; Notable for the following components:   Magnesium 1.4 (*)    All other components within normal limits  LIPID PANEL - Abnormal; Notable for the following components:   Cholesterol 327 (*)    Triglycerides 221 (*)    VLDL 44 (*)    LDL Cholesterol 232 (*)    All other components within normal limits  HEPARIN LEVEL (UNFRACTIONATED) - Abnormal; Notable for the following components:   Heparin Unfractionated 0.19 (*)    All other components within normal limits  COMPREHENSIVE METABOLIC PANEL - Abnormal; Notable for the following components:   Glucose, Bld 190 (*)    Anion gap 19 (*)    All other components within normal limits  HEPARIN LEVEL (UNFRACTIONATED) - Abnormal; Notable for the following  components:   Heparin Unfractionated <0.10 (*)    All other components within normal limits  CBC - Abnormal; Notable for the following components:   WBC 12.0 (*)    RBC 5.63 (*)    Hemoglobin 16.1 (*)    HCT 47.9 (*)    All other components within normal limits  TROPONIN I (HIGH SENSITIVITY) - Abnormal; Notable for the following components:   Troponin I (High Sensitivity) 392 (*)    All other components within normal limits  TROPONIN I (HIGH SENSITIVITY) - Abnormal; Notable for the following components:   Troponin I (High Sensitivity) 1,583 (*)    All other components within normal limits  MRSA NEXT GEN BY PCR, NASAL  RESPIRATORY PANEL BY PCR  HEMOGLOBIN A1C  HEPARIN LEVEL (UNFRACTIONATED)    EKG EKG Interpretation  Date/Time:  Monday March 29 2021 19:37:10 EST Ventricular Rate:  78 PR Interval:  174 QRS Duration: 96 QT Interval:  389 QTC Calculation: 444 R Axis:   11 Text Interpretation: Lateral ST segment elevation Inferior reciprocal change  STEMI  Sinus rhythm Left ventricular hypertrophy Confirmed by Godfrey Pick [phone]) on 03/29/2021 8:28:58 PM  Radiology CARDIAC CATHETERIZATION  Result Date: 03/29/2021 Images from the original result were not included. LM: 90% distal stenosis at the trifurcation LAD: Proximal focal 80% stenosis at bifurcation with large septal perforator branch, focal mid 60% stenosis Ramus intermedius: No significant disease Left circumflex: Mid 30% stenosis RCA: Moderate diffuse disease in small caliber RPDA Aspirin Aggrastat for next 4 hours, then IV heparin IV nitroglycerin BP control CVTS consult for CABG Nigel Mormon, MD Pager: [phone] Office: [phone]  DG Chest Portable 1 View  Result Date: 03/29/2021 CLINICAL DATA:  Chest pain EXAM: PORTABLE CHEST 1 VIEW COMPARISON:  10/10/2020 FINDINGS: Heart is normal size. Patchy bilateral upper lobe airspace opacities. No effusions. No acute bony abnormality. IMPRESSION: Patchy bilateral upper  lobe opacities, right greater than left. Electronically Signed   By: Rolm Baptise M.D.   On: 03/29/2021  22:07    Procedures Procedures    Medications Ordered in ED Medications  nitroGLYCERIN (NITROSTAT) SL tablet 0.4 mg (0.4 mg Sublingual Given 03/29/21 2340)  labetalol (NORMODYNE) injection 10 mg (has no administration in time range)  hydrALAZINE (APRESOLINE) injection 10 mg (has no administration in time range)  acetaminophen (TYLENOL) tablet 650 mg (650 mg Oral Given 03/30/21 0611)  ondansetron (ZOFRAN) injection 4 mg (4 mg Intravenous Given 03/30/21 0332)  0.9 %  sodium chloride infusion ( Intravenous Infusion Verify 03/30/21 0600)  sodium chloride flush (NS) 0.9 % injection 3 mL (3 mLs Intravenous Not Given 03/30/21 0925)  sodium chloride flush (NS) 0.9 % injection 3 mL (has no administration in time range)  0.9 %  sodium chloride infusion ( Intravenous Infusion Verify 03/30/21 1100)  aspirin EC tablet 81 mg (81 mg Oral Given 03/30/21 0914)  nitroGLYCERIN 50 mg in dextrose 5 % 250 mL (0.2 mg/mL) infusion (20 mcg/min Intravenous Infusion Verify 03/30/21 1100)  tirofiban (AGGRASTAT) infusion 50 mcg/mL 250 mL (0 mcg/kg/min  77 kg Intravenous Stopped 03/30/21 0318)  albuterol (VENTOLIN HFA) 108 (90 Base) MCG/ACT inhaler 2 puff (2 puffs Inhalation Given 03/29/21 2242)  fluticasone furoate-vilanterol (BREO ELLIPTA) 200-25 MCG/ACT 1 puff (1 puff Inhalation Given 03/30/21 0809)  butalbital-acetaminophen-caffeine (FIORICET) 50-325-40 MG per tablet 1 tablet (has no administration in time range)  clonazePAM (KLONOPIN) tablet 0.5 mg (has no administration in time range)  diphenhydrAMINE (BENADRYL) capsule 25 mg (has no administration in time range)  fenofibrate tablet 160 mg (160 mg Oral Given 03/30/21 0914)  levothyroxine (SYNTHROID) tablet 37.5 mcg (37.5 mcg Oral Given 03/30/21 0610)  loratadine (CLARITIN) tablet 10 mg (10 mg Oral Given 03/30/21 0914)  montelukast (SINGULAIR) tablet 10 mg (10 mg Oral Given  03/29/21 2248)  ondansetron (ZOFRAN) tablet 4 mg (has no administration in time range)  propranolol (INDERAL) tablet 80 mg (80 mg Oral Given 03/30/21 0914)  tiZANidine (ZANAFLEX) tablet 8 mg (has no administration in time range)  traZODone (DESYREL) tablet 150 mg (150 mg Oral Given 03/29/21 2248)  heparin sodium (porcine) injection (4,000 Units Intravenous Given 03/29/21 2115)  pantoprazole (PROTONIX) EC tablet 40 mg (40 mg Oral Given 03/30/21 0914)  DULoxetine (CYMBALTA) DR capsule 60 mg (60 mg Oral Given 03/30/21 0914)  DULoxetine (CYMBALTA) DR capsule 30 mg (30 mg Oral Given 03/29/21 2251)  losartan (COZAAR) tablet 50 mg (50 mg Oral Given 03/30/21 0914)  Chlorhexidine Gluconate Cloth 2 % PADS 6 each (6 each Topical Given 03/30/21 1200)  MEDLINE mouth rinse (15 mLs Mouth Rinse Given 03/30/21 0924)  heparin ADULT infusion 100 units/mL (25000 units/270mL) (950 Units/hr Intravenous Rate/Dose Change 03/30/21 1245)  influenza vaccine adjuvanted (FLUAD) injection 0.5 mL (has no administration in time range)  perflutren lipid microspheres (DEFINITY) IV suspension (2 mLs Intravenous Given 03/30/21 0919)  atorvastatin (LIPITOR) tablet 80 mg (80 mg Oral Given 03/30/21 1149)  aspirin chewable tablet 81 mg (81 mg Oral Given 03/29/21 1941)  morphine 4 MG/ML injection 4 mg (4 mg Intravenous Given 03/29/21 1949)  nitroGLYCERIN (NITROGLYN) 2 % ointment 1 inch (1 inch Topical Given 03/29/21 1952)  ondansetron (ZOFRAN) injection 4 mg (4 mg Intravenous Given 03/29/21 1955)  metoprolol tartrate (LOPRESSOR) tablet 25 mg (25 mg Oral Given 03/29/21 2002)  heparin injection 4,000 Units ( Intravenous Not Given 03/29/21 2253)  tirofiban (AGGRASTAT) bolus via infusion 1,925 mcg (1,925 mcg Intravenous Bolus from Bag 03/29/21 2306)  magnesium sulfate IVPB 4 g 100 mL ( Intravenous Infusion Verify 03/30/21 0100)  furosemide (LASIX)  injection 40 mg (40 mg Intravenous Given 03/29/21 2251)    ED Course/ Medical Decision Making/ A&P                            Medical Decision Making  This patient presents to the ED for concern of chest pain, this involves an extensive number of treatment options, and is a complaint that carries with it a high risk of complications and morbidity.  The differential diagnosis includes STEMI, aortic dissection, GERD   Co morbidities that complicate the patient evaluation  HTN, GEJ stricture   Additional history obtained:  External records from outside source obtained and reviewed including EMR:   Lab Tests:  I Ordered, and personally interpreted labs.  The pertinent results include: No previous cardiology notes are present in EMR   Imaging Studies ordered:  I ordered imaging studies including chest x-ray I independently visualized and interpreted imaging which showed N/A.  Code STEMI was activated and patient was transported to [facility] for treatment prior to chest x-ray being obtained. I agree with the radiologist interpretation   Cardiac Monitoring:  The patient was maintained on a cardiac monitor.  I personally viewed and interpreted the cardiac monitored which showed an underlying rhythm of: Sinus rhythm   Medicines ordered and prescription drug management:  I ordered medication including 81 mg ASA (patient had taken 3 baby aspirin prior to arrival), morphine, nitroglycerin, heparin, and metoprolol for STEMI Reevaluation of the patient after these medicines showed that the patient improved.  Patient subsequently reported 1/10 severity pain following treatments. I have reviewed the patients home medicines and have made adjustments as needed  Critical Interventions:  Code STEMI was activated immediately upon arrival.  Patient was treated with ASA, morphine, NTG, heparin, and metoprolol.  She was emergently transferred to [facility] for further management.   Consultations Obtained:  I requested consultation with the cardiology,  and discussed lab and imaging findings as well as  pertinent plan - they recommend: Immediate, emergent transfer to [facility].  They agree with initial medical management.   Problem List / ED Course:  71 year old female with no history of MI, presenting for acute atypical chest pain.  EKG obtained in triage upon arrival concerning for STEMI with lateral lead injury and inferior lead reciprocal change.  Code STEMI was called.  Patient initially endorsing 9/10 severity chest pain.  Prior to arrival, she took 81 mg ASA x3.  Fourth baby aspirin was given in the ED.  Additionally, nitroglycerin and morphine were given with subsequent improvement to 1/10 severity chest pain.  Patient was started on heparin bolus and gtt.  Per cardiology recommendation, 25 mg p.o. metoprolol was given.  Patient was emergently transferred to [facility] for further management.   Reevaluation:  After the interventions noted above, I reevaluated the patient and found that they have :improved   Social Determinants of Health:  Patient appears to have good family support and access to outpatient care.   Dispostion:  After consideration of the diagnostic results and the patients response to treatment, I feel that the patent would benefit from emergent transfer to [facility] for cardiology management of STEMI.   CRITICAL CARE Performed by: Godfrey Pick   Total critical care time: 35 minutes  Critical care time was exclusive of separately billable procedures and treating other patients.  Critical care was necessary to treat or prevent imminent or life-threatening deterioration.  Critical  care was time spent personally by me on the following activities: development of treatment plan with patient and/or surrogate as well as nursing, discussions with consultants, evaluation of patient's response to treatment, examination of patient, obtaining history from patient or surrogate, ordering and performing treatments and interventions, ordering and  review of laboratory studies, ordering and review of radiographic studies, pulse oximetry and re-evaluation of patient's condition.          Final Clinical Impression(s) / ED Diagnoses Final diagnoses:  ST elevation myocardial infarction (STEMI), unspecified artery [facility])    Rx / DC Orders ED Discharge Orders     None         Godfrey Pick, MD 03/30/21 1312    Godfrey Pick, MD 03/30/21 1313

## 2021-03-29 NOTE — CV Procedure (Signed)
90% stenosis at distal LM trifurcation. No vessel occlusion. Admit to ICU with IV heparin, nitroglycerin. CVTS consult for CABG   Nigel Mormon, MD Pager: [phone] Office: [phone]

## 2021-03-30 ENCOUNTER — Inpatient Hospital Stay (HOSPITAL_COMMUNITY): Payer: Medicare Other

## 2021-03-30 ENCOUNTER — Other Ambulatory Visit: Payer: Self-pay

## 2021-03-30 ENCOUNTER — Other Ambulatory Visit (HOSPITAL_COMMUNITY): Payer: Self-pay

## 2021-03-30 ENCOUNTER — Encounter (HOSPITAL_COMMUNITY): Payer: Self-pay | Admitting: Cardiology

## 2021-03-30 DIAGNOSIS — U071 COVID-19: Secondary | ICD-10-CM

## 2021-03-30 DIAGNOSIS — I213 ST elevation (STEMI) myocardial infarction of unspecified site: Secondary | ICD-10-CM

## 2021-03-30 DIAGNOSIS — I25110 Atherosclerotic heart disease of native coronary artery with unstable angina pectoris: Secondary | ICD-10-CM

## 2021-03-30 LAB — HEMOGLOBIN A1C
Hgb A1c MFr Bld: 6.2 % — ABNORMAL HIGH (ref 4.8–5.6)
Mean Plasma Glucose: 131 mg/dL

## 2021-03-30 LAB — CBC
HCT: 47.9 % — ABNORMAL HIGH (ref 36.0–46.0)
Hemoglobin: 16.1 g/dL — ABNORMAL HIGH (ref 12.0–15.0)
MCH: 28.6 pg (ref 26.0–34.0)
MCHC: 33.6 g/dL (ref 30.0–36.0)
MCV: 85.1 fL (ref 80.0–100.0)
Platelets: 343 10*3/uL (ref 150–400)
RBC: 5.63 MIL/uL — ABNORMAL HIGH (ref 3.87–5.11)
RDW: 14 % (ref 11.5–15.5)
WBC: 12 10*3/uL — ABNORMAL HIGH (ref 4.0–10.5)
nRBC: 0 % (ref 0.0–0.2)

## 2021-03-30 LAB — LIPID PANEL
Cholesterol: 327 mg/dL — ABNORMAL HIGH (ref 0–200)
HDL: 51 mg/dL (ref >40)
LDL Cholesterol: 232 mg/dL — ABNORMAL HIGH (ref 0–99)
Total CHOL/HDL Ratio: 6.4 RATIO
Triglycerides: 221 mg/dL — ABNORMAL HIGH (ref <150)
VLDL: 44 mg/dL — ABNORMAL HIGH (ref 0–40)

## 2021-03-30 LAB — COMPREHENSIVE METABOLIC PANEL
ALT: 28 U/L (ref 0–44)
AST: 36 U/L (ref 15–41)
Albumin: 4.1 g/dL (ref 3.5–5.0)
Alkaline Phosphatase: 96 U/L (ref 38–126)
Anion gap: 19 — ABNORMAL HIGH (ref 5–15)
BUN: 10 mg/dL (ref 8–23)
CO2: 25 mmol/L (ref 22–32)
Calcium: 9.6 mg/dL (ref 8.9–10.3)
Chloride: 98 mmol/L (ref 98–111)
Creatinine, Ser: 0.92 mg/dL (ref 0.44–1.00)
GFR, Estimated: >60 mL/min (ref >60)
Glucose, Bld: 190 mg/dL — ABNORMAL HIGH (ref 70–99)
Potassium: 3.6 mmol/L (ref 3.5–5.1)
Sodium: 142 mmol/L (ref 135–145)
Total Bilirubin: 0.7 mg/dL (ref 0.3–1.2)
Total Protein: 7.8 g/dL (ref 6.5–8.1)

## 2021-03-30 LAB — MRSA NEXT GEN BY PCR, NASAL: MRSA by PCR Next Gen: NOT DETECTED

## 2021-03-30 LAB — RESPIRATORY PANEL BY PCR

## 2021-03-30 LAB — ECHOCARDIOGRAM COMPLETE
AR max vel: 1.9 cm2
AV Peak grad: 4.8 mmHg
Ao pk vel: 1.09 m/s
Area-P 1/2: 3.45 cm2
Height: 63 in
S' Lateral: 3 cm
Weight: 2709.01 oz

## 2021-03-30 LAB — HEPARIN LEVEL (UNFRACTIONATED)
Heparin Unfractionated: <0.1 IU/mL — ABNORMAL LOW (ref 0.30–0.70)
Heparin Unfractionated: 0.19 IU/mL — ABNORMAL LOW (ref 0.30–0.70)
Heparin Unfractionated: 0.35 IU/mL (ref 0.30–0.70)

## 2021-03-30 LAB — RESP PANEL BY RT-PCR (FLU A&B, COVID) ARPGX2
Influenza A by PCR: NEGATIVE
Influenza B by PCR: NEGATIVE
SARS Coronavirus 2 by RT PCR: POSITIVE — AB

## 2021-03-30 LAB — TROPONIN I (HIGH SENSITIVITY): Troponin I (High Sensitivity): 1583 ng/L (ref <18)

## 2021-03-30 MED ORDER — ATORVASTATIN CALCIUM 80 MG PO TABS
80.0000 mg | ORAL_TABLET | Freq: Every day | ORAL | Status: DC
  Administered 2021-03-30 – 2021-04-12 (×13): 80 mg via ORAL
  Filled 2021-03-30 (×13): qty 1

## 2021-03-30 MED ORDER — EZETIMIBE 10 MG PO TABS
10.0000 mg | ORAL_TABLET | Freq: Every day | ORAL | Status: DC
  Administered 2021-03-30 – 2021-04-05 (×7): 10 mg via ORAL
  Filled 2021-03-30 (×7): qty 1

## 2021-03-30 MED ORDER — PERFLUTREN LIPID MICROSPHERE
1.0000 mL | INTRAVENOUS | Status: AC | PRN
  Administered 2021-03-30: 2 mL via INTRAVENOUS
  Filled 2021-03-30: qty 10

## 2021-03-30 MED ORDER — DAPAGLIFLOZIN PROPANEDIOL 10 MG PO TABS
10.0000 mg | ORAL_TABLET | Freq: Every day | ORAL | Status: DC
  Administered 2021-03-30 – 2021-04-05 (×7): 10 mg via ORAL
  Filled 2021-03-30 (×8): qty 1

## 2021-03-30 MED ORDER — SPIRONOLACTONE 12.5 MG HALF TABLET
12.5000 mg | ORAL_TABLET | Freq: Every day | ORAL | Status: DC
  Administered 2021-03-31 – 2021-04-05 (×6): 12.5 mg via ORAL
  Filled 2021-03-30 (×6): qty 1

## 2021-03-30 MED FILL — Heparin Sod (Porcine)-NaCl IV Soln 1000 Unit/500ML-0.9%: INTRAVENOUS | Qty: 1000 | Status: AC

## 2021-03-30 NOTE — Progress Notes (Signed)
[facility] for heparin gtt  Indication: chest pain/ACS  Allergies  Allergen Reactions   Compazine [Prochlorperazine] Other (See Comments)   Dilaudid [Hydromorphone] Other (See Comments)   Reglan [Metoclopramide] Other (See Comments)    Patient Measurements: Height: 5\' 3"  (160 cm) Weight: 76.8 kg (169 lb 5 oz) IBW/kg (Calculated) : 52.4 Heparin Dosing Weight: HEPARIN DW (KG): 69   Vital Signs: BP: 111/70 (01/10 1100) Pulse Rate: 65 (01/10 1100)  Labs: Recent Labs    03/29/21 1938 03/30/21 0432 03/30/21 1043  HGB 15.5* 16.1*  --   HCT 45.4 47.9*  --   PLT 332 343  --   HEPARINUNFRC  --  <0.10* 0.19*  CREATININE 1.08* 0.92  --      Estimated Creatinine Clearance: 56.7 mL/min (by C-G formula based on SCr of 0.92 mg/dL).   Medical History: Past Medical History:  Diagnosis Date   Asthma    Colitis    Hypertension    Kidney stones    Migraines     Medications:  Medications Prior to Admission  Medication Sig Dispense Refill Last Dose   albuterol (VENTOLIN HFA) 108 (90 Base) MCG/ACT inhaler Inhale 2 puffs into the lungs every 4 (four) hours as needed for wheezing or shortness of breath.      amphetamine-dextroamphetamine (ADDERALL) 20 MG tablet Take 20 mg by mouth daily.      atorvastatin (LIPITOR) 10 MG tablet Take 10 mg by mouth at bedtime.      budesonide-formoterol (SYMBICORT) 160-4.5 MCG/ACT inhaler Inhale 2 puffs into the lungs 2 (two) times daily.      butalbital-acetaminophen-caffeine (FIORICET) 50-325-40 MG tablet       Cholecalciferol (VITAMIN D-3) 25 MCG (1000 UT) CAPS Take 1 capsule by mouth daily.      clonazePAM (KLONOPIN) 0.5 MG tablet Take 0.5 mg by mouth daily. As needed.      diphenhydrAMINE (BENADRYL) 25 mg capsule Take 25 mg by mouth every 6 (six) hours as needed.      DULoxetine (CYMBALTA) 20 MG capsule 20 mg. Take 60 mg every morning and 30 mg every evening      fenofibrate (TRICOR) 145 MG tablet Take 145  mg by mouth daily.      levothyroxine (SYNTHROID) 25 MCG tablet Take 25 mcg by mouth See admin instructions. Take 1 and 1/2 every morning      loratadine (CLARITIN) 10 MG tablet Take 10 mg by mouth daily.      montelukast (SINGULAIR) 10 MG tablet Take 10 mg by mouth at bedtime.      ondansetron (ZOFRAN) 4 MG tablet Take 4 mg by mouth every 8 (eight) hours as needed for nausea or vomiting.      pantoprazole (PROTONIX) 20 MG tablet Take 1 tablet (20 mg total) by mouth daily. (Patient taking differently: Take 20 mg by mouth 2 (two) times daily.) 30 tablet 1    Probiotic Product (PROBIOTIC ADVANCED PO) Take by mouth in the morning and at bedtime.      promethazine (PHENERGAN) 12.5 MG tablet Take 12.5 mg by mouth every 4 (four) hours as needed.      propranolol (INDERAL) 80 MG tablet Take 80 mg by mouth 2 (two) times daily.      Rimegepant Sulfate (NURTEC PO) Take by mouth as needed.      tiZANidine (ZANAFLEX) 4 MG tablet Take 4 mg by mouth See admin instructions. Take 2 tablets every evening if needed take 1/2 to a whole if  headache is bad      traZODone (DESYREL) 150 MG tablet Take 150 mg by mouth at bedtime.      Scheduled:   aspirin EC  81 mg Oral Daily   atorvastatin  80 mg Oral Daily   Chlorhexidine Gluconate Cloth  6 each Topical Daily   DULoxetine  30 mg Oral QHS   DULoxetine  60 mg Oral Daily   fenofibrate  160 mg Oral Daily   fluticasone furoate-vilanterol  1 puff Inhalation Daily   levothyroxine  37.5 mcg Oral Q0600   loratadine  10 mg Oral Daily   losartan  50 mg Oral Daily   mouth rinse  15 mL Mouth Rinse BID   montelukast  10 mg Oral QHS   pantoprazole  40 mg Oral BID   propranolol  80 mg Oral BID   sodium chloride flush  3 mL Intravenous Q12H   traZODone  150 mg Oral QHS   Infusions:   sodium chloride 10 mL/hr at 03/30/21 1100   heparin 750 Units/hr (03/30/21 1100)   nitroGLYCERIN 20 mcg/min (03/30/21 1100)   PRN: sodium chloride, acetaminophen, albuterol,  butalbital-acetaminophen-caffeine, clonazePAM, diphenhydrAMINE, heparin sodium (porcine), influenza vaccine adjuvanted, nitroGLYCERIN, ondansetron (ZOFRAN) IV, ondansetron, sodium chloride flush, tiZANidine Anti-infectives (From admission, onward)    None       Assessment: Meredith Guerra a 71 y.o. female requires anticoagulation with a heparin iv infusion for the indication of  chest pain/ACS. Heparin gtt will be started following pharmacy protocol per pharmacy consult.   Patient transferred to [facility] for cath as a code stemi and found to have multivessel disease and will have surgical consult. Heparin level came back low at 0.19. No s/sx of bleeding per nurse. Hgb and PLTs stable.  Goal of Therapy:  Heparin level 0.3-0.7 units/ml Monitor platelets by anticoagulation protocol: Yes   Plan:  Increase heparin infusion to 950 units/hr Check heparin level in 6 hours and daily Continue to monitor H&H, platelets, s/sx of bleeding daily F/up surgical plan  Debria Garret, Student Pharmacist  03/30/2021 12:27 PM

## 2021-03-30 NOTE — Consult Note (Addendum)
[address]       [place] [address]             [phone]        Ketina L Dacosta [facility] Record [id number] Date of Birth: 1950-12-01  Referring: [place] Primary Care: Redmond School, MD Primary Cardiologist:None  Chief Complaint:    Chief Complaint  Patient presents with   Chest Pain    History of Present Illness:      Meredith Guerra is a 72 yo obese female with history of HTN, hypothyroidism, Depression,  and Asthma.  She also has a recent diagnosis of COVID 19, testing postitive on 1/5.  She developed complaints of sudden onset chest pain with associated nausea, shortness of breath, diaphoresis.  The pain also radiated to his left arm and jaw.  EKG showed anterolateral ST elevation.  She was transferred emergently to [facility] for further workup.  She was taken to the catheterization lab and was found to have LM disease.  She was started on Heparin and admitted to the ICU for further care.  Cardiothoracic consultation is being requested for coronary bypass grafting procedure.  The patient developed fever with severe URI symptoms.  She continued to feel poorly and ultimately was tested for COVID and was positive.  She has been vaccinated with Moderna x 3.  The patient was treated with anti-viral.  The patient is currently chest pain free.  She denies history of smoking.  She is not a diabetic.  Prior to the recent COVID illness and chest pain the patient was able to do whatever she needs to do.  The patient also is currently being worked up by GI for difficulty swallowing/clearing food after eating.  She also states that she is claustrophobic and is fearful of being on a ventilator.  Current Activity/ Functional Status: Patient is independent with mobility/ambulation, transfers, ADL's, IADL's.   Zubrod Score: At the time of surgery this patients most appropriate activity status/level should be described as: []     0    Normal activity, no  symptoms [x]     1    Restricted in physical strenuous activity but ambulatory, able to do out light work []     2    Ambulatory and capable of self care, unable to do work activities, up and about                 more than 50%  Of the time                            []     3    Only limited self care, in bed greater than 50% of waking hours []     4    Completely disabled, no self care, confined to bed or chair []     5    Moribund  Past Medical History:  Diagnosis Date   Asthma    Colitis    Hypertension    Kidney stones    Migraines     Past Surgical History:  Procedure Laterality Date   ABDOMINAL HYSTERECTOMY     LEFT HEART CATH AND CORONARY ANGIOGRAPHY N/A 03/29/2021   Procedure: LEFT HEART CATH AND CORONARY ANGIOGRAPHY;  Surgeon: Nigel Mormon, MD;  Location: [facility];  Service: Cardiovascular;  Laterality: N/A;   SHOULDER SURGERY     TONSILLECTOMY      Social History  Tobacco Use  Smoking Status Never  Smokeless Tobacco Never    Social History   Substance and Sexual Activity  Alcohol Use Not Currently     Allergies  Allergen Reactions   Compazine [Prochlorperazine] Other (See Comments)   Dilaudid [Hydromorphone] Other (See Comments)   Reglan [Metoclopramide] Other (See Comments)    Current Facility-Administered Medications  Medication Dose Route Frequency Provider Last Rate Last Admin   0.9 %  sodium chloride infusion  250 mL Intravenous PRN Patwardhan, Manish J, MD 10 mL/hr at 03/30/21 0828 250 mL at 03/30/21 0828   acetaminophen (TYLENOL) tablet 650 mg  650 mg Oral Q4H PRN Patwardhan, Manish J, MD   650 mg at 03/30/21 0611   albuterol (VENTOLIN HFA) 108 (90 Base) MCG/ACT inhaler 2 puff  2 puff Inhalation Q4H PRN Patwardhan, Anabel Bene, MD   2 puff at 03/29/21 2242   aspirin EC tablet 81 mg  81 mg Oral Daily Patwardhan, Manish J, MD   81 mg at 03/30/21 0914   atorvastatin (LIPITOR) tablet 80 mg  80 mg Oral Daily Patwardhan, Manish J, MD        butalbital-acetaminophen-caffeine (FIORICET) 50-325-40 MG per tablet 1 tablet  1 tablet Oral Q6H PRN Patwardhan, Manish J, MD       Chlorhexidine Gluconate Cloth 2 % PADS 6 each  6 each Topical Daily Patwardhan, Manish J, MD   6 each at 03/30/21 0155   clonazePAM (KLONOPIN) tablet 0.5 mg  0.5 mg Oral Daily PRN Patwardhan, Manish J, MD       diphenhydrAMINE (BENADRYL) capsule 25 mg  25 mg Oral Q6H PRN Patwardhan, Manish J, MD       DULoxetine (CYMBALTA) DR capsule 30 mg  30 mg Oral QHS Patwardhan, Manish J, MD   30 mg at 03/29/21 2251   DULoxetine (CYMBALTA) DR capsule 60 mg  60 mg Oral Daily Patwardhan, Manish J, MD   60 mg at 03/30/21 0914   fenofibrate tablet 160 mg  160 mg Oral Daily Patwardhan, Manish J, MD   160 mg at 03/30/21 0914   fluticasone furoate-vilanterol (BREO ELLIPTA) 200-25 MCG/ACT 1 puff  1 puff Inhalation Daily Patwardhan, Manish J, MD   1 puff at 03/30/21 0809   heparin ADULT infusion 100 units/mL (25000 units/262mL)  750 Units/hr Intravenous Continuous Earnie Larsson, RPH 7.5 mL/hr at 03/30/21 0700 750 Units/hr at 03/30/21 0700   heparin sodium (porcine) injection    PRN Elder Negus, MD   4,000 Units at 03/29/21 2115   influenza vaccine adjuvanted (FLUAD) injection 0.5 mL  0.5 mL Intramuscular Prior to discharge Patwardhan, Anabel Bene, MD       levothyroxine (SYNTHROID) tablet 37.5 mcg  37.5 mcg Oral Q0600 Patwardhan, Manish J, MD   37.5 mcg at 03/30/21 0610   loratadine (CLARITIN) tablet 10 mg  10 mg Oral Daily Patwardhan, Manish J, MD   10 mg at 03/30/21 0914   losartan (COZAAR) tablet 50 mg  50 mg Oral Daily Patwardhan, Manish J, MD   50 mg at 03/30/21 0914   MEDLINE mouth rinse  15 mL Mouth Rinse BID Patwardhan, Manish J, MD   15 mL at 03/30/21 0924   montelukast (SINGULAIR) tablet 10 mg  10 mg Oral QHS Patwardhan, Manish J, MD   10 mg at 03/29/21 2248   nitroGLYCERIN (NITROSTAT) SL tablet 0.4 mg  0.4 mg Sublingual Q5 Min x 3 PRN Gloris Manchester, MD   0.4 mg at 03/29/21  2340  nitroGLYCERIN 50 mg in dextrose 5 % 250 mL (0.2 mg/mL) infusion  0-200 mcg/min Intravenous Titrated Patwardhan, Manish J, MD 9 mL/hr at 03/30/21 0700 30 mcg/min at 03/30/21 0700   ondansetron (ZOFRAN) injection 4 mg  4 mg Intravenous Q6H PRN Patwardhan, Manish J, MD   4 mg at 03/30/21 0332   ondansetron (ZOFRAN) tablet 4 mg  4 mg Oral Q8H PRN Patwardhan, Manish J, MD       pantoprazole (PROTONIX) EC tablet 40 mg  40 mg Oral BID Patwardhan, Manish J, MD   40 mg at 03/30/21 0914   perflutren lipid microspheres (DEFINITY) IV suspension  1-10 mL Intravenous PRN Patwardhan, Reynold Bowen, MD   2 mL at 03/30/21 0919   propranolol (INDERAL) tablet 80 mg  80 mg Oral BID Patwardhan, Manish J, MD   80 mg at 03/30/21 0914   sodium chloride flush (NS) 0.9 % injection 3 mL  3 mL Intravenous Q12H Patwardhan, Manish J, MD   3 mL at 03/30/21 0221   sodium chloride flush (NS) 0.9 % injection 3 mL  3 mL Intravenous PRN Patwardhan, Manish J, MD       tiZANidine (ZANAFLEX) tablet 8 mg  8 mg Oral Daily PRN Patwardhan, Manish J, MD       traZODone (DESYREL) tablet 150 mg  150 mg Oral QHS Patwardhan, Manish J, MD   150 mg at 03/29/21 2248    Medications Prior to Admission  Medication Sig Dispense Refill Last Dose   albuterol (VENTOLIN HFA) 108 (90 Base) MCG/ACT inhaler Inhale 2 puffs into the lungs every 4 (four) hours as needed for wheezing or shortness of breath.      amphetamine-dextroamphetamine (ADDERALL) 20 MG tablet Take 20 mg by mouth daily.      atorvastatin (LIPITOR) 10 MG tablet Take 10 mg by mouth at bedtime.      budesonide-formoterol (SYMBICORT) 160-4.5 MCG/ACT inhaler Inhale 2 puffs into the lungs 2 (two) times daily.      butalbital-acetaminophen-caffeine (FIORICET) 50-325-40 MG tablet       Cholecalciferol (VITAMIN D-3) 25 MCG (1000 UT) CAPS Take 1 capsule by mouth daily.      clonazePAM (KLONOPIN) 0.5 MG tablet Take 0.5 mg by mouth daily. As needed.      diphenhydrAMINE (BENADRYL) 25 mg capsule  Take 25 mg by mouth every 6 (six) hours as needed.      DULoxetine (CYMBALTA) 20 MG capsule 20 mg. Take 60 mg every morning and 30 mg every evening      fenofibrate (TRICOR) 145 MG tablet Take 145 mg by mouth daily.      levothyroxine (SYNTHROID) 25 MCG tablet Take 25 mcg by mouth See admin instructions. Take 1 and 1/2 every morning      loratadine (CLARITIN) 10 MG tablet Take 10 mg by mouth daily.      montelukast (SINGULAIR) 10 MG tablet Take 10 mg by mouth at bedtime.      ondansetron (ZOFRAN) 4 MG tablet Take 4 mg by mouth every 8 (eight) hours as needed for nausea or vomiting.      pantoprazole (PROTONIX) 20 MG tablet Take 1 tablet (20 mg total) by mouth daily. (Patient taking differently: Take 20 mg by mouth 2 (two) times daily.) 30 tablet 1    Probiotic Product (PROBIOTIC ADVANCED PO) Take by mouth in the morning and at bedtime.      promethazine (PHENERGAN) 12.5 MG tablet Take 12.5 mg by mouth every 4 (four) hours as needed.  propranolol (INDERAL) 80 MG tablet Take 80 mg by mouth 2 (two) times daily.      Rimegepant Sulfate (NURTEC PO) Take by mouth as needed.      tiZANidine (ZANAFLEX) 4 MG tablet Take 4 mg by mouth See admin instructions. Take 2 tablets every evening if needed take 1/2 to a whole if headache is bad      traZODone (DESYREL) 150 MG tablet Take 150 mg by mouth at bedtime.       Family History  Family history unknown: Yes   Review of Systems:      Cardiac Review of Systems: Y or  [    ]= no  Chest Pain [ Y   ]  Resting SOB [   ] Exertional SOB  [  ]  Orthopnea [  ]   Pedal Edema [ N ]    Palpitations [ N ] Syncope  [  ]   Presyncope [   ]  General Review of Systems: [Y] = yes [  ]=no Constitional: recent weight change [  ]; anorexia [  ]; fatigue [Y  ]; nausea [ Y ]; night sweats [  ]; fever [  ]; or chills [  ]                                                               Dental: Last Dentist visit:   Eye : blurred vision [  ]; diplopia [   ]; vision changes [   ];  Amaurosis fugax[  ]; Resp: cough [ Y ];  wheezing[ Y, asthma ];  hemoptysis[  ]; shortness of breath[  ]; paroxysmal nocturnal dyspnea[  ]; dyspnea on exertion[  ]; or orthopnea[  ];  GI:  gallstones[  ], vomiting[N  ];  dysphagia[  ]; melena[  ];  hematochezia [  ]; heartburn[  ];   Hx of  Colonoscopy[  ]; GU: kidney stones [  ]; hematuria[  ];   dysuria [  ];  nocturia[  ];  history of     obstruction [  ]; urinary frequency [  ]             Skin: rash, swelling[ N ];, hair loss[  ];  peripheral edema[ N ];  or itching[  ]; Musculosketetal: myalgias[  ];  joint swelling[  ];  joint erythema[  ];  joint pain[  ];  back pain[ Y ];  Heme/Lymph: bruising[  ];  bleeding[  ];  anemia[  ];  Neuro: TIA[  ];  headaches[  ];  stroke[  ];  vertigo[  ];  seizures[  ];   paresthesias[  ];  difficulty walking[  ];  Psych:depression[ Y ]; anxiety[  ];  Endocrine: diabetes[  ];  thyroid dysfunction[ Y ];  Physical Exam: BP 99/69    Pulse 64    Temp 98.4 F (36.9 C) (Oral)    Resp 17    Ht 5\' 3"  (1.6 m)    Wt 76.8 kg    SpO2 98%    BMI 29.99 kg/m   General appearance: alert, cooperative, and no distress Head: Normocephalic, without obvious abnormality, atraumatic Neck: no adenopathy, no carotid bruit, no JVD, supple, symmetrical, trachea midline, and thyroid not enlarged, symmetric, no  tenderness/mass/nodules Resp: clear to auscultation bilaterally Cardio: regular rate and rhythm GI: soft, non-tender; bowel sounds normal; no masses,  no organomegaly Extremities: extremities normal, atraumatic, no cyanosis or edema Neurologic: Grossly normal  Diagnostic Studies & Laboratory data:     Recent Radiology Findings:   CARDIAC CATHETERIZATION  Result Date: 03/29/2021 Images from the original result were not included. LM: 90% distal stenosis at the trifurcation LAD: Proximal focal 80% stenosis at bifurcation with large septal perforator branch, focal mid 60% stenosis Ramus intermedius: No significant  disease Left circumflex: Mid 30% stenosis RCA: Moderate diffuse disease in small caliber RPDA Aspirin Aggrastat for next 4 hours, then IV heparin IV nitroglycerin BP control CVTS consult for CABG Nigel Mormon, MD Pager: [phone] Office: [phone]  DG Chest Portable 1 View  Result Date: 03/29/2021 CLINICAL DATA:  Chest pain EXAM: PORTABLE CHEST 1 VIEW COMPARISON:  10/10/2020 FINDINGS: Heart is normal size. Patchy bilateral upper lobe airspace opacities. No effusions. No acute bony abnormality. IMPRESSION: Patchy bilateral upper lobe opacities, right greater than left. Electronically Signed   By: Rolm Baptise M.D.   On: 03/29/2021 22:07     I have independently reviewed the above radiologic studies and discussed with the patient   Recent Lab Findings: Lab Results  Component Value Date   WBC 12.0 (H) 03/30/2021   HGB 16.1 (H) 03/30/2021   HCT 47.9 (H) 03/30/2021   PLT 343 03/30/2021   GLUCOSE 190 (H) 03/30/2021   CHOL 327 (H) 03/30/2021   TRIG 221 (H) 03/30/2021   HDL 51 03/30/2021   LDLCALC 232 (H) 03/30/2021   ALT 28 03/30/2021   AST 36 03/30/2021   NA 142 03/30/2021   K 3.6 03/30/2021   CL 98 03/30/2021   CREATININE 0.92 03/30/2021   BUN 10 03/30/2021   CO2 25 03/30/2021   TSH 2.84 01/28/2021    Assessment / Plan:      CAD- LM disease- patient is currently chest pain free on IV heparin and NTG COVID 19- patient symptoms began on Monday 1/3, tested positive 1/5 has been treated with antiviral, symptoms are improved HTN- on Cozaar H/O Hypothyroidism Asthma- home inhalers ordered Dispo- patient stable, currently chest pain free with LM disease, she will require CABG in the near future... Dr. Prescott Gum will evaluate patient.Marland Kitchen tentatively scheduled for surgery next Tuesday 1/17  I  spent 55 minutes counseling the patient face to face.  Ellwood Handler, PA-C 03/30/2021 9:43 AM  Patient examined, images of coronary angiogram and echocardiogram personally reviewed and  discussed with patient and husband. 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  She is recovering from very symptomatic COVID-19 pneumonitis and general viral syndrome for which she was taking Paxlovid with improvement in her symptoms when she started develop severe crushing chest pain and presented to the ED.  She had EKG changes and underwent emergency catheterization under the STEMI protocol.  She was found to have a 90% left main stenosis.  The right coronary was small codominant without disease.  She was placed on heparin and nitroglycerin with improvement in her symptoms.  Her cardiac enzymes are still rising the last troponin about 1500.  Patient had an echocardiogram today which shows apical inferior anterior lateral hypokinesia without valvular disease.  EF 35%.  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  Cardiac catheterization at that time  showed no CAD.  We do not have any records of her echocardiogram at that time and she has not had any echocardiogram performed in the [facility] system.  She was treated with increased dose of propanolol for her Takotsubo cardiomyopathy.  She does state that is recently has 2 months ago she was very active with good exercise tolerance and no cardiac symptoms and minimal pulmonary symptoms related to her chronic asthma.  The patient will need surgical coronary revascularization once her pulmonary inflammation from the recent COVID-19 resolves.  Chest x-ray yesterday showed bilateral apical infiltrates.  She is currently on 2 L nasal cannula breathing comfortably.  She remains on IV heparin and nitroglycerin until her surgery.  We will obtain carotid pre-CABG Dopplers and she will continue her nebulized therapy for her chronic lung disease. Surgery tentatively scheduled for next  Tuesday, January 17.   patient examined and medical record reviewed,agree with above note. Meredith Guerra 03/30/2021

## 2021-03-30 NOTE — TOC Benefit Eligibility Note (Signed)
Patient Teacher, English as a foreign language completed.    The patient is currently admitted and upon discharge could be taking Entresto 24-26 mg.  The current 30 day co-pay is, $47.00.   The patient is currently admitted and upon discharge could be taking Farxiga 10 mg.  The current 30 day co-pay is, $11.00.   The patient is currently admitted and upon discharge could be taking Jardiance 10 mg.  The current 30 day co-pay is, $11.00.   The patient is insured through [place] Patient Advocate Specialist [place] Patient Advocate Team Direct Number: [phone]  Fax: [phone]

## 2021-03-30 NOTE — Progress Notes (Signed)
[facility] for heparin gtt  Indication: chest pain/ACS  Allergies  Allergen Reactions   Compazine [Prochlorperazine] Other (See Comments)   Dilaudid [Hydromorphone] Other (See Comments)   Reglan [Metoclopramide] Other (See Comments)    Patient Measurements: Height: 5\' 3"  (160 cm) Weight: 76.8 kg (169 lb 5 oz) IBW/kg (Calculated) : 52.4 Heparin Dosing Weight: HEPARIN DW (KG): 69   Vital Signs: Temp: 98.2 F (36.8 C) (01/10 1738) Temp Source: Oral (01/10 1738) BP: 100/65 (01/10 1738) Pulse Rate: 65 (01/10 1738)  Labs: Recent Labs    03/29/21 1938 03/30/21 0432 03/30/21 1043 03/30/21 1840  HGB 15.5* 16.1*  --   --   HCT 45.4 47.9*  --   --   PLT 332 343  --   --   HEPARINUNFRC  --  <0.10* 0.19* 0.35  CREATININE 1.08* 0.92  --   --      Estimated Creatinine Clearance: 56.7 mL/min (by C-G formula based on SCr of 0.92 mg/dL).   Medical History: Past Medical History:  Diagnosis Date   Asthma    Colitis    Hypertension    Kidney stones    Migraines     Medications:  Medications Prior to Admission  Medication Sig Dispense Refill Last Dose   albuterol (VENTOLIN HFA) 108 (90 Base) MCG/ACT inhaler Inhale 2 puffs into the lungs every 4 (four) hours as needed for wheezing or shortness of breath.      amphetamine-dextroamphetamine (ADDERALL) 20 MG tablet Take 20 mg by mouth daily.      atorvastatin (LIPITOR) 10 MG tablet Take 10 mg by mouth at bedtime.      budesonide-formoterol (SYMBICORT) 160-4.5 MCG/ACT inhaler Inhale 2 puffs into the lungs 2 (two) times daily.      butalbital-acetaminophen-caffeine (FIORICET) 50-325-40 MG tablet       Cholecalciferol (VITAMIN D-3) 25 MCG (1000 UT) CAPS Take 1 capsule by mouth daily.      clonazePAM (KLONOPIN) 0.5 MG tablet Take 0.5 mg by mouth daily. As needed.      diphenhydrAMINE (BENADRYL) 25 mg capsule Take 25 mg by mouth every 6 (six) hours as needed.      DULoxetine (CYMBALTA) 20 MG  capsule 20 mg. Take 60 mg every morning and 30 mg every evening      fenofibrate (TRICOR) 145 MG tablet Take 145 mg by mouth daily.      levothyroxine (SYNTHROID) 25 MCG tablet Take 25 mcg by mouth See admin instructions. Take 1 and 1/2 every morning      loratadine (CLARITIN) 10 MG tablet Take 10 mg by mouth daily.      montelukast (SINGULAIR) 10 MG tablet Take 10 mg by mouth at bedtime.      ondansetron (ZOFRAN) 4 MG tablet Take 4 mg by mouth every 8 (eight) hours as needed for nausea or vomiting.      pantoprazole (PROTONIX) 20 MG tablet Take 1 tablet (20 mg total) by mouth daily. (Patient taking differently: Take 20 mg by mouth 2 (two) times daily.) 30 tablet 1    Probiotic Product (PROBIOTIC ADVANCED PO) Take by mouth in the morning and at bedtime.      promethazine (PHENERGAN) 12.5 MG tablet Take 12.5 mg by mouth every 4 (four) hours as needed.      propranolol (INDERAL) 80 MG tablet Take 80 mg by mouth 2 (two) times daily.      Rimegepant Sulfate (NURTEC PO) Take by mouth as needed.  tiZANidine (ZANAFLEX) 4 MG tablet Take 4 mg by mouth See admin instructions. Take 2 tablets every evening if needed take 1/2 to a whole if headache is bad      traZODone (DESYREL) 150 MG tablet Take 150 mg by mouth at bedtime.      Scheduled:   aspirin EC  81 mg Oral Daily   atorvastatin  80 mg Oral Daily   Chlorhexidine Gluconate Cloth  6 each Topical Daily   dapagliflozin propanediol  10 mg Oral Daily   DULoxetine  30 mg Oral QHS   DULoxetine  60 mg Oral Daily   ezetimibe  10 mg Oral Daily   fenofibrate  160 mg Oral Daily   fluticasone furoate-vilanterol  1 puff Inhalation Daily   levothyroxine  37.5 mcg Oral Q0600   loratadine  10 mg Oral Daily   mouth rinse  15 mL Mouth Rinse BID   montelukast  10 mg Oral QHS   pantoprazole  40 mg Oral BID   sodium chloride flush  3 mL Intravenous Q12H   [START ON 03/31/2021] spironolactone  12.5 mg Oral Daily   traZODone  150 mg Oral QHS   Infusions:    sodium chloride 10 mL/hr at 03/30/21 1500   heparin 950 Units/hr (03/30/21 1500)   nitroGLYCERIN 25 mcg/min (03/30/21 1500)   PRN: sodium chloride, acetaminophen, albuterol, butalbital-acetaminophen-caffeine, clonazePAM, diphenhydrAMINE, heparin sodium (porcine), influenza vaccine adjuvanted, nitroGLYCERIN, ondansetron (ZOFRAN) IV, ondansetron, sodium chloride flush, tiZANidine Anti-infectives (From admission, onward)    None       Assessment: Meredith Guerra a 71 y.o. female requires anticoagulation with a heparin iv infusion for the indication of  chest pain/ACS. Heparin gtt will be started following pharmacy protocol per pharmacy consult.   Patient transferred to [facility] for cath as a code stemi and found to have multivessel disease and will have surgical consult. No s/sx of bleeding per nurse. Hgb and PLTs stable.  Heparin level came back therapeutic tonight. Plan for CABG once stable with COVID infection. We will continue with the current rate and check in AM.  Goal of Therapy:  Heparin level 0.3-0.7 units/ml Monitor platelets by anticoagulation protocol: Yes   Plan:  Cont heparin infusion to 950 units/hr F/u heparin level in AM Continue to monitor H&H, platelets, s/sx of bleeding daily F/up surgical plan  [name], PharmD, BCIDP, AAHIVP, CPP Infectious Disease Pharmacist 03/30/2021 7:32 PM

## 2021-03-30 NOTE — Progress Notes (Signed)
Chaplain Benetta Spar attempted initial consultation with 71 y.o. Female. Spoke with patient's nurse, Liborio Nixon, who advised of patients current isolation status. Nurse was checking with Infection Control to see if patient's status could be changed. Patient had visitors with her in the room at this time, so I will wait until more appropriate time. Nurse advised that patient would probably be here for extended period of time, at which time it might be more suitable.

## 2021-03-30 NOTE — Progress Notes (Incomplete)
Echocardiogram 2D Echocardiogram has been performed.  Eduard Roux 03/30/2021, 9:19 AM

## 2021-03-30 NOTE — Progress Notes (Signed)
Subjective:  Feels well this morning.  Chest pain improved.  Blood pressure better controlled.  Objective:  Vital Signs in the last 24 hours: Temp:  [98.4 F (36.9 C)] 98.4 F (36.9 C) (01/09 1921) Pulse Rate:  [0-88] 64 (01/10 0914) Resp:  [12-22] 17 (01/10 0800) BP: (93-194)/(69-120) 99/69 (01/10 0914) SpO2:  [86 %-100 %] 98 % (01/10 0810) Weight:  [76.8 kg-77 kg] 76.8 kg (01/10 0500)  Intake/Output from previous day: 01/09 0701 - 01/10 0700 In: 598.8 [I.V.:495.6; IV Piggyback:103.3] Out: 1450 [Urine:1450]  Physical Exam Vitals and nursing note reviewed.  Constitutional:      General: She is not in acute distress.    Appearance: She is well-developed.  HENT:     Head: Normocephalic and atraumatic.  Eyes:     Conjunctiva/sclera: Conjunctivae normal.     Pupils: Pupils are equal, round, and reactive to light.  Neck:     Vascular: No JVD.  Cardiovascular:     Rate and Rhythm: Normal rate and regular rhythm.     Pulses: Intact distal pulses.          Dorsalis pedis pulses are 0 on the right side and 0 on the left side.       Posterior tibial pulses are 1+ on the right side and 1+ on the left side.     Heart sounds: No murmur heard. Pulmonary:     Effort: Pulmonary effort is normal.     Breath sounds: Normal breath sounds. No wheezing or rales.  Abdominal:     General: Bowel sounds are normal.     Palpations: Abdomen is soft.     Tenderness: There is no rebound.  Musculoskeletal:        General: No tenderness. Normal range of motion.     Left lower leg: No edema.  Lymphadenopathy:     Cervical: No cervical adenopathy.  Skin:    General: Skin is warm and dry.  Neurological:     Mental Status: She is alert and oriented to person, place, and time.     Cranial Nerves: No cranial nerve deficit.     Lab Results: BMP Recent Labs    06/20/20 1417 01/28/21 1151 03/29/21 1938 03/30/21 0432  NA 141 142 139 142  K 3.6 4.5 3.6 3.6  CL 104 102 100 98  CO2 26 25 27  25   GLUCOSE 134* 143* 150* 190*  BUN 18 16 15 10   CREATININE 1.11* 1.01 1.08* 0.92  CALCIUM 9.9 10.1 9.7 9.6  GFRNONAA 54*  --  56* >60    CBC Recent Labs  Lab 03/30/21 0432  WBC 12.0*  RBC 5.63*  HGB 16.1*  HCT 47.9*  PLT 343  MCV 85.1  MCH 28.6  MCHC 33.6  RDW 14.0     Cardiac Panel (last 3 results)  Latest Reference Range & Units 03/29/21 19:38 03/30/21 04:32  Troponin I (High Sensitivity) <18 ng/L 392 (HH) 1,583 (HH)  [facility]): Data is critically high   TSH Recent Labs    01/28/21 1151  TSH 2.84    Lipid Panel     Component Value Date/Time   CHOL 327 (H) 03/30/2021 0432   TRIG 221 (H) 03/30/2021 0432   HDL 51 03/30/2021 0432   CHOLHDL 6.4 03/30/2021 0432   VLDL 44 (H) 03/30/2021 0432   LDLCALC 232 (H) 03/30/2021 0432     Hepatic Function Panel Recent Labs    01/28/21 1151 03/30/21 0432  PROT 7.6 7.8  ALBUMIN  --  4.1  AST 25 36  ALT 26 28  ALKPHOS  --  96  BILITOT 0.5 0.7    Imaging: CXR 03/29/2021: Heart is normal size. Patchy bilateral upper lobe airspace opacities. No effusions. No acute bony abnormality.   IMPRESSION: Patchy bilateral upper lobe opacities, right greater than left.    Cardiac Studies:  EKG 03/29/2021: Sinus rhythm.  Acute anterolateral injury  Echocardiogram: Pending  Assessment & Recommendations:  71 y.o. Caucasian female  with hypertension, mixed hyperlipidemia, asthma, remote history of Takotsubo cardiomyopathy, recent diagnosis of COVID, completed 5-day therapy with PACs elevated on 03/29/2021, admitted with acute coronary syndrome  Acute coronary syndrome: Initially presented with ST elevation on EKG.  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 Hemodynamically stable. Continue aspirin, heparin, nitroglycerin, metoprolol, losartan. Echocardiogram pending. CVTS consulted for CABG. given recent diagnosis of  COVID, we may have to wait for few days until isolation is over.  Mixed hyperlipidemia: LDL 232.  At baseline, patient was on Tricor 145 mg, Lipitor 10 mg daily. Increase Lipitor to 80 mg daily, added Zetia 10 mg. She will need Repatha outpatient.  Transfer to progressive unit today    Elder Negus, MD Pager: [phone] Office: [phone]

## 2021-03-30 NOTE — Progress Notes (Signed)
Reviewed echocardiogram. Preserved basal wall motion with severe mid to apical hypokinesis/akinesis of entire heart. In the setting of severe CAD, ischemic cardiomyopathy. EF 30-35%.   BP are low normal now.  Stop propranolol, losartan. Add spironolactone 12.5 mg daily.  Add Farxiga 10 mg daily.  If BP would allow, would consider Entresto.  Awaiting CABG, will consider getting MRI.   Elder Negus, MD Pager: [phone] Office: [phone]

## 2021-03-31 ENCOUNTER — Inpatient Hospital Stay (HOSPITAL_COMMUNITY): Payer: Medicare Other

## 2021-03-31 DIAGNOSIS — I428 Other cardiomyopathies: Secondary | ICD-10-CM

## 2021-03-31 LAB — BASIC METABOLIC PANEL
Anion gap: 11 (ref 5–15)
BUN: 15 mg/dL (ref 8–23)
CO2: 28 mmol/L (ref 22–32)
Calcium: 9.4 mg/dL (ref 8.9–10.3)
Chloride: 100 mmol/L (ref 98–111)
Creatinine, Ser: 1.07 mg/dL — ABNORMAL HIGH (ref 0.44–1.00)
GFR, Estimated: 56 mL/min — ABNORMAL LOW (ref >60)
Glucose, Bld: 132 mg/dL — ABNORMAL HIGH (ref 70–99)
Potassium: 3.3 mmol/L — ABNORMAL LOW (ref 3.5–5.1)
Sodium: 139 mmol/L (ref 135–145)

## 2021-03-31 LAB — TROPONIN I (HIGH SENSITIVITY): Troponin I (High Sensitivity): 1309 ng/L (ref <18)

## 2021-03-31 LAB — CBC
HCT: 41.1 % (ref 36.0–46.0)
Hemoglobin: 13.7 g/dL (ref 12.0–15.0)
MCH: 28.6 pg (ref 26.0–34.0)
MCHC: 33.3 g/dL (ref 30.0–36.0)
MCV: 85.8 fL (ref 80.0–100.0)
Platelets: 256 10*3/uL (ref 150–400)
RBC: 4.79 MIL/uL (ref 3.87–5.11)
RDW: 14 % (ref 11.5–15.5)
WBC: 12.9 10*3/uL — ABNORMAL HIGH (ref 4.0–10.5)
nRBC: 0 % (ref 0.0–0.2)

## 2021-03-31 LAB — HEPARIN LEVEL (UNFRACTIONATED): Heparin Unfractionated: 0.47 IU/mL (ref 0.30–0.70)

## 2021-03-31 IMAGING — MR MR CARD MORPHOLOGY WO/W CM
45 of 48 series · 45 of 48 positions shown · IV contrast (Contrast agent)
Comparison: none

CLINICAL DATA: 70F with HTN, HLD, history of Taktsubo
cardiomyopathy, recent COVID p/w STEMI. Cath showed 80% distal left
main, 80% proximal LAD, 60% mid LAD stenosis

EXAM:
CARDIAC MRI
TECHNIQUE: The patient was scanned on a 1.5 Tesla Siemens magnet. A dedicated
cardiac coil was used. Functional imaging was done using Fiesta
sequences. [DATE], and 4 chamber views were done to assess for RWMA's.
Modified ANDRIAVID rule using a short axis stack was used to
calculate an ejection fraction on a dedicated work station using
Circle software. The patient received 8 cc of Gadavist. After 10
minutes inversion recovery sequences were used to assess for
infiltration and scar tissue.
CONTRAST:  8 cc  of Gadavist

[Series 4: [id number] · axial · 8.0mm · 1.48mm/px · 1 of 16 slices shown]
[im 1/16]
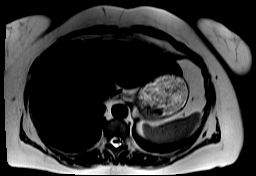

[Series 8: bSSFP · oblique · 8.0mm · 1.70mm/px · 1 of 25 slices shown (1 of 19)]
[im 1/25]
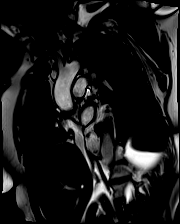

[Series 9: bSSFP · oblique · 8.0mm · 1.70mm/px · 1 of 25 slices shown (2 of 19)]
[im 1/25]
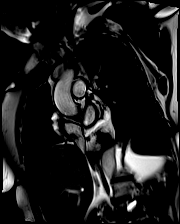

[Series 10: bSSFP · oblique · 8.0mm · 1.70mm/px · 1 of 25 slices shown (3 of 19)]
[im 1/25]
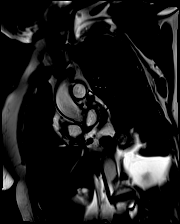

[Series 11: bSSFP · oblique · 8.0mm · 1.70mm/px · 1 of 25 slices shown (4 of 19)]
[im 1/25]
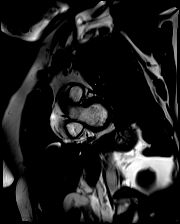

[Series 12: bSSFP · oblique · 8.0mm · 1.70mm/px · 1 of 25 slices shown (5 of 19)]
[im 1/25]
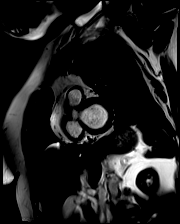

[Series 13: bSSFP · oblique · 8.0mm · 1.70mm/px · 1 of 25 slices shown (6 of 19)]
[im 1/25]
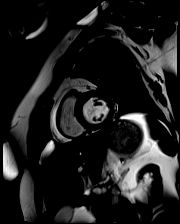

[Series 14: bSSFP · oblique · 8.0mm · 1.70mm/px · 1 of 25 slices shown (7 of 19)]
[im 1/25]
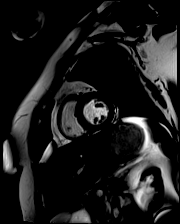

[Series 15: bSSFP · oblique · 8.0mm · 1.70mm/px · 1 of 25 slices shown (8 of 19)]
[im 1/25]
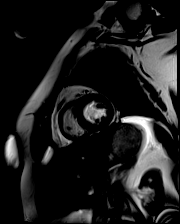

[Series 16: bSSFP · oblique · 8.0mm · 1.70mm/px · 1 of 25 slices shown (9 of 19)]
[im 1/25]
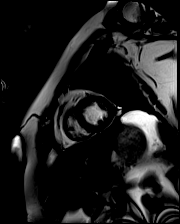

[Series 17: bSSFP · oblique · 8.0mm · 1.70mm/px · 1 of 25 slices shown (10 of 19)]
[im 1/25]
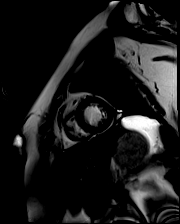

[Series 18: bSSFP · oblique · 8.0mm · 1.70mm/px · 1 of 25 slices shown (11 of 19)]
[im 1/25]
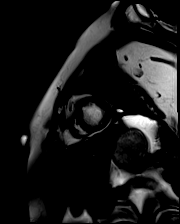

[Series 19: bSSFP · oblique · 8.0mm · 1.70mm/px · 1 of 25 slices shown (12 of 19)]
[im 1/25]
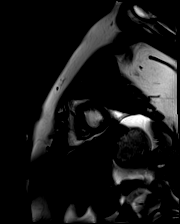

[Series 20: bSSFP · oblique · 8.0mm · 1.70mm/px · 1 of 25 slices shown (13 of 19)]
[im 1/25]
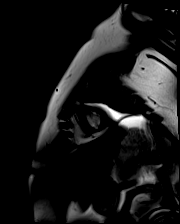

[Series 21: bSSFP · oblique · 8.0mm · 1.70mm/px · 1 of 25 slices shown (14 of 19)]
[im 1/25]
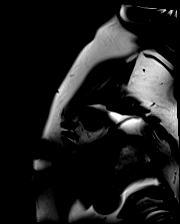

[Series 22: bSSFP · oblique · 8.0mm · 1.70mm/px · 1 of 25 slices shown (15 of 19)]
[im 1/25]
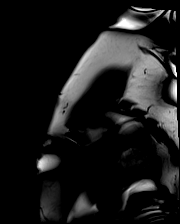

[Series 23: bSSFP · oblique · 8.0mm · 1.70mm/px · 1 of 25 slices shown (16 of 19)]
[im 1/25]
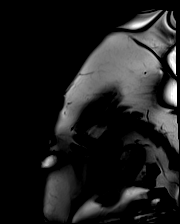

[Series 24: (id)_long_t1 · oblique · 8.0mm · 2.08mm/px · 1 of 24 slices shown]
[im 1/24]
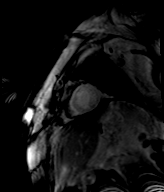

[Series 25: (id)_long_t1_moco · oblique · 8.0mm · 2.08mm/px · 1 of 24 slices shown]
[im 1/24]
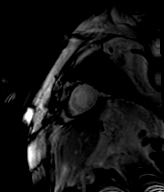

[Series 26: (id)_long_t1_moco_t1 · 1 of 3 slices shown (1 of 2)]
[im 1/3]
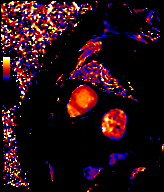

[Series 26: (id)_long_t1_moco_t1 · oblique · 8.0mm · 2.08mm/px · 1 of 3 slices shown (2 of 2)]
[im 1/3]
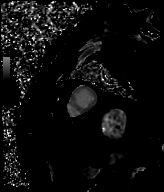

[Series 28: (id)_trufi · oblique · 8.0mm · 2.08mm/px · 1 of 9 slices shown]
[im 1/9]
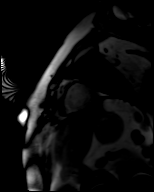

[Series 29: (id)_trufi_moco · oblique · 8.0mm · 2.08mm/px · 1 of 9 slices shown]
[im 1/9]
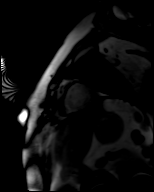

[Series 30: (id)_trufi_moco_t2 · oblique · 8.0mm · 2.08mm/px · 1 of 3 slices shown]
[im 1/3]
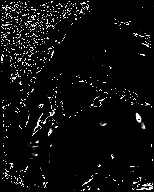

[Series 33: bSSFP · oblique · 6.0mm · 1.41mm/px · 1 of 25 slices shown (17 of 19)]
[im 1/25]
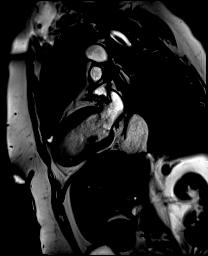

[Series 34: bSSFP · axial · 6.0mm · 1.41mm/px · 1 of 25 slices shown (18 of 19)]
[im 1/25]
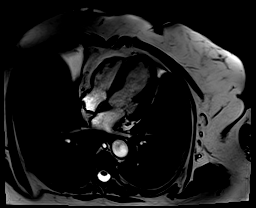

[Series 35: bSSFP · axial · 6.0mm · 1.41mm/px · 1 of 25 slices shown (19 of 19)]
[im 1/25]
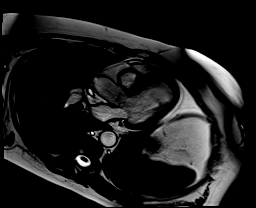

[Series 36: pre short axis · oblique · non-contrast · 8.0mm · 2.38mm/px · 1 of 10 slices shown (1 of 6)]
[im 1/10]
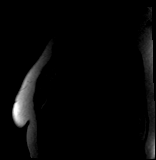

[Series 37: pre short axis · oblique · non-contrast · 8.0mm · 2.38mm/px · 1 of 10 slices shown (2 of 6)]
[im 1/10]
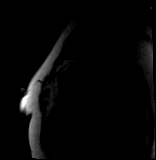

[Series 38: pre short axis · oblique · non-contrast · 8.0mm · 2.38mm/px · 1 of 10 slices shown (3 of 6)]
[im 1/10]
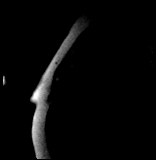

[Series 39: pre short axis · oblique · non-contrast · 8.0mm · 2.38mm/px · 1 of 10 slices shown (4 of 6)]
[im 1/10]
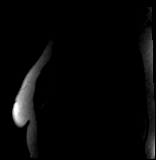

[Series 40: pre short axis · oblique · non-contrast · 8.0mm · 2.38mm/px · 1 of 10 slices shown (5 of 6)]
[im 1/10]
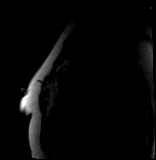

[Series 41: pre short axis · oblique · non-contrast · 8.0mm · 2.38mm/px · 1 of 10 slices shown (6 of 6)]
[im 1/10]
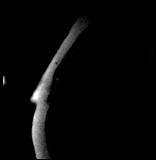

[Series 42: rest short axis · oblique · 8.0mm · 2.38mm/px · 1 of 60 slices shown (1 of 6)]
[im 1/60]
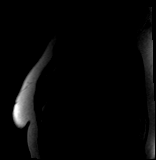

[Series 43: rest short axis · oblique · 8.0mm · 2.38mm/px · 1 of 60 slices shown (2 of 6)]
[im 1/60]
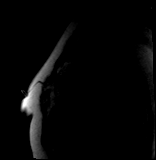

[Series 44: rest short axis · oblique · 8.0mm · 2.38mm/px · 1 of 60 slices shown (3 of 6)]
[im 1/60]
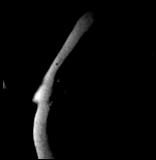

[Series 45: rest short axis · oblique · 8.0mm · 2.38mm/px · 1 of 60 slices shown (4 of 6)]
[im 1/60]
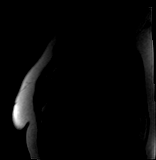

[Series 46: rest short axis · oblique · 8.0mm · 2.38mm/px · 1 of 60 slices shown (5 of 6)]
[im 1/60]
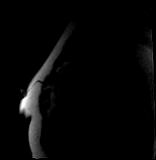

[Series 47: rest short axis · oblique · 8.0mm · 2.38mm/px · 1 of 60 slices shown (6 of 6)]
[im 1/60]
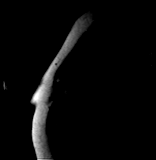

[Series 49: aortic valve cine · oblique · 6.0mm · 1.41mm/px · 1 of 25 slices shown]
[im 1/25]
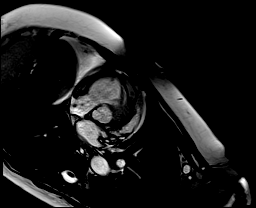

[Series 50: cine rvit · oblique · 6.0mm · 1.41mm/px · 1 of 25 slices shown]
[im 1/25]
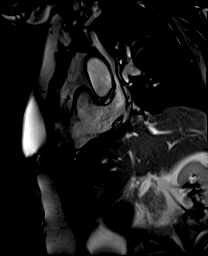

[Series 51: cine rvot · sagittal · 6.0mm · 1.41mm/px · 1 of 25 slices shown]
[im 1/25]
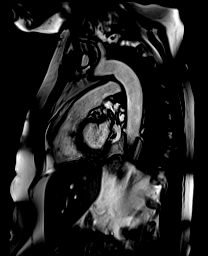

[Series 53: lge_single shot sa · oblique · 8.0mm · 2.08mm/px · 1 of 16 slices shown (1 of 2)]
[im 1/16]
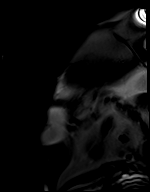

[Series 54: lge_single shot sa · oblique · 8.0mm · 2.08mm/px · 1 of 16 slices shown (2 of 2)]
[im 1/16]
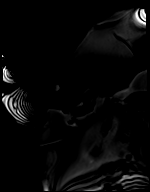

[Series 57: lge_single shot 4 · axial · 6.0mm · 1.98mm/px · 1 of 1 slices shown]
[im 1/1]
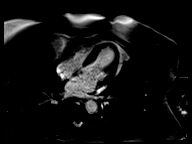

[45 of 48 positions shown; findings below may reference images not displayed]

FINDINGS: Left ventricle:

-Normal size

-Mild systolic dysfunction.  Apical hypokinesis.

-Normal ECV (24%)

-No LGE

LV EF:  50% (Normal 56-78%)

Absolute volumes:

LV EDV: 106mL (Normal 52-141 mL)

LV ESV: 53mL (Normal 13-51 mL)

LV SV: 53mL (Normal 33-97 mL)

CO: 4.9L/min (Normal 2.7-6.0 L/min)

Indexed volumes:

LV EDV: 57mL/sq-m (Normal 41-81 mL/sq-m)

LV ESV: 29mL/sq-m (Normal 12-21 mL/sq-m)

LV SV: 29mL/sq-m (Normal 26-56 mL/sq-m)

CI: 2.7L/min/sq-m (Normal 1.8-3.8 L/min/sq-m)

Right ventricle: Small size with normal systolic function

RV EF: 66% (Normal 47-80%)

Absolute volumes:

RV EDV: 66mL (Normal 58-154 mL)

RV ESV: 22mL (Normal 12-68 mL)

RV SV: 44mL (Normal 35-98 mL)

CO: 4.1L/min (Normal 2.7-6 L/min)

Indexed volumes:

RV EDV: 36mL/sq-m (Normal 48-87 mL/sq-m)

RV ESV: 12mL/sq-m (Normal 11-28 mL/sq-m)

RV SV: 24mL/sq-m (Normal 27-57 mL/sq-m)

CI: 2.2L/min/sq-m (Normal 1.8-3.8 L/min/sq-m)

Left atrium: Normal size

Right atrium: Normal size

Mitral valve: Trivial regurgitation

Aortic valve: Tricuspid.  No regurgitation

Tricuspid valve: Trivial regurgitation

Pulmonic valve: No regurgitation

Aorta: Normal proximal ascending aorta

Pericardium: Normal
IMPRESSION: 1.  No late gadolinium enhancement, suggesting myocardium is viable

2. Normal LV size with mild systolic dysfunction (EF 50%). Apical
hypokinesis

3.  Small RV size with normal systolic function (EF 66%)

## 2021-03-31 MED ORDER — LOSARTAN POTASSIUM 25 MG PO TABS
12.5000 mg | ORAL_TABLET | Freq: Every day | ORAL | Status: DC
  Administered 2021-04-01 – 2021-04-05 (×5): 12.5 mg via ORAL
  Filled 2021-03-31 (×5): qty 1

## 2021-03-31 MED ORDER — GADOBUTROL 1 MMOL/ML IV SOLN
8.0000 mL | Freq: Once | INTRAVENOUS | Status: AC | PRN
  Administered 2021-03-31: 8 mL via INTRAVENOUS

## 2021-03-31 MED ORDER — POTASSIUM CHLORIDE CRYS ER 20 MEQ PO TBCR
40.0000 meq | EXTENDED_RELEASE_TABLET | Freq: Once | ORAL | Status: AC
  Administered 2021-03-31: 40 meq via ORAL
  Filled 2021-03-31: qty 2

## 2021-03-31 NOTE — Progress Notes (Signed)
Chaplain Hurley Blevins attempted Advance Directive Consultation with patient. Patient remain in isolation status. Spoke with patient's nurse who advised that it would be best to return later in the week for consultation. Chaplain will follow up tomorrow. °

## 2021-03-31 NOTE — Progress Notes (Signed)
ANTICOAGULATION CONSULT NOTE  Pharmacy Consult for heparin  Indication: chest pain/ACS  Allergies  Allergen Reactions   Compazine [Prochlorperazine] Anxiety   Dilaudid [Hydromorphone]     Pt does not tolerate well   Haloperidol     homicidal thoughts   Reglan [Metoclopramide] Anxiety   Zolmitriptan Anxiety    Patient Measurements: Height: 5\' 3"  (160 cm) Weight: 76.8 kg (169 lb 5 oz) IBW/kg (Calculated) : 52.4 Heparin Dosing Weight: HEPARIN DW (KG): 69   Vital Signs: Temp: 98.5 F (36.9 C) (01/11 0800) Temp Source: Oral (01/11 0800) BP: 131/77 (01/11 0800) Pulse Rate: 76 (01/11 0800)  Labs: Recent Labs    03/29/21 1938 03/29/21 1938 03/30/21 0432 03/30/21 1043 03/30/21 1840 03/31/21 0137  HGB 15.5*  --  16.1*  --   --  13.7  HCT 45.4  --  47.9*  --   --  41.1  PLT 332  --  343  --   --  256  HEPARINUNFRC  --    < > <0.10* 0.19* 0.35 0.47  CREATININE 1.08*  --  0.92  --   --  1.07*   < > = values in this interval not displayed.     Estimated Creatinine Clearance: 48.7 mL/min (A) (by C-G formula based on SCr of 1.07 mg/dL (H)).   Medical History: Past Medical History:  Diagnosis Date   Asthma    Colitis    Hypertension    Kidney stones    Migraines     Medications:  Medications Prior to Admission  Medication Sig Dispense Refill Last Dose   acetaminophen (TYLENOL) 500 MG tablet Take 1,000 mg by mouth every 6 (six) hours as needed for mild pain, headache or fever.   Past Week   albuterol (PROVENTIL) 2 MG tablet Take 2 mg by mouth 3 (three) times daily as needed for wheezing or shortness of breath.   Past Week   albuterol (VENTOLIN HFA) 108 (90 Base) MCG/ACT inhaler Inhale 2 puffs into the lungs in the morning and at bedtime.   Past Week   amphetamine-dextroamphetamine (ADDERALL) 20 MG tablet Take 20 mg by mouth daily as needed (focus).   Past Week   atorvastatin (LIPITOR) 10 MG tablet Take 10 mg by mouth at bedtime.   unk   budesonide-formoterol  (SYMBICORT) 160-4.5 MCG/ACT inhaler Inhale 2 puffs into the lungs 2 (two) times daily.   Past Week   butalbital-acetaminophen-caffeine (FIORICET) 50-325-40 MG tablet Take 1 tablet by mouth 2 (two) times daily as needed for migraine.   Past Week   calcium carbonate (TUMS - DOSED IN MG ELEMENTAL CALCIUM) 500 MG chewable tablet Chew 500 tablets by mouth daily as needed for indigestion or heartburn.   Past Week   Cholecalciferol (VITAMIN D-3) 25 MCG (1000 UT) CAPS Take 1,000 Units by mouth daily.   Past Week   clonazePAM (KLONOPIN) 0.5 MG tablet Take 0.5 mg by mouth daily as needed for anxiety.   Past Week   diphenhydrAMINE (BENADRYL) 25 mg capsule Take 25 mg by mouth every 6 (six) hours as needed for allergies.   Past Week   DULoxetine (CYMBALTA) 30 MG capsule Take 30 mg by mouth daily. Takes in addition to 60mg  dose for a total daily dose of 90mg    Past Week   DULoxetine (CYMBALTA) 60 MG capsule Take 60 mg by mouth at bedtime. Takes in addition to 30mg  dose for a total daily dose of 90mg    Past Week   fenofibrate (TRICOR) 145 MG  tablet Take 145 mg by mouth daily.   unk   ibuprofen (ADVIL) 200 MG tablet Take 800 mg by mouth every 6 (six) hours as needed for headache or mild pain.   Past Week   levothyroxine (SYNTHROID) 25 MCG tablet Take 25 mcg by mouth See admin instructions. Take 1 and 1/2 every morning   Past Week   loratadine (CLARITIN) 10 MG tablet Take 10 mg by mouth at bedtime.   Past Week   montelukast (SINGULAIR) 10 MG tablet Take 10 mg by mouth at bedtime.   Past Week   ondansetron (ZOFRAN) 4 MG tablet Take 4 mg by mouth every 8 (eight) hours as needed for nausea or vomiting.   Past Week   pantoprazole (PROTONIX) 40 MG tablet Take 80 mg by mouth daily.   Past Week   PAXLOVID, 300/100, 20 x 150 MG & 10 x 100MG  TBPK Take 3 tablets by mouth in the morning and at bedtime. For 5 days   Past Week   promethazine (PHENERGAN) 12.5 MG tablet Take 12.5 mg by mouth every 4 (four) hours as needed for  nausea or vomiting.   Past Week   propranolol (INDERAL) 80 MG tablet Take 80 mg by mouth 2 (two) times daily.   Past Week   Rimegepant Sulfate (NURTEC PO) Take by mouth as needed.   Past Month   tiZANidine (ZANAFLEX) 4 MG tablet Take 2-8 mg by mouth See admin instructions. Take 2 tablets every evening if needed take 1/2 to a whole if headache is bad   Past Week   traZODone (DESYREL) 150 MG tablet Take 150 mg by mouth at bedtime.   Past Week   Scheduled:   aspirin EC  81 mg Oral Daily   atorvastatin  80 mg Oral Daily   Chlorhexidine Gluconate Cloth  6 each Topical Daily   dapagliflozin propanediol  10 mg Oral Daily   DULoxetine  30 mg Oral QHS   DULoxetine  60 mg Oral Daily   ezetimibe  10 mg Oral Daily   fenofibrate  160 mg Oral Daily   fluticasone furoate-vilanterol  1 puff Inhalation Daily   levothyroxine  37.5 mcg Oral Q0600   loratadine  10 mg Oral Daily   mouth rinse  15 mL Mouth Rinse BID   montelukast  10 mg Oral QHS   pantoprazole  40 mg Oral BID   sodium chloride flush  3 mL Intravenous Q12H   spironolactone  12.5 mg Oral Daily   traZODone  150 mg Oral QHS   Infusions:   sodium chloride 250 mL (03/31/21 0408)   heparin 950 Units/hr (03/31/21 0408)   nitroGLYCERIN 25 mcg/min (03/30/21 1500)   PRN: sodium chloride, acetaminophen, albuterol, butalbital-acetaminophen-caffeine, clonazePAM, diphenhydrAMINE, heparin sodium (porcine), influenza vaccine adjuvanted, nitroGLYCERIN, ondansetron (ZOFRAN) IV, ondansetron, sodium chloride flush, tiZANidine Anti-infectives (From admission, onward)    None       Assessment: Meredith Guerra a 71 y.o. female requires anticoagulation with a heparin iv infusion for the indication of  chest pain/ACS. Heparin gtt will be started following pharmacy protocol per pharmacy consult.   Patient transferred to [facility] for cath as a code stemi and found to have multivessel disease and will have surgical consult. No s/sx of bleeding per nurse. Hgb and  PLTs stable.  Heparin level continues to be therapeutic. Plan for CABG once stable with COVID infection. We will continue with the current rate and check daily.  Goal of Therapy:  Heparin level 0.3-0.7 units/ml  Monitor platelets by anticoagulation protocol: Yes   Plan:  Continue heparin infusion at 950 units/hr F/u heparin level in AM Continue to monitor H&H, platelets, s/sx of bleeding daily F/up surgical plan  Erin Hearing PharmD., BCPS Clinical Pharmacist 03/31/2021 9:09 AM

## 2021-03-31 NOTE — Progress Notes (Signed)
°  Progress Note   Date: 03/31/2021  Patient Name: Meredith Guerra        MRN#: [id number]  Clarification of the clinical evidence supporting the diagnosis of STEMI:  EKG showed STEMI However, coronary angiogram did not show occluded vessel to corroborate with this finding. It did, however, show severe distal LM stenosis. Therefore, I called it acute coronary syndrome   Nigel Mormon, MD Pager: [phone] Office: [phone]

## 2021-03-31 NOTE — Progress Notes (Signed)
Chaplain Benetta Spar attempted Advance Directive Consultation with patient. Patient remain in isolation status. Spoke with patient's nurse who advised that it would be best to return later in the week for consultation. Chaplain will follow up tomorrow.

## 2021-03-31 NOTE — Progress Notes (Signed)
Subjective:  Feels well this morning.  No chest pain  Objective:  Vital Signs in the last 24 hours: Temp:  [98.2 F (36.8 C)-98.5 F (36.9 C)] 98.5 F (36.9 C) (01/11 0800) Pulse Rate:  [62-76] 76 (01/11 0800) Resp:  [14-23] 23 (01/11 1200) BP: (90-133)/(58-87) 119/82 (01/11 1200) SpO2:  [93 %-97 %] 93 % (01/11 0800)  Intake/Output from previous day: 01/10 0701 - 01/11 0700 In: 735.8 [P.O.:240; I.V.:495.8] Out: 1000 [Urine:1000]  Physical Exam Vitals and nursing note reviewed.  Constitutional:      General: She is not in acute distress.    Appearance: She is well-developed.  HENT:     Head: Normocephalic and atraumatic.  Eyes:     Conjunctiva/sclera: Conjunctivae normal.     Pupils: Pupils are equal, round, and reactive to light.  Neck:     Vascular: No JVD.  Cardiovascular:     Rate and Rhythm: Normal rate and regular rhythm.     Pulses: Intact distal pulses.          Dorsalis pedis pulses are 0 on the right side and 0 on the left side.       Posterior tibial pulses are 1+ on the right side and 1+ on the left side.     Heart sounds: No murmur heard. Pulmonary:     Effort: Pulmonary effort is normal.     Breath sounds: Normal breath sounds. No wheezing or rales.  Abdominal:     General: Bowel sounds are normal.     Palpations: Abdomen is soft.     Tenderness: There is no rebound.  Musculoskeletal:        General: No tenderness. Normal range of motion.     Left lower leg: No edema.  Lymphadenopathy:     Cervical: No cervical adenopathy.  Skin:    General: Skin is warm and dry.  Neurological:     Mental Status: She is alert and oriented to person, place, and time.     Cranial Nerves: No cranial nerve deficit.     Lab Results: BMP Recent Labs    03/29/21 1938 03/30/21 0432 03/31/21 0137  NA 139 142 139  K 3.6 3.6 3.3*  CL 100 98 100  CO2 27 25 28   GLUCOSE 150* 190* 132*  BUN 15 10 15   CREATININE 1.08* 0.92 1.07*  CALCIUM 9.7 9.6 9.4  GFRNONAA 56*  >60 56*     CBC Recent Labs  Lab 03/31/21 0137  WBC 12.9*  RBC 4.79  HGB 13.7  HCT 41.1  PLT 256  MCV 85.8  MCH 28.6  MCHC 33.3  RDW 14.0      Cardiac Panel (last 3 results)  Latest Reference Range & Units 03/29/21 19:38 03/30/21 04:32  Troponin I (High Sensitivity) <18 ng/L 392 (HH) 1,583 (HH)  (HH): Data is critically high   TSH Recent Labs    01/28/21 1151  TSH 2.84     Lipid Panel     Component Value Date/Time   CHOL 327 (H) 03/30/2021 0432   TRIG 221 (H) 03/30/2021 0432   HDL 51 03/30/2021 0432   CHOLHDL 6.4 03/30/2021 0432   VLDL 44 (H) 03/30/2021 0432   LDLCALC 232 (H) 03/30/2021 0432     Hepatic Function Panel Recent Labs    01/28/21 1151 03/30/21 0432  PROT 7.6 7.8  ALBUMIN  --  4.1  AST 25 36  ALT 26 28  ALKPHOS  --  96  BILITOT 0.5 0.7  Imaging: CXR 03/29/2021: Heart is normal size. Patchy bilateral upper lobe airspace opacities. No effusions. No acute bony abnormality.   IMPRESSION: Patchy bilateral upper lobe opacities, right greater than left.    Cardiac Studies:  Echocardiogram 03/30/2021: left ventricle has moderately decreased function. The left ventricle  demonstrates regional wall motion abnormalities (see scoring  diagram/findings for description). Left ventricular   diastolic parameters are consistent with Grade I diastolic dysfunction  (impaired relaxation). There is akinesis of the left ventricular,  mid-apical anteroseptal wall. There is severe hypokinesis of the left  ventricular, mid-apical inferolateral wall.   2. Right ventricular systolic function is low normal. The right  ventricular size is normal.   3. The mitral valve is normal in structure. No evidence of mitral valve  regurgitation. No evidence of mitral stenosis.   4. The aortic valve is tricuspid. Aortic valve regurgitation is not  visualized.   EKG 03/29/2021: Sinus rhythm.  Acute anterolateral injury  CXR 03/29/2021: Patchy bilateral upper  lobe opacities, right greater than left.  Assessment & Recommendations:  71 y.o. Caucasian female  with hypertension, mixed hyperlipidemia, asthma, remote history of Takotsubo cardiomyopathy, recent diagnosis of COVID, completed 5-day therapy with PACs elevated on 03/29/2021, admitted with acute coronary syndrome  Acute coronary syndrome: Initially presented with ST elevation on EKG.  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 Hemodynamically stable. Continue aspirin, heparin, nitroglycerin. Appreciate CVTS consult, awaiting CABG next Tuesday 1/17.  Cardiomyopathy: Global LVEF 30-35% with preserved basal wall motion, and akinesis to severe hypokinesis of entire mid to apical myocardium. Wall motion abnormalities quite similar to that seen in talks of the cardiomyopathy.  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  Patient does have remote history of Takotsubo cardiomyopathy in the past. Will obtain cardiac MRI to assess viability while awaiting CABG. Continue spironolactone 12.5 mg daily, Farxiga 10 mg daily.  Added losartan 12.5 mg daily, given low normal blood pressures consider.  If tolerates well, could change to [facility]. Will add beta-blocker after bypass surgery.  Mixed hyperlipidemia: LDL 232.  At baseline, patient was on Tricor 145 mg, Lipitor 10 mg daily. Recommend Lipitor 80 mg daily, Zetia 10 mg daily. She will need Repatha outpatient.  COVID-pneumonia: Bilateral hazy opacities on chest x-ray 03/29/2021. Stable from respiratory standpoint.  Has completed 5 days of Macrobid. Continue baseline asthma treatment.    Elder Negus, MD Pager: [phone] Office: [phone]

## 2021-04-01 ENCOUNTER — Inpatient Hospital Stay (HOSPITAL_COMMUNITY): Payer: Medicare Other

## 2021-04-01 ENCOUNTER — Encounter (HOSPITAL_COMMUNITY): Payer: Self-pay | Admitting: Cardiology

## 2021-04-01 ENCOUNTER — Other Ambulatory Visit (HOSPITAL_COMMUNITY): Payer: Medicare Other

## 2021-04-01 DIAGNOSIS — U071 COVID-19: Secondary | ICD-10-CM

## 2021-04-01 DIAGNOSIS — I213 ST elevation (STEMI) myocardial infarction of unspecified site: Secondary | ICD-10-CM

## 2021-04-01 DIAGNOSIS — J1282 Pneumonia due to coronavirus disease 2019: Secondary | ICD-10-CM

## 2021-04-01 LAB — CBC
HCT: 42.4 % (ref 36.0–46.0)
Hemoglobin: 13.8 g/dL (ref 12.0–15.0)
MCH: 28.2 pg (ref 26.0–34.0)
MCHC: 32.5 g/dL (ref 30.0–36.0)
MCV: 86.7 fL (ref 80.0–100.0)
Platelets: 298 10*3/uL (ref 150–400)
RBC: 4.89 MIL/uL (ref 3.87–5.11)
RDW: 13.9 % (ref 11.5–15.5)
WBC: 12.9 10*3/uL — ABNORMAL HIGH (ref 4.0–10.5)
nRBC: 0 % (ref 0.0–0.2)

## 2021-04-01 LAB — BASIC METABOLIC PANEL
Anion gap: 12 (ref 5–15)
BUN: 6 mg/dL — ABNORMAL LOW (ref 8–23)
CO2: 28 mmol/L (ref 22–32)
Calcium: 9.6 mg/dL (ref 8.9–10.3)
Chloride: 102 mmol/L (ref 98–111)
Creatinine, Ser: 0.88 mg/dL (ref 0.44–1.00)
GFR, Estimated: >60 mL/min (ref >60)
Glucose, Bld: 126 mg/dL — ABNORMAL HIGH (ref 70–99)
Potassium: 3.8 mmol/L (ref 3.5–5.1)
Sodium: 142 mmol/L (ref 135–145)

## 2021-04-01 LAB — HEPARIN LEVEL (UNFRACTIONATED): Heparin Unfractionated: 0.36 IU/mL (ref 0.30–0.70)

## 2021-04-01 LAB — SURGICAL PCR SCREEN
MRSA, PCR: NEGATIVE
Staphylococcus aureus: NEGATIVE

## 2021-04-01 IMAGING — DX DG CHEST 1V PORT
1 series · 1 of 1 positions shown · non-contrast
Comparison: Radiograph [DATE]

CLINICAL DATA: Shortness of breath

EXAM:
PORTABLE CHEST 1 VIEW

[chest ap]
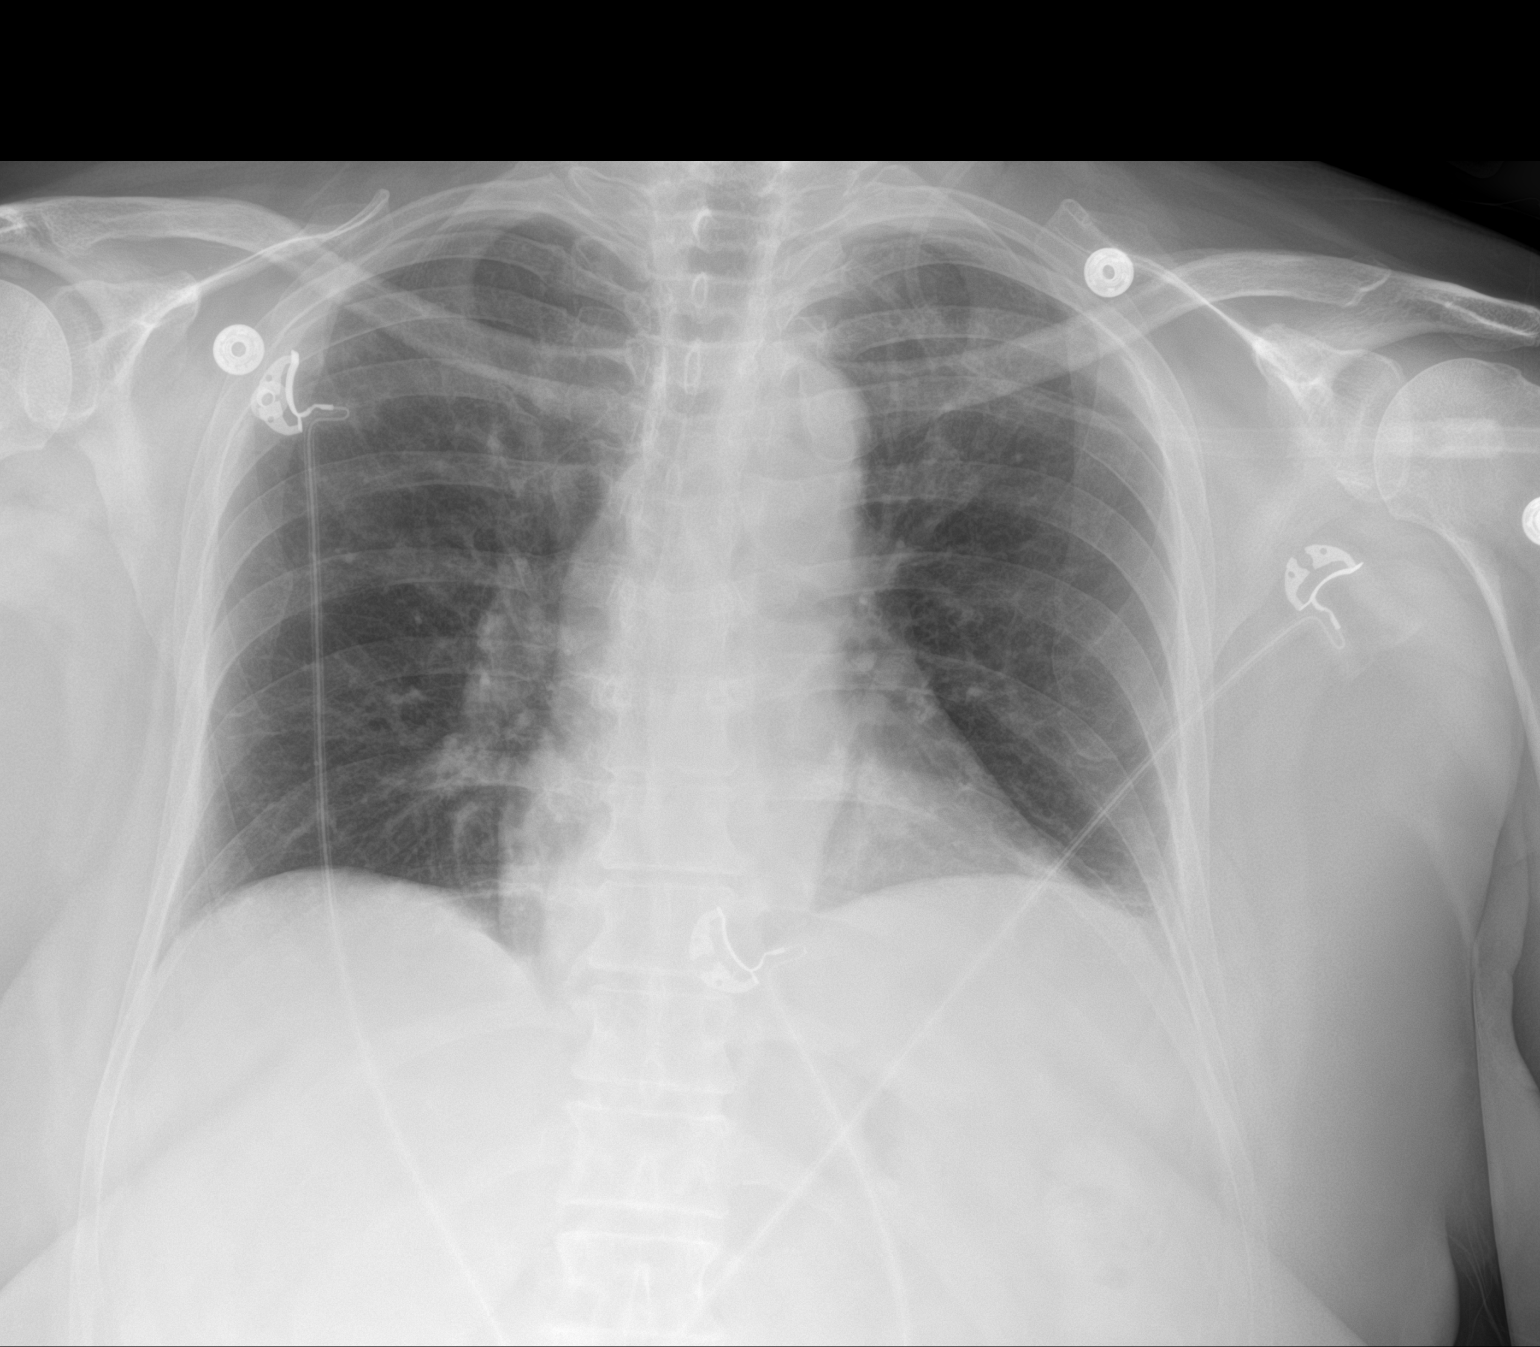

[1 of 1 positions shown; findings below may reference images not displayed]

FINDINGS: Unchanged cardiomediastinal silhouette. There is bibasilar
subsegmental atelectasis. Improvement in upper lobe airspace disease
with mild residual patchy opacities. There is no large pleural
effusion. No visible pneumothorax. There is no acute osseous
abnormality.
IMPRESSION: Improvement in upper lobe airspace disease with mild residual
opacity opacities.

Bibasilar subsegmental atelectasis.

## 2021-04-01 MED ORDER — METOPROLOL TARTRATE 25 MG PO TABS
25.0000 mg | ORAL_TABLET | Freq: Two times a day (BID) | ORAL | Status: DC
  Administered 2021-04-01 – 2021-04-04 (×9): 25 mg via ORAL
  Filled 2021-04-01 (×9): qty 1

## 2021-04-01 MED ORDER — METOPROLOL TARTRATE 5 MG/5ML IV SOLN
2.5000 mg | Freq: Once | INTRAVENOUS | Status: AC
  Administered 2021-04-01: 2.5 mg via INTRAVENOUS
  Filled 2021-04-01: qty 5

## 2021-04-01 NOTE — Progress Notes (Signed)
ANTICOAGULATION CONSULT NOTE  Pharmacy Consult for heparin  Indication: chest pain/ACS  Allergies  Allergen Reactions   Compazine [Prochlorperazine] Anxiety   Dilaudid [Hydromorphone]     Pt does not tolerate well   Haloperidol     homicidal thoughts   Reglan [Metoclopramide] Anxiety   Zolmitriptan Anxiety    Patient Measurements: Height: 5\' 3"  (160 cm) Weight: 76.8 kg (169 lb 5 oz) IBW/kg (Calculated) : 52.4 Heparin Dosing Weight: HEPARIN DW (KG): 69   Vital Signs: Temp: 98.4 F (36.9 C) (01/12 1130) Temp Source: Oral (01/12 1130) BP: 122/69 (01/12 1130) Pulse Rate: 75 (01/12 1130)  Labs: Recent Labs    03/30/21 0432 03/30/21 1043 03/30/21 1840 03/31/21 0137 04/01/21 0534  HGB 16.1*  --   --  13.7 13.8  HCT 47.9*  --   --  41.1 42.4  PLT 343  --   --  256 298  HEPARINUNFRC <0.10*   < > 0.35 0.47 0.36  CREATININE 0.92  --   --  1.07* 0.88   < > = values in this interval not displayed.     Estimated Creatinine Clearance: 59.2 mL/min (by C-G formula based on SCr of 0.88 mg/dL).   Medical History: Past Medical History:  Diagnosis Date   Asthma    Colitis    Hypertension    Kidney stones    Migraines     Medications:  Medications Prior to Admission  Medication Sig Dispense Refill Last Dose   acetaminophen (TYLENOL) 500 MG tablet Take 1,000 mg by mouth every 6 (six) hours as needed for mild pain, headache or fever.   Past Week   albuterol (PROVENTIL) 2 MG tablet Take 2 mg by mouth 3 (three) times daily as needed for wheezing or shortness of breath.   Past Week   albuterol (VENTOLIN HFA) 108 (90 Base) MCG/ACT inhaler Inhale 2 puffs into the lungs in the morning and at bedtime.   Past Week   amphetamine-dextroamphetamine (ADDERALL) 20 MG tablet Take 20 mg by mouth daily as needed (focus).   Past Week   atorvastatin (LIPITOR) 10 MG tablet Take 10 mg by mouth at bedtime.   unk   budesonide-formoterol (SYMBICORT) 160-4.5 MCG/ACT inhaler Inhale 2 puffs into  the lungs 2 (two) times daily.   Past Week   butalbital-acetaminophen-caffeine (FIORICET) 50-325-40 MG tablet Take 1 tablet by mouth 2 (two) times daily as needed for migraine.   Past Week   calcium carbonate (TUMS - DOSED IN MG ELEMENTAL CALCIUM) 500 MG chewable tablet Chew 500 tablets by mouth daily as needed for indigestion or heartburn.   Past Week   Cholecalciferol (VITAMIN D-3) 25 MCG (1000 UT) CAPS Take 1,000 Units by mouth daily.   Past Week   clonazePAM (KLONOPIN) 0.5 MG tablet Take 0.5 mg by mouth daily as needed for anxiety.   Past Week   diphenhydrAMINE (BENADRYL) 25 mg capsule Take 25 mg by mouth every 6 (six) hours as needed for allergies.   Past Week   DULoxetine (CYMBALTA) 30 MG capsule Take 30 mg by mouth daily. Takes in addition to 60mg  dose for a total daily dose of 90mg    Past Week   DULoxetine (CYMBALTA) 60 MG capsule Take 60 mg by mouth at bedtime. Takes in addition to 30mg  dose for a total daily dose of 90mg    Past Week   fenofibrate (TRICOR) 145 MG tablet Take 145 mg by mouth daily.   unk   ibuprofen (ADVIL) 200 MG tablet Take 800  mg by mouth every 6 (six) hours as needed for headache or mild pain.   Past Week   levothyroxine (SYNTHROID) 25 MCG tablet Take 25 mcg by mouth See admin instructions. Take 1 and 1/2 every morning   Past Week   loratadine (CLARITIN) 10 MG tablet Take 10 mg by mouth at bedtime.   Past Week   montelukast (SINGULAIR) 10 MG tablet Take 10 mg by mouth at bedtime.   Past Week   ondansetron (ZOFRAN) 4 MG tablet Take 4 mg by mouth every 8 (eight) hours as needed for nausea or vomiting.   Past Week   pantoprazole (PROTONIX) 40 MG tablet Take 80 mg by mouth daily.   Past Week   PAXLOVID, 300/100, 20 x 150 MG & 10 x 100MG  TBPK Take 3 tablets by mouth in the morning and at bedtime. For 5 days   Past Week   promethazine (PHENERGAN) 12.5 MG tablet Take 12.5 mg by mouth every 4 (four) hours as needed for nausea or vomiting.   Past Week   propranolol (INDERAL) 80  MG tablet Take 80 mg by mouth 2 (two) times daily.   Past Week   Rimegepant Sulfate (NURTEC PO) Take by mouth as needed.   Past Month   tiZANidine (ZANAFLEX) 4 MG tablet Take 2-8 mg by mouth See admin instructions. Take 2 tablets every evening if needed take 1/2 to a whole if headache is bad   Past Week   traZODone (DESYREL) 150 MG tablet Take 150 mg by mouth at bedtime.   Past Week   Scheduled:   aspirin EC  81 mg Oral Daily   atorvastatin  80 mg Oral Daily   dapagliflozin propanediol  10 mg Oral Daily   DULoxetine  30 mg Oral QHS   DULoxetine  60 mg Oral Daily   ezetimibe  10 mg Oral Daily   fenofibrate  160 mg Oral Daily   fluticasone furoate-vilanterol  1 puff Inhalation Daily   levothyroxine  37.5 mcg Oral Q0600   loratadine  10 mg Oral Daily   losartan  12.5 mg Oral Daily   mouth rinse  15 mL Mouth Rinse BID   metoprolol tartrate  25 mg Oral BID   montelukast  10 mg Oral QHS   pantoprazole  40 mg Oral BID   sodium chloride flush  3 mL Intravenous Q12H   spironolactone  12.5 mg Oral Daily   traZODone  150 mg Oral QHS   Infusions:   sodium chloride Stopped (04/01/21 0001)   heparin 950 Units/hr (04/01/21 0549)   nitroGLYCERIN 35 mcg/min (04/01/21 0300)   PRN: sodium chloride, acetaminophen, albuterol, butalbital-acetaminophen-caffeine, clonazePAM, diphenhydrAMINE, heparin sodium (porcine), influenza vaccine adjuvanted, nitroGLYCERIN, ondansetron (ZOFRAN) IV, ondansetron, sodium chloride flush, tiZANidine Anti-infectives (From admission, onward)    None       Assessment: Meredith Guerra a 71 y.o. female requires anticoagulation with a heparin iv infusion for the indication of  chest pain/ACS. Heparin gtt will be started following pharmacy protocol per pharmacy consult.   Patient transferred to [facility] for cath as a code stemi and found to have multivessel disease and will have surgical consult. No s/sx of bleeding per nurse. Hgb and PLTs stable.  Heparin level continues to be  therapeutic. Plan for CABG once stable with COVID infection clears. We will continue with the current rate and check daily.  Goal of Therapy:  Heparin level 0.3-0.7 units/ml Monitor platelets by anticoagulation protocol: Yes   Plan:  Continue heparin  infusion at 950 units/hr Continue to monitor H&H, platelets, s/sx of bleeding daily F/up surgical plan  Erin Hearing PharmD., BCPS Clinical Pharmacist 04/01/2021 12:16 PM

## 2021-04-01 NOTE — Progress Notes (Signed)
3 Days Post-Op Procedure(s) (LRB): LEFT HEART CATH AND CORONARY ANGIOGRAPHY (N/A) Subjective: She developed chest pain during cardiac MRI but says her iv NTG was briefly turned off Results of MRI noted- improved LV fx since echocardiogram with good myocardial viability Troponin level trending down PCXR pending to eval lungs  Objective: Vital signs in last 24 hours: Temp:  [98.2 F (36.8 C)-99.1 F (37.3 C)] 98.3 F (36.8 C) (01/12 0730) Pulse Rate:  [76-128] 76 (01/12 0730) Cardiac Rhythm: Normal sinus rhythm (01/12 0701) Resp:  [16-27] 19 (01/12 0730) BP: (103-165)/(58-94) 159/94 (01/12 0730) SpO2:  [90 %-98 %] 95 % (01/12 0730)  Hemodynamic parameters for last 24 hours:  nsr  Intake/Output from previous day: 01/11 0701 - 01/12 0700 In: 988.2 [P.O.:480; I.V.:508.2] Out: 1650 [Urine:1650] Intake/Output this shift: Total I/O In: 240 [P.O.:240] Out: -        Exam    General- alert and comfortable    Neck- no JVD, no cervical adenopathy palpable, no carotid bruit   Lungs- scattered  dry rales   Cor- regular rate and rhythm, no murmur , gallop   Abdomen- soft, non-tender   Extremities - warm, non-tender, minimal edema   Neuro- oriented, appropriate, no focal weakness   Lab Results: Recent Labs    03/31/21 0137 04/01/21 0534  WBC 12.9* 12.9*  HGB 13.7 13.8  HCT 41.1 42.4  PLT 256 298   BMET:  Recent Labs    03/31/21 0137 04/01/21 0534  NA 139 142  K 3.3* 3.8  CL 100 102  CO2 28 28  GLUCOSE 132* 126*  BUN 15 6*  CREATININE 1.07* 0.88  CALCIUM 9.4 9.6    PT/INR: No results for input(s): LABPROT, INR in the last 72 hours. ABG No results found for: PHART, HCO3, TCO2, ACIDBASEDEF, O2SAT CBG (last 3)  No results for input(s): GLUCAP in the last 72 hours.  Assessment/Plan: S/P Procedure(s) (LRB): LEFT HEART CATH AND CORONARY ANGIOGRAPHY (N/A) Plan multivessel CABG in 5 days 1-17 to allow lungs to recover from covid pneumonitis. Plan of care d/w  patient CXR pending today Pre-cabg dopplers pending  LOS: 3 days    Lovett Sox 04/01/2021

## 2021-04-01 NOTE — Progress Notes (Signed)
Subjective:   Episode of chest pain after returning from MRI and weaning nitro down, also corresponding to sinus tachycardia in 120s  Pain, HR improved with IV metoprolol, followed by PO metoprolol and increasing nitro to 35 mics  Currently chest pain free  Objective:  Vital Signs in the last 24 hours: Temp:  [98.2 F (36.8 C)-99.1 F (37.3 C)] 98.2 F (36.8 C) (01/12 0400) Pulse Rate:  [76-128] 91 (01/12 0400) Resp:  [15-27] 21 (01/12 0400) BP: (103-165)/(58-92) 135/77 (01/12 0400) SpO2:  [90 %-98 %] 92 % (01/12 0400)  Intake/Output from previous day: 01/11 0701 - 01/12 0700 In: 988.2 [P.O.:480; I.V.:508.2] Out: 1000 [Urine:1000]  Physical Exam Vitals and nursing note reviewed.  Constitutional:      General: She is not in acute distress.    Appearance: She is well-developed.  HENT:     Head: Normocephalic and atraumatic.  Eyes:     Conjunctiva/sclera: Conjunctivae normal.     Pupils: Pupils are equal, round, and reactive to light.  Neck:     Vascular: No JVD.  Cardiovascular:     Rate and Rhythm: Normal rate and regular rhythm.     Pulses: Intact distal pulses.          Dorsalis pedis pulses are 0 on the right side and 0 on the left side.       Posterior tibial pulses are 1+ on the right side and 1+ on the left side.     Heart sounds: No murmur heard. Pulmonary:     Effort: Pulmonary effort is normal.     Breath sounds: Normal breath sounds. No wheezing or rales.  Abdominal:     General: Bowel sounds are normal.     Palpations: Abdomen is soft.     Tenderness: There is no rebound.  Musculoskeletal:        General: No tenderness. Normal range of motion.     Left lower leg: No edema.  Lymphadenopathy:     Cervical: No cervical adenopathy.  Skin:    General: Skin is warm and dry.  Neurological:     Mental Status: She is alert and oriented to person, place, and time.     Cranial Nerves: No cranial nerve deficit.     Lab Results: BMP Recent Labs     03/29/21 1938 03/30/21 0432 03/31/21 0137  NA 139 142 139  K 3.6 3.6 3.3*  CL 100 98 100  CO2 27 25 28   GLUCOSE 150* 190* 132*  BUN 15 10 15   CREATININE 1.08* 0.92 1.07*  CALCIUM 9.7 9.6 9.4  GFRNONAA 56* >60 56*     CBC Recent Labs  Lab 03/31/21 0137  WBC 12.9*  RBC 4.79  HGB 13.7  HCT 41.1  PLT 256  MCV 85.8  MCH 28.6  MCHC 33.3  RDW 14.0      Cardiac Panel (last 3 results)  Latest Reference Range & Units 03/29/21 19:38 03/30/21 04:32  Troponin I (High Sensitivity) <18 ng/L 392 (HH) 1,583 (HH)  (HH): Data is critically high   TSH Recent Labs    01/28/21 1151  TSH 2.84     Lipid Panel     Component Value Date/Time   CHOL 327 (H) 03/30/2021 0432   TRIG 221 (H) 03/30/2021 0432   HDL 51 03/30/2021 0432   CHOLHDL 6.4 03/30/2021 0432   VLDL 44 (H) 03/30/2021 0432   LDLCALC 232 (H) 03/30/2021 0432     Hepatic Function Panel Recent Labs  01/28/21 1151 03/30/21 0432  PROT 7.6 7.8  ALBUMIN  --  4.1  AST 25 36  ALT 26 28  ALKPHOS  --  96  BILITOT 0.5 0.7     Imaging: CXR 03/29/2021: Heart is normal size. Patchy bilateral upper lobe airspace opacities. No effusions. No acute bony abnormality.   IMPRESSION: Patchy bilateral upper lobe opacities, right greater than left.    Cardiac Studies:  EKG 04/01/2020: Evolving changes of anteroseptal infarct No new ST elevations  Cardiac MRI 03/31/2021: 1.  No late gadolinium enhancement, suggesting myocardium is viable 2. Normal LV size with mild systolic dysfunction (EF 50%). Apical hypokinesis 3.  Small RV size with normal systolic function (EF 66%)    Echocardiogram 03/30/2021: left ventricle has moderately decreased function. The left ventricle  demonstrates regional wall motion abnormalities (see scoring  diagram/findings for description). Left ventricular   diastolic parameters are consistent with Grade I diastolic dysfunction  (impaired relaxation). There is akinesis of the left  ventricular,  mid-apical anteroseptal wall. There is severe hypokinesis of the left  ventricular, mid-apical inferolateral wall.   2. Right ventricular systolic function is low normal. The right  ventricular size is normal.   3. The mitral valve is normal in structure. No evidence of mitral valve  regurgitation. No evidence of mitral stenosis.   4. The aortic valve is tricuspid. Aortic valve regurgitation is not  visualized.   EKG 03/29/2021: Sinus rhythm.  Acute anterolateral injury  CXR 03/29/2021: Patchy bilateral upper lobe opacities, right greater than left.  Assessment & Recommendations:  71 y.o. Caucasian female  with hypertension, mixed hyperlipidemia, asthma, remote history of Takotsubo cardiomyopathy, recent diagnosis of COVID, completed 5-day therapy with PACs elevated on 03/29/2021, admitted with acute coronary syndrome  Acute coronary syndrome: Initially presented with ST elevation on EKG.  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 Hemodynamically stable. Chest pain on 1/12 morning. Nitro up to 35 mics. Now chest pain free. MRI showed EF 50%. Okay to add metoprolol PO 25 mg bid now.  Continue aspirin, heparin, Appreciate CVTS consult, awaiting CABG next Tuesday 1/17.  Cardiomyopathy: Global LVEF 30-35% on echo 1/10 with preserved basal wall motion, and akinesis to severe hypokinesis of entire mid to apical myocardium. However, MRI 1/11 showed only apical hypokinesis, EF 50%. No scar. Wall motion abnormalities quite similar to that seen in talks of the cardiomyopathy.  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  Patient does have remote history of Takotsubo cardiomyopathy in the past. Will obtain cardiac MRI to assess viability while awaiting CABG. Continue spironolactone 12.5 mg daily, Farxiga 10 mg daily,  losartan 12.5 mg daily. Added metoprolol tartrate 25 mg bid.  Mixed hyperlipidemia: LDL 232.  At baseline, patient was on Tricor 145 mg, Lipitor 10 mg daily. Recommend Lipitor 80 mg daily, Zetia 10 mg daily. She will need Repatha outpatient.  COVID-pneumonia: Bilateral hazy opacities on chest x-ray 03/29/2021. Stable from respiratory standpoint.  Has completed 5 days of Macrobid. Continue baseline asthma treatment.    Elder Negus, MD Pager: [phone] Office: [phone]

## 2021-04-01 NOTE — Progress Notes (Signed)
2200 Patient was assisted OOB to chair for wash up/bed bath. HR increased from mid 90s/ low 100s to 150s with activity. Upon return to bed, HR remained elevated to mid 120s- sinus tachycardia. Pt began experiencing chest pain and nitro gtt was resumed.  0000 Patient called c/o increasing CP 4/10. Nitro gtt titrated for chest pain resolution. HR remains mid 120s. MD Gangi paged.  0200 Patient called c/o 10/10 crushing chest pain. States this is "almost as bad as when I came in." HR now mid 130s. Nitro gtt titrated. EKG obtained.   [id number] MD Jacinto Halim paged.  0240 MD Gangi called. New orders for lopressor obtained and orders implemented. Patient HR mid 110s with 2/10 chest pain.

## 2021-04-02 ENCOUNTER — Other Ambulatory Visit (HOSPITAL_COMMUNITY): Payer: Medicare Other

## 2021-04-02 ENCOUNTER — Inpatient Hospital Stay (HOSPITAL_COMMUNITY): Payer: Medicare Other

## 2021-04-02 DIAGNOSIS — Z01810 Encounter for preprocedural cardiovascular examination: Secondary | ICD-10-CM

## 2021-04-02 DIAGNOSIS — I2510 Atherosclerotic heart disease of native coronary artery without angina pectoris: Secondary | ICD-10-CM | POA: Diagnosis not present

## 2021-04-02 LAB — CBC
HCT: 41.1 % (ref 36.0–46.0)
Hemoglobin: 13.6 g/dL (ref 12.0–15.0)
MCH: 28.5 pg (ref 26.0–34.0)
MCHC: 33.1 g/dL (ref 30.0–36.0)
MCV: 86 fL (ref 80.0–100.0)
Platelets: 328 10*3/uL (ref 150–400)
RBC: 4.78 MIL/uL (ref 3.87–5.11)
RDW: 13.9 % (ref 11.5–15.5)
WBC: 12.5 10*3/uL — ABNORMAL HIGH (ref 4.0–10.5)
nRBC: 0 % (ref 0.0–0.2)

## 2021-04-02 LAB — BASIC METABOLIC PANEL
Anion gap: 10 (ref 5–15)
BUN: 9 mg/dL (ref 8–23)
CO2: 28 mmol/L (ref 22–32)
Calcium: 9.4 mg/dL (ref 8.9–10.3)
Chloride: 101 mmol/L (ref 98–111)
Creatinine, Ser: 0.89 mg/dL (ref 0.44–1.00)
GFR, Estimated: >60 mL/min (ref >60)
Glucose, Bld: 147 mg/dL — ABNORMAL HIGH (ref 70–99)
Potassium: 3.9 mmol/L (ref 3.5–5.1)
Sodium: 139 mmol/L (ref 135–145)

## 2021-04-02 LAB — HEPARIN LEVEL (UNFRACTIONATED): Heparin Unfractionated: 0.33 IU/mL (ref 0.30–0.70)

## 2021-04-02 MED ORDER — POLYETHYLENE GLYCOL 3350 17 G PO PACK
17.0000 g | PACK | Freq: Once | ORAL | Status: AC
  Administered 2021-04-02: 17 g via ORAL
  Filled 2021-04-02: qty 1

## 2021-04-02 MED ORDER — POLYETHYLENE GLYCOL 3350 17 G PO PACK: 17.0000 g | PACK | Freq: Once | ORAL | Status: DC

## 2021-04-02 NOTE — TOC Progression Note (Signed)
Transition of Care [facility]) - Progression Note    Patient Details  Name: Meredith Guerra MRN: [id number] Date of Birth: 05/11/1950  Transition of Care [facility]) CM/SW Contact  Beckie Busing, RN Phone [phone]  04/02/2021, 1:15 PM  Clinical Narrative:     Transition of Care (TOC) Screening Note   Patient Details  Name: Meredith Guerra Date of Birth: [birth year]   Transition of Care [facility]) CM/SW Contact:    Beckie Busing, RN Phone Number: 04/02/2021, 1:15 PM    Transition of Care Department [facility]) has reviewed patient and no TOC needs have been identified at this time. We will continue to monitor patient advancement through interdisciplinary progression rounds. If new patient transition needs arise, please place a TOC consult.          Expected Discharge Plan and Services                                                 Social Determinants of Health (SDOH) Interventions    Readmission Risk Interventions No flowsheet data found.

## 2021-04-02 NOTE — Progress Notes (Signed)
Subjective:  No more chest pain  Objective:  Vital Signs in the last 24 hours: Temp:  [98.1 F (36.7 C)-99.1 F (37.3 C)] 98.6 F (37 C) (01/13 1002) Pulse Rate:  [77-103] 77 (01/13 1002) Resp:  [14-20] 14 (01/13 1002) BP: (102-151)/(59-91) 102/59 (01/13 1002) SpO2:  [83 %-95 %] 94 % (01/13 1002)  Intake/Output from previous day: 01/12 0701 - 01/13 0700 In: 946.1 [P.O.:480; I.V.:466.1] Out: 1400 [Urine:1400]  Physical Exam Vitals and nursing note reviewed.  Constitutional:      General: She is not in acute distress.    Appearance: She is well-developed.  HENT:     Head: Normocephalic and atraumatic.  Eyes:     Conjunctiva/sclera: Conjunctivae normal.     Pupils: Pupils are equal, round, and reactive to light.  Neck:     Vascular: No JVD.  Cardiovascular:     Rate and Rhythm: Normal rate and regular rhythm.     Pulses: Intact distal pulses.          Dorsalis pedis pulses are 0 on the right side and 0 on the left side.       Posterior tibial pulses are 1+ on the right side and 1+ on the left side.     Heart sounds: No murmur heard. Pulmonary:     Effort: Pulmonary effort is normal.     Breath sounds: Normal breath sounds. No wheezing or rales.  Abdominal:     General: Bowel sounds are normal.     Palpations: Abdomen is soft.     Tenderness: There is no rebound.  Musculoskeletal:        General: No tenderness. Normal range of motion.     Right lower leg: No edema.     Left lower leg: No edema.  Lymphadenopathy:     Cervical: No cervical adenopathy.  Skin:    General: Skin is warm and dry.  Neurological:     Mental Status: She is alert and oriented to person, place, and time.     Cranial Nerves: No cranial nerve deficit.     Lab Results: BMP Recent Labs    03/31/21 0137 04/01/21 0534 04/02/21 0122  NA 139 142 139  K 3.3* 3.8 3.9  CL 100 102 101  CO2 28 28 28   GLUCOSE 132* 126* 147*  BUN 15 6* 9  CREATININE 1.07* 0.88 0.89  CALCIUM 9.4 9.6 9.4   GFRNONAA 56* >60 >60     CBC Recent Labs  Lab 04/02/21 0122  WBC 12.5*  RBC 4.78  HGB 13.6  HCT 41.1  PLT 328  MCV 86.0  MCH 28.5  MCHC 33.1  RDW 13.9      Cardiac Panel (last 3 results)  Latest Reference Range & Units 03/29/21 19:38 03/30/21 04:32  Troponin I (High Sensitivity) <18 ng/L 392 (HH) 1,583 (HH)  (HH): Data is critically high   TSH Recent Labs    01/28/21 1151  TSH 2.84     Lipid Panel     Component Value Date/Time   CHOL 327 (H) 03/30/2021 0432   TRIG 221 (H) 03/30/2021 0432   HDL 51 03/30/2021 0432   CHOLHDL 6.4 03/30/2021 0432   VLDL 44 (H) 03/30/2021 0432   LDLCALC 232 (H) 03/30/2021 0432     Hepatic Function Panel Recent Labs    01/28/21 1151 03/30/21 0432  PROT 7.6 7.8  ALBUMIN  --  4.1  AST 25 36  ALT 26 28  ALKPHOS  --  96  BILITOT 0.5 0.7     Imaging: CXR 03/29/2021: Heart is normal size. Patchy bilateral upper lobe airspace opacities. No effusions. No acute bony abnormality.   IMPRESSION: Patchy bilateral upper lobe opacities, right greater than left.    Cardiac Studies:  EKG 04/01/2020: Evolving changes of anteroseptal infarct No new ST elevations  Cardiac MRI 03/31/2021: 1.  No late gadolinium enhancement, suggesting myocardium is viable 2. Normal LV size with mild systolic dysfunction (EF 50%). Apical hypokinesis 3.  Small RV size with normal systolic function (EF 66%)    Echocardiogram 03/30/2021: left ventricle has moderately decreased function. The left ventricle  demonstrates regional wall motion abnormalities (see scoring  diagram/findings for description). Left ventricular   diastolic parameters are consistent with Grade I diastolic dysfunction  (impaired relaxation). There is akinesis of the left ventricular,  mid-apical anteroseptal wall. There is severe hypokinesis of the left  ventricular, mid-apical inferolateral wall.   2. Right ventricular systolic function is low normal. The right   ventricular size is normal.   3. The mitral valve is normal in structure. No evidence of mitral valve  regurgitation. No evidence of mitral stenosis.   4. The aortic valve is tricuspid. Aortic valve regurgitation is not  visualized.   EKG 03/29/2021: Sinus rhythm.  Acute anterolateral injury  CXR 03/29/2021: Patchy bilateral upper lobe opacities, right greater than left.  Assessment & Recommendations:  71 y.o. Caucasian female  with hypertension, mixed hyperlipidemia, asthma, remote history of Takotsubo cardiomyopathy, recent diagnosis of COVID, completed 5-day therapy with PACs elevated on 03/29/2021, admitted with acute coronary syndrome  Acute coronary syndrome: Initially presented with ST elevation on EKG.  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 Hemodynamically stable. Chest pain free on IV nitro Continue aspirin, heparin, metoprolol tartrate 25 mg bid, losartan 12.5 mg, spironolactone 12.5, Farxiga 10 Appreciate CVTS consult, awaiting CABG next Tuesday 1/17.  Cardiomyopathy: Global LVEF 30-35% on echo 1/10 with preserved basal wall motion, and akinesis to severe hypokinesis of entire mid to apical myocardium. However, MRI 1/11 showed only apical hypokinesis, EF 50%. No scar. Wall motion abnormalities quite similar to that seen in talks of the cardiomyopathy.  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  Patient does have remote history of Takotsubo cardiomyopathy in the past. Will obtain cardiac MRI to assess viability while awaiting CABG. Continue metoprolol tartrate 25 mg bid, losartan 12.5 mg, spironolactone 12.5, Farxiga 10,  Mixed hyperlipidemia: LDL 232.  At baseline, patient was on Tricor 145 mg, Lipitor 10 mg daily. Recommend Lipitor 80 mg daily, Zetia 10 mg daily. She will need [facility]  outpatient.  COVID-pneumonia: Bilateral hazy opacities on chest x-ray 03/29/2021, improving on CXR 1/12    Elder Negus, MD Pager: [phone] Office: [phone]

## 2021-04-02 NOTE — Plan of Care (Signed)

## 2021-04-02 NOTE — Progress Notes (Signed)
4 Days Post-Op Procedure(s) (LRB): LEFT HEART CATH AND CORONARY ANGIOGRAPHY (N/A) Subjective: No complaints Stable on iv heparin, ntg CXR yesterday shows improved lung files- resolving covid pneumonitis  Objective: Vital signs in last 24 hours: Temp:  [97.6 F (36.4 C)-99.1 F (37.3 C)] 98.7 F (37.1 C) (01/13 1540) Pulse Rate:  [77-103] 93 (01/13 1540) Cardiac Rhythm: Normal sinus rhythm (01/13 0701) Resp:  [14-20] 17 (01/13 1540) BP: (102-151)/(59-89) 110/72 (01/13 1540) SpO2:  [83 %-95 %] 95 % (01/13 1540)  Hemodynamic parameters for last 24 hours:    Intake/Output from previous day: 01/12 0701 - 01/13 0700 In: 946.1 [P.O.:480; I.V.:466.1] Out: 1400 [Urine:1400] Intake/Output this shift: Total I/O In: 200 [P.O.:200] Out: 700 [Urine:700]       Exam    General- alert and comfortable    Neck- no JVD, no cervical adenopathy palpable, no carotid bruit   Lungs- clear without rales, wheezes   Cor- regular rate and rhythm, no murmur , gallop   Abdomen- soft, non-tender   Extremities - warm, non-tender, minimal edema   Neuro- oriented, appropriate, no focal weakness   Lab Results: Recent Labs    04/01/21 0534 04/02/21 0122  WBC 12.9* 12.5*  HGB 13.8 13.6  HCT 42.4 41.1  PLT 298 328   BMET:  Recent Labs    04/01/21 0534 04/02/21 0122  NA 142 139  K 3.8 3.9  CL 102 101  CO2 28 28  GLUCOSE 126* 147*  BUN 6* 9  CREATININE 0.88 0.89  CALCIUM 9.6 9.4    PT/INR: No results for input(s): LABPROT, INR in the last 72 hours. ABG No results found for: PHART, HCO3, TCO2, ACIDBASEDEF, O2SAT CBG (last 3)  No results for input(s): GLUCAP in the last 72 hours.  Assessment/Plan: S/P Procedure(s) (LRB): LEFT HEART CATH AND CORONARY ANGIOGRAPHY (N/A) Carotid dopplers today clear Plan multivessel CABG 1-17 for L main stenosis   LOS: 4 days    Lovett Sox 04/02/2021

## 2021-04-02 NOTE — Care Management Important Message (Signed)
Important Message  Patient Details  Name: Meredith Guerra MRN: [id number] Date of Birth: 05/08/1950   Medicare Important Message Given:  Yes     Renie Ora 04/02/2021, 1:37 PM

## 2021-04-02 NOTE — Progress Notes (Signed)
Chaplain Anne Sebring attempted Advance Directive Consultation with patient. Patient remain in isolation status. Spoke with patient's nurse who advised that it would be best to return later in the week for consultation. Chaplain will follow up tomorrow. °

## 2021-04-02 NOTE — Progress Notes (Signed)
[facility] for heparin  Indication: chest pain/ACS  Allergies  Allergen Reactions   Compazine [Prochlorperazine] Anxiety   Dilaudid [Hydromorphone]     Pt does not tolerate well   Haloperidol     homicidal thoughts   Reglan [Metoclopramide] Anxiety   Zolmitriptan Anxiety    Patient Measurements: Height: 5\' 3"  (160 cm) Weight: 76.8 kg (169 lb 5 oz) IBW/kg (Calculated) : 52.4 Heparin Dosing Weight: HEPARIN DW (KG): 69   Vital Signs: Temp: 98.8 F (37.1 C) (01/13 0337) Temp Source: Oral (01/13 0337) BP: 113/74 (01/13 0337) Pulse Rate: 80 (01/13 0337)  Labs: Recent Labs    03/31/21 0137 04/01/21 0534 04/02/21 0122  HGB 13.7 13.8 13.6  HCT 41.1 42.4 41.1  PLT 256 298 328  HEPARINUNFRC 0.47 0.36 0.33  CREATININE 1.07* 0.88 0.89     Estimated Creatinine Clearance: 58.6 mL/min (by C-G formula based on SCr of 0.89 mg/dL).   Medical History: Past Medical History:  Diagnosis Date   Asthma    Colitis    Hypertension    Kidney stones    Migraines     Medications:  Medications Prior to Admission  Medication Sig Dispense Refill Last Dose   acetaminophen (TYLENOL) 500 MG tablet Take 1,000 mg by mouth every 6 (six) hours as needed for mild pain, headache or fever.   Past Week   albuterol (PROVENTIL) 2 MG tablet Take 2 mg by mouth 3 (three) times daily as needed for wheezing or shortness of breath.   Past Week   albuterol (VENTOLIN HFA) 108 (90 Base) MCG/ACT inhaler Inhale 2 puffs into the lungs in the morning and at bedtime.   Past Week   amphetamine-dextroamphetamine (ADDERALL) 20 MG tablet Take 20 mg by mouth daily as needed (focus).   Past Week   atorvastatin (LIPITOR) 10 MG tablet Take 10 mg by mouth at bedtime.   unk   budesonide-formoterol (SYMBICORT) 160-4.5 MCG/ACT inhaler Inhale 2 puffs into the lungs 2 (two) times daily.   Past Week   butalbital-acetaminophen-caffeine (FIORICET) 50-325-40 MG tablet Take 1 tablet by mouth 2  (two) times daily as needed for migraine.   Past Week   calcium carbonate (TUMS - DOSED IN MG ELEMENTAL CALCIUM) 500 MG chewable tablet Chew 500 tablets by mouth daily as needed for indigestion or heartburn.   Past Week   Cholecalciferol (VITAMIN D-3) 25 MCG (1000 UT) CAPS Take 1,000 Units by mouth daily.   Past Week   clonazePAM (KLONOPIN) 0.5 MG tablet Take 0.5 mg by mouth daily as needed for anxiety.   Past Week   diphenhydrAMINE (BENADRYL) 25 mg capsule Take 25 mg by mouth every 6 (six) hours as needed for allergies.   Past Week   DULoxetine (CYMBALTA) 30 MG capsule Take 30 mg by mouth daily. Takes in addition to 60mg  dose for a total daily dose of 90mg    Past Week   DULoxetine (CYMBALTA) 60 MG capsule Take 60 mg by mouth at bedtime. Takes in addition to 30mg  dose for a total daily dose of 90mg    Past Week   fenofibrate (TRICOR) 145 MG tablet Take 145 mg by mouth daily.   unk   ibuprofen (ADVIL) 200 MG tablet Take 800 mg by mouth every 6 (six) hours as needed for headache or mild pain.   Past Week   levothyroxine (SYNTHROID) 25 MCG tablet Take 25 mcg by mouth See admin instructions. Take 1 and 1/2 every morning   Past Week  loratadine (CLARITIN) 10 MG tablet Take 10 mg by mouth at bedtime.   Past Week   montelukast (SINGULAIR) 10 MG tablet Take 10 mg by mouth at bedtime.   Past Week   ondansetron (ZOFRAN) 4 MG tablet Take 4 mg by mouth every 8 (eight) hours as needed for nausea or vomiting.   Past Week   pantoprazole (PROTONIX) 40 MG tablet Take 80 mg by mouth daily.   Past Week   PAXLOVID, 300/100, 20 x 150 MG & 10 x 100MG  TBPK Take 3 tablets by mouth in the morning and at bedtime. For 5 days   Past Week   promethazine (PHENERGAN) 12.5 MG tablet Take 12.5 mg by mouth every 4 (four) hours as needed for nausea or vomiting.   Past Week   propranolol (INDERAL) 80 MG tablet Take 80 mg by mouth 2 (two) times daily.   Past Week   Rimegepant Sulfate (NURTEC PO) Take by mouth as needed.   Past Month    tiZANidine (ZANAFLEX) 4 MG tablet Take 2-8 mg by mouth See admin instructions. Take 2 tablets every evening if needed take 1/2 to a whole if headache is bad   Past Week   traZODone (DESYREL) 150 MG tablet Take 150 mg by mouth at bedtime.   Past Week   Scheduled:   aspirin EC  81 mg Oral Daily   atorvastatin  80 mg Oral Daily   dapagliflozin propanediol  10 mg Oral Daily   DULoxetine  30 mg Oral QHS   DULoxetine  60 mg Oral Daily   ezetimibe  10 mg Oral Daily   fenofibrate  160 mg Oral Daily   fluticasone furoate-vilanterol  1 puff Inhalation Daily   levothyroxine  37.5 mcg Oral Q0600   loratadine  10 mg Oral Daily   losartan  12.5 mg Oral Daily   mouth rinse  15 mL Mouth Rinse BID   metoprolol tartrate  25 mg Oral BID   montelukast  10 mg Oral QHS   pantoprazole  40 mg Oral BID   sodium chloride flush  3 mL Intravenous Q12H   spironolactone  12.5 mg Oral Daily   traZODone  150 mg Oral QHS   Infusions:   sodium chloride Stopped (04/01/21 0001)   heparin 950 Units/hr (04/02/21 0600)   nitroGLYCERIN 25 mcg/min (04/02/21 0600)   PRN: sodium chloride, acetaminophen, albuterol, butalbital-acetaminophen-caffeine, clonazePAM, diphenhydrAMINE, heparin sodium (porcine), influenza vaccine adjuvanted, nitroGLYCERIN, ondansetron (ZOFRAN) IV, ondansetron, sodium chloride flush, tiZANidine Anti-infectives (From admission, onward)    None       Assessment: Meredith Guerra a 71 y.o. female requires anticoagulation with a heparin iv infusion for the indication of  chest pain/ACS. Heparin gtt will be started following pharmacy protocol per pharmacy consult.   Patient transferred to [facility] for cath as a code stemi and found to have multivessel disease and will have surgical consult. No s/sx of bleeding per nurse. Hgb and PLTs stable.  Heparin level continues to be therapeutic. Plan for CABG once stable with COVID infection clears. We will continue with the current rate and check daily.  Goal of  Therapy:  Heparin level 0.3-0.7 units/ml Monitor platelets by anticoagulation protocol: Yes   Plan:  Continue heparin infusion at 950 units/hr Continue to monitor H&H, platelets, s/sx of bleeding daily Surgery tentative for next week  Erin Hearing PharmD., BCPS Clinical Pharmacist 04/02/2021 7:50 AM

## 2021-04-03 LAB — CBC
HCT: 38.2 % (ref 36.0–46.0)
Hemoglobin: 12.5 g/dL (ref 12.0–15.0)
MCH: 28.1 pg (ref 26.0–34.0)
MCHC: 32.7 g/dL (ref 30.0–36.0)
MCV: 85.8 fL (ref 80.0–100.0)
Platelets: 265 10*3/uL (ref 150–400)
RBC: 4.45 MIL/uL (ref 3.87–5.11)
RDW: 13.9 % (ref 11.5–15.5)
WBC: 10.3 10*3/uL (ref 4.0–10.5)
nRBC: 0 % (ref 0.0–0.2)

## 2021-04-03 LAB — BASIC METABOLIC PANEL
Anion gap: 12 (ref 5–15)
BUN: 15 mg/dL (ref 8–23)
CO2: 29 mmol/L (ref 22–32)
Calcium: 9.6 mg/dL (ref 8.9–10.3)
Chloride: 99 mmol/L (ref 98–111)
Creatinine, Ser: 1.01 mg/dL — ABNORMAL HIGH (ref 0.44–1.00)
GFR, Estimated: >60 mL/min (ref >60)
Glucose, Bld: 129 mg/dL — ABNORMAL HIGH (ref 70–99)
Potassium: 3.5 mmol/L (ref 3.5–5.1)
Sodium: 140 mmol/L (ref 135–145)

## 2021-04-03 LAB — HEPARIN LEVEL (UNFRACTIONATED)
Heparin Unfractionated: 0.2 IU/mL — ABNORMAL LOW (ref 0.30–0.70)
Heparin Unfractionated: 0.4 IU/mL (ref 0.30–0.70)

## 2021-04-03 MED ORDER — ENOXAPARIN SODIUM 80 MG/0.8ML IJ SOSY
1.0000 mg/kg | PREFILLED_SYRINGE | Freq: Two times a day (BID) | INTRAMUSCULAR | Status: DC
  Administered 2021-04-03 – 2021-04-05 (×4): 77.5 mg via SUBCUTANEOUS
  Filled 2021-04-03 (×4): qty 0.8

## 2021-04-03 MED ORDER — ISOSORBIDE MONONITRATE ER 30 MG PO TB24
30.0000 mg | ORAL_TABLET | Freq: Every day | ORAL | Status: DC
  Administered 2021-04-03 – 2021-04-05 (×3): 30 mg via ORAL
  Filled 2021-04-03 (×3): qty 1

## 2021-04-03 NOTE — Progress Notes (Signed)
Subjective:  Presently doing well, has been present at the bedside, denies any chest pain or shortness of breath.  Objective:  Vital Signs in the last 24 hours: Temp:  [98.3 F (36.8 C)-98.8 F (37.1 C)] 98.3 F (36.8 C) (01/14 1148) Pulse Rate:  [84-108] 87 (01/14 1148) Resp:  [15-20] 15 (01/14 1148) BP: (110-135)/(62-93) 135/93 (01/14 1148) SpO2:  [91 %-95 %] 91 % (01/14 1148)  Intake/Output from previous day: 01/13 0701 - 01/14 0700 In: 768.1 [P.O.:380; I.V.:388.1] Out: 1300 [Urine:1300]  Physical Exam Vitals and nursing note reviewed.  Constitutional:      General: She is not in acute distress.    Appearance: She is well-developed.  HENT:     Head: Normocephalic and atraumatic.  Eyes:     Conjunctiva/sclera: Conjunctivae normal.     Pupils: Pupils are equal, round, and reactive to light.  Neck:     Vascular: No JVD.  Cardiovascular:     Rate and Rhythm: Normal rate and regular rhythm.     Pulses:          Dorsalis pedis pulses are 0 on the right side and 0 on the left side.       Posterior tibial pulses are 1+ on the right side and 1+ on the left side.     Heart sounds: No murmur heard. Pulmonary:     Effort: Pulmonary effort is normal.     Breath sounds: Normal breath sounds. No wheezing or rales.  Abdominal:     General: Bowel sounds are normal.     Palpations: Abdomen is soft.     Tenderness: There is no rebound.  Musculoskeletal:        General: No tenderness. Normal range of motion.     Right lower leg: No edema.     Left lower leg: No edema.  Lymphadenopathy:     Cervical: No cervical adenopathy.  Skin:    General: Skin is warm and dry.  Neurological:     Mental Status: She is alert and oriented to person, place, and time.     Cranial Nerves: No cranial nerve deficit.     Lab Results: BMP Recent Labs    04/01/21 0534 04/02/21 0122 04/03/21 0038  NA 142 139 140  K 3.8 3.9 3.5  CL 102 101 99  CO2 28 28 29   GLUCOSE 126* 147* 129*  BUN 6* 9  15  CREATININE 0.88 0.89 1.01*  CALCIUM 9.6 9.4 9.6  GFRNONAA >60 >60 >60     CBC Recent Labs  Lab 04/03/21 0038  WBC 10.3  RBC 4.45  HGB 12.5  HCT 38.2  PLT 265  MCV 85.8  MCH 28.1  MCHC 32.7  RDW 13.9    Cardiac Panel (last 3 results)  Latest Reference Range & Units 03/29/21 19:38 03/30/21 04:32  Troponin I (High Sensitivity) <18 ng/L 392 (HH) 1,583 (HH)  [employer]): Data is critically high   TSH Recent Labs    01/28/21 1151  TSH 2.84     Lipid Panel     Component Value Date/Time   CHOL 327 (H) 03/30/2021 0432   TRIG 221 (H) 03/30/2021 0432   HDL 51 03/30/2021 0432   CHOLHDL 6.4 03/30/2021 0432   VLDL 44 (H) 03/30/2021 0432   LDLCALC 232 (H) 03/30/2021 0432     Hepatic Function Panel Recent Labs    01/28/21 1151 03/30/21 0432  PROT 7.6 7.8  ALBUMIN  --  4.1  AST 25 36  ALT  26 28  ALKPHOS  --  96  BILITOT 0.5 0.7     Imaging: CXR 03/29/2021: Heart is normal size. Patchy bilateral upper lobe airspace opacities. No effusions. No acute bony abnormality.   IMPRESSION: Patchy bilateral upper lobe opacities, right greater than left.    Cardiac Studies:  EKG 04/01/2020: Evolving changes of anteroseptal infarct No new ST elevations  Cardiac MRI 03/31/2021: 1.  No late gadolinium enhancement, suggesting myocardium is viable 2. Normal LV size with mild systolic dysfunction (EF 50%). Apical hypokinesis 3.  Small RV size with normal systolic function (EF 66%)    Echocardiogram 03/30/2021: left ventricle has moderately decreased function. The left ventricle  demonstrates regional wall motion abnormalities (see scoring  diagram/findings for description). Left ventricular   diastolic parameters are consistent with Grade I diastolic dysfunction  (impaired relaxation). There is akinesis of the left ventricular,  mid-apical anteroseptal wall. There is severe hypokinesis of the left  ventricular, mid-apical inferolateral wall.   2. Right ventricular  systolic function is low normal. The right  ventricular size is normal.   3. The mitral valve is normal in structure. No evidence of mitral valve  regurgitation. No evidence of mitral stenosis.   4. The aortic valve is tricuspid. Aortic valve regurgitation is not  visualized.   EKG 03/29/2021: Sinus rhythm.  Acute anterolateral injury  CXR 03/29/2021: Patchy bilateral upper lobe opacities, right greater than left.  Assessment & Recommendations:  71 y.o. Caucasian female  with hypertension, mixed hyperlipidemia, asthma, remote history of Takotsubo cardiomyopathy, recent diagnosis of COVID, completed 5-day therapy with PACs elevated on 03/29/2021, admitted with acute coronary syndrome  Acute coronary syndrome/NSTEMI: Initially presented with ST elevation on EKG.  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 Hemodynamically stable. Chest pain free on IV nitro Discontinue IV NTG and IV Heparin and switch to SQ lovenox.   Cardiomyopathy: Global LVEF 30-35% on echo 1/10 with preserved basal wall motion, and akinesis to severe hypokinesis of entire mid to apical myocardium. However, MRI 1/11 showed only apical hypokinesis, EF 50%. No scar. Presently on BB and ARB. Continue the same  Mixed hyperlipidemia: On Statin and Tricor will f/u OP for appropriate therapy  COVID-pneumonia: Bilateral hazy opacities on chest x-ray 03/29/2021, improving on CXR 1/12 Normal exam and no hypoxemia  Overall patient is stable, no cough, no fever, no chills, husband is present at the bedside, I have discussed the changes of IV nitroglycerin to oral nitroglycerin, changing IV heparin to subcu Lovenox with the patient and also with the nursing staff.  She remains angina free.    Elder Negus, MD Pager: [phone] Office: [phone]

## 2021-04-03 NOTE — Plan of Care (Signed)

## 2021-04-03 NOTE — Progress Notes (Signed)
ANTICOAGULATION CONSULT NOTE Pharmacy Consult for heparin  Indication: CAD Brief A/P: Heparin level subtherapeutic Increase Heparin rate  Allergies  Allergen Reactions   Compazine [Prochlorperazine] Anxiety   Dilaudid [Hydromorphone]     Pt does not tolerate well   Haloperidol     homicidal thoughts   Reglan [Metoclopramide] Anxiety   Zolmitriptan Anxiety    Patient Measurements: Height: 5\' 3"  (160 cm) Weight: 76.8 kg (169 lb 5 oz) IBW/kg (Calculated) : 52.4 Heparin Dosing Weight: HEPARIN DW (KG): 69   Vital Signs: Temp: 98.8 F (37.1 C) (01/13 2313) Temp Source: Oral (01/13 2313) BP: 129/77 (01/13 2313) Pulse Rate: 98 (01/13 2313)  Labs: Recent Labs    04/01/21 0534 04/02/21 0122 04/03/21 0038  HGB 13.8 13.6 12.5  HCT 42.4 41.1 38.2  PLT 298 328 265  HEPARINUNFRC 0.36 0.33 0.20*  CREATININE 0.88 0.89 1.01*     Estimated Creatinine Clearance: 51.6 mL/min (A) (by C-G formula based on SCr of 1.01 mg/dL (H)).   Assessment: 71 y.o. female admitted with STEMI s/p cath, awaiting CABG, for heparin  Goal of Therapy:  Heparin level 0.3-0.7 units/ml Monitor platelets by anticoagulation protocol: Yes   Plan:  Increase Heparin 1100 units/hr Check heparin level in 8 hours.   Phillis Knack, PharmD, BCPS  04/03/2021 2:13 AM

## 2021-04-03 NOTE — Progress Notes (Signed)
ANTICOAGULATION CONSULT NOTE Pharmacy Consult for heparin  Indication: CAD Brief A/P: Heparin level subtherapeutic Increase Heparin rate  Allergies  Allergen Reactions   Compazine [Prochlorperazine] Anxiety   Dilaudid [Hydromorphone]     Pt does not tolerate well   Haloperidol     homicidal thoughts   Reglan [Metoclopramide] Anxiety   Zolmitriptan Anxiety    Patient Measurements: Height: 5\' 3"  (160 cm) Weight: 76.8 kg (169 lb 5 oz) IBW/kg (Calculated) : 52.4 Heparin Dosing Weight: HEPARIN DW (KG): 69   Vital Signs: Temp: 98.6 F (37 C) (01/14 0737) Temp Source: Oral (01/14 0737) BP: 124/74 (01/14 0737) Pulse Rate: 108 (01/14 0737)  Labs: Recent Labs    04/01/21 0534 04/02/21 0122 04/03/21 0038 04/03/21 0939  HGB 13.8 13.6 12.5  --   HCT 42.4 41.1 38.2  --   PLT 298 328 265  --   HEPARINUNFRC 0.36 0.33 0.20* 0.40  CREATININE 0.88 0.89 1.01*  --      Estimated Creatinine Clearance: 51.6 mL/min (A) (by C-G formula based on SCr of 1.01 mg/dL (H)).   Assessment: 71 y.o. female admitted with STEMI s/p cath, awaiting CABG, on heparin. Heparin level this AM therapeutic at 0.4 on 1100 units/hr. Hemoglobin dropped from 13.6 to 12.5. Platelets dropped from 328 to 265. No issues with bleeding per RN.  Goal of Therapy:  Heparin level 0.3-0.7 units/ml Monitor platelets by anticoagulation protocol: Yes   Plan:  Continue heparin 1100 units/hr Check heparin level in 8 hours.  Daily heparin level, CBC while on heparin.   Cathrine Muster, PharmD PGY2 Cardiology Pharmacy Resident Phone: [phone] 04/03/2021  10:42 AM  Please check [email or web address] for unit-specific pharmacy phone numbers.

## 2021-04-04 LAB — BASIC METABOLIC PANEL
Anion gap: 11 (ref 5–15)
BUN: 13 mg/dL (ref 8–23)
CO2: 26 mmol/L (ref 22–32)
Calcium: 9.7 mg/dL (ref 8.9–10.3)
Chloride: 102 mmol/L (ref 98–111)
Creatinine, Ser: 1.16 mg/dL — ABNORMAL HIGH (ref 0.44–1.00)
GFR, Estimated: 51 mL/min — ABNORMAL LOW (ref >60)
Glucose, Bld: 132 mg/dL — ABNORMAL HIGH (ref 70–99)
Potassium: 4 mmol/L (ref 3.5–5.1)
Sodium: 139 mmol/L (ref 135–145)

## 2021-04-04 LAB — CBC
HCT: 39.9 % (ref 36.0–46.0)
Hemoglobin: 13 g/dL (ref 12.0–15.0)
MCH: 28.3 pg (ref 26.0–34.0)
MCHC: 32.6 g/dL (ref 30.0–36.0)
MCV: 86.7 fL (ref 80.0–100.0)
Platelets: 253 10*3/uL (ref 150–400)
RBC: 4.6 MIL/uL (ref 3.87–5.11)
RDW: 14 % (ref 11.5–15.5)
WBC: 11 10*3/uL — ABNORMAL HIGH (ref 4.0–10.5)
nRBC: 0 % (ref 0.0–0.2)

## 2021-04-04 NOTE — Progress Notes (Signed)
Subjective:  Presently doing well, daughter present at the bedside, denies any chest pain or shortness of breath.  Objective:  Vital Signs in the last 24 hours: Temp:  [98.1 F (36.7 C)-98.5 F (36.9 C)] 98.1 F (36.7 C) (01/15 0514) Pulse Rate:  [79-99] 94 (01/15 0514) Resp:  [10-20] 19 (01/15 0514) BP: (128-142)/(72-93) 138/79 (01/15 0514) SpO2:  [91 %-95 %] 94 % (01/15 0514)  Intake/Output from previous day: 01/14 0701 - 01/15 0700 In: 240 [P.O.:240] Out: 1800 [Urine:1800]  Physical Exam Vitals and nursing note reviewed.  Constitutional:      General: She is not in acute distress.    Appearance: She is well-developed.  HENT:     Head: Normocephalic and atraumatic.  Eyes:     Conjunctiva/sclera: Conjunctivae normal.     Pupils: Pupils are equal, round, and reactive to light.  Neck:     Vascular: No JVD.  Cardiovascular:     Rate and Rhythm: Normal rate and regular rhythm.     Pulses:          Dorsalis pedis pulses are 0 on the right side and 0 on the left side.       Posterior tibial pulses are 1+ on the right side and 1+ on the left side.     Heart sounds: No murmur heard. Pulmonary:     Effort: Pulmonary effort is normal.     Breath sounds: Normal breath sounds. No wheezing or rales.  Abdominal:     General: Bowel sounds are normal.     Palpations: Abdomen is soft.     Tenderness: There is no rebound.  Musculoskeletal:        General: No tenderness. Normal range of motion.     Right lower leg: No edema.     Left lower leg: No edema.  Lymphadenopathy:     Cervical: No cervical adenopathy.  Skin:    General: Skin is warm and dry.  Neurological:     Mental Status: She is alert and oriented to person, place, and time.     Cranial Nerves: No cranial nerve deficit.   No change in physical exam since yesterday  Lab Results: BMP Recent Labs    04/02/21 0122 04/03/21 0038 04/04/21 0033  NA 139 140 139  K 3.9 3.5 4.0  CL 101 99 102  CO2 28 29 26   GLUCOSE  147* 129* 132*  BUN 9 15 13   CREATININE 0.89 1.01* 1.16*  CALCIUM 9.4 9.6 9.7  GFRNONAA >60 >60 51*     CBC Recent Labs  Lab 04/04/21 0033  WBC 11.0*  RBC 4.60  HGB 13.0  HCT 39.9  PLT 253  MCV 86.7  MCH 28.3  MCHC 32.6  RDW 14.0    Cardiac Panel (last 3 results)  Latest Reference Range & Units 03/29/21 19:38 03/30/21 04:32  Troponin I (High Sensitivity) <18 ng/L 392 (HH) 1,583 (HH)  [facility]): Data is critically high   TSH Recent Labs    01/28/21 1151  TSH 2.84     Lipid Panel     Component Value Date/Time   CHOL 327 (H) 03/30/2021 0432   TRIG 221 (H) 03/30/2021 0432   HDL 51 03/30/2021 0432   CHOLHDL 6.4 03/30/2021 0432   VLDL 44 (H) 03/30/2021 0432   LDLCALC 232 (H) 03/30/2021 0432     Hepatic Function Panel Recent Labs    01/28/21 1151 03/30/21 0432  PROT 7.6 7.8  ALBUMIN  --  4.1  AST  25 36  ALT 26 28  ALKPHOS  --  96  BILITOT 0.5 0.7     Imaging: CXR 03/29/2021: Heart is normal size. Patchy bilateral upper lobe airspace opacities. No effusions. No acute bony abnormality.   IMPRESSION: Patchy bilateral upper lobe opacities, right greater than left.    Cardiac Studies:  EKG 04/01/2020: Evolving changes of anteroseptal infarct No new ST elevations  Cardiac MRI 03/31/2021: 1.  No late gadolinium enhancement, suggesting myocardium is viable 2. Normal LV size with mild systolic dysfunction (EF [id number]). Apical hypokinesis 3.  Small RV size with normal systolic function (EF [id number])    Echocardiogram 03/30/2021: left ventricle has moderately decreased function. The left ventricle  demonstrates regional wall motion abnormalities (see scoring  diagram/findings for description). Left ventricular   diastolic parameters are consistent with Grade I diastolic dysfunction  (impaired relaxation). There is akinesis of the left ventricular,  mid-apical anteroseptal wall. There is severe hypokinesis of the left  ventricular, mid-apical inferolateral wall.    2. Right ventricular systolic function is low normal. The right  ventricular size is normal.   3. The mitral valve is normal in structure. No evidence of mitral valve  regurgitation. No evidence of mitral stenosis.   4. The aortic valve is tricuspid. Aortic valve regurgitation is not  visualized.   EKG 03/29/2021: Sinus rhythm.  Acute anterolateral injury  CXR 03/29/2021: Patchy bilateral upper lobe opacities, right greater than left.  Assessment & Recommendations:  71 y.o. Caucasian female  with hypertension, mixed hyperlipidemia, asthma, remote history of Takotsubo cardiomyopathy, recent diagnosis of COVID, completed 5-day therapy with PACs elevated on 03/29/2021, admitted with acute coronary syndrome  Acute coronary syndrome/NSTEMI: Initially presented with ST elevation on EKG.  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 Hemodynamically stable. Chest pain free.  IV nitroglycerin has been switched over to Imdur and and IV Heparin  to SQ lovenox unstable angina dose, will need to be held prior to surgery 1 day before  Ischemic cardiomyopathy: Global LVEF 30-35% on echo 1/10 with preserved basal wall motion, and akinesis to severe hypokinesis of entire mid to apical myocardium. However, MRI 1/11 showed only apical hypokinesis, EF 50%. No scar. Presently on BB and ARB. Continue the same  Mixed hyperlipidemia: On Statin and Tricor will f/u OP for appropriate therapy  COVID-pneumonia: Bilateral hazy opacities on chest x-ray 03/29/2021, improving on CXR 1/12 Normal exam and no hypoxemia  Overall patient is stable, no cough, no fever, no chills. She remains angina free.   Adrian Prows, MD, [facility] 04/04/2021, 11:09 AM Office: [phone] Fax: [phone] Pager: [phone]

## 2021-04-05 DIAGNOSIS — I2510 Atherosclerotic heart disease of native coronary artery without angina pectoris: Secondary | ICD-10-CM

## 2021-04-05 LAB — CBC
HCT: 44.4 % (ref 36.0–46.0)
Hemoglobin: 14.5 g/dL (ref 12.0–15.0)
MCH: 28.4 pg (ref 26.0–34.0)
MCHC: 32.7 g/dL (ref 30.0–36.0)
MCV: 87.1 fL (ref 80.0–100.0)
Platelets: 266 10*3/uL (ref 150–400)
RBC: 5.1 MIL/uL (ref 3.87–5.11)
RDW: 14 % (ref 11.5–15.5)
WBC: 12.5 10*3/uL — ABNORMAL HIGH (ref 4.0–10.5)
nRBC: 0 % (ref 0.0–0.2)

## 2021-04-05 LAB — PREPARE RBC (CROSSMATCH)

## 2021-04-05 LAB — ABO/RH: ABO/RH(D): O POS

## 2021-04-05 MED ORDER — METOPROLOL TARTRATE 12.5 MG HALF TABLET
12.5000 mg | ORAL_TABLET | Freq: Once | ORAL | Status: AC
  Administered 2021-04-06: 12.5 mg via ORAL
  Filled 2021-04-05: qty 1

## 2021-04-05 MED ORDER — CHLORHEXIDINE GLUCONATE 0.12 % MT SOLN
15.0000 mL | Freq: Once | OROMUCOSAL | Status: AC
  Administered 2021-04-06: 15 mL via OROMUCOSAL
  Filled 2021-04-05: qty 15

## 2021-04-05 MED ORDER — TRANEXAMIC ACID (OHS) PUMP PRIME SOLUTION
2.0000 mg/kg | INTRAVENOUS | Status: DC
  Filled 2021-04-05: qty 1.54

## 2021-04-05 MED ORDER — POTASSIUM CHLORIDE 2 MEQ/ML IV SOLN
80.0000 meq | INTRAVENOUS | Status: DC
  Filled 2021-04-05: qty 40

## 2021-04-05 MED ORDER — TRANEXAMIC ACID (OHS) BOLUS VIA INFUSION
15.0000 mg/kg | INTRAVENOUS | Status: AC
  Administered 2021-04-06: 1152 mg via INTRAVENOUS
  Filled 2021-04-05: qty 1152

## 2021-04-05 MED ORDER — HEPARIN (PORCINE) 25000 UT/250ML-% IV SOLN
800.0000 [IU]/h | INTRAVENOUS | Status: DC
  Administered 2021-04-05: 800 [IU]/h via INTRAVENOUS
  Filled 2021-04-05: qty 250

## 2021-04-05 MED ORDER — CHLORHEXIDINE GLUCONATE 4 % EX LIQD
60.0000 mL | Freq: Once | CUTANEOUS | Status: AC
  Administered 2021-04-06: 4 via TOPICAL
  Filled 2021-04-05 (×2): qty 60

## 2021-04-05 MED ORDER — NOREPINEPHRINE 4 MG/250ML-% IV SOLN
0.0000 ug/min | INTRAVENOUS | Status: AC
  Administered 2021-04-06: 2 ug/min via INTRAVENOUS
  Filled 2021-04-05: qty 250

## 2021-04-05 MED ORDER — BISACODYL 5 MG PO TBEC
5.0000 mg | DELAYED_RELEASE_TABLET | Freq: Once | ORAL | Status: AC
  Administered 2021-04-05: 5 mg via ORAL
  Filled 2021-04-05: qty 1

## 2021-04-05 MED ORDER — CHLORHEXIDINE GLUCONATE 4 % EX LIQD
60.0000 mL | Freq: Once | CUTANEOUS | Status: AC
  Administered 2021-04-05: 4 via TOPICAL
  Filled 2021-04-05 (×2): qty 60

## 2021-04-05 MED ORDER — CEFAZOLIN SODIUM-DEXTROSE 2-4 GM/100ML-% IV SOLN
2.0000 g | INTRAVENOUS | Status: AC
  Administered 2021-04-06: 2 g via INTRAVENOUS
  Filled 2021-04-05: qty 100

## 2021-04-05 MED ORDER — MAGNESIUM SULFATE 50 % IJ SOLN
40.0000 meq | INTRAMUSCULAR | Status: DC
  Filled 2021-04-05: qty 9.85

## 2021-04-05 MED ORDER — PHENYLEPHRINE HCL-NACL 20-0.9 MG/250ML-% IV SOLN
30.0000 ug/min | INTRAVENOUS | Status: AC
  Administered 2021-04-06: 10 ug/min via INTRAVENOUS
  Administered 2021-04-06: 25 ug/min via INTRAVENOUS
  Filled 2021-04-05: qty 250

## 2021-04-05 MED ORDER — VANCOMYCIN HCL 1250 MG/250ML IV SOLN
1250.0000 mg | INTRAVENOUS | Status: AC
  Administered 2021-04-06: 1250 mg via INTRAVENOUS
  Filled 2021-04-05: qty 250

## 2021-04-05 MED ORDER — MILRINONE LACTATE IN DEXTROSE 20-5 MG/100ML-% IV SOLN
0.3000 ug/kg/min | INTRAVENOUS | Status: AC
  Administered 2021-04-06: .25 ug/kg/min via INTRAVENOUS
  Filled 2021-04-05: qty 100

## 2021-04-05 MED ORDER — ENOXAPARIN SODIUM 80 MG/0.8ML IJ SOSY: 80.0000 mg | PREFILLED_SYRINGE | Freq: Two times a day (BID) | INTRAMUSCULAR | Status: DC

## 2021-04-05 MED ORDER — TRANEXAMIC ACID 1000 MG/10ML IV SOLN
1.5000 mg/kg/h | INTRAVENOUS | Status: AC
  Administered 2021-04-06: 1.5 mg/kg/h via INTRAVENOUS
  Filled 2021-04-05: qty 25

## 2021-04-05 MED ORDER — TEMAZEPAM 15 MG PO CAPS
15.0000 mg | ORAL_CAPSULE | Freq: Once | ORAL | Status: AC | PRN
  Administered 2021-04-05: 15 mg via ORAL
  Filled 2021-04-05: qty 1

## 2021-04-05 MED ORDER — PLASMA-LYTE A IV SOLN
INTRAVENOUS | Status: DC
  Filled 2021-04-05: qty 5

## 2021-04-05 MED ORDER — EPINEPHRINE HCL 5 MG/250ML IV SOLN IN NS
0.0000 ug/min | INTRAVENOUS | Status: DC
  Filled 2021-04-05: qty 250

## 2021-04-05 MED ORDER — NITROGLYCERIN IN D5W 200-5 MCG/ML-% IV SOLN
2.0000 ug/min | INTRAVENOUS | Status: DC
  Filled 2021-04-05: qty 250

## 2021-04-05 MED ORDER — DEXMEDETOMIDINE HCL IN NACL 400 MCG/100ML IV SOLN
0.1000 ug/kg/h | INTRAVENOUS | Status: AC
  Administered 2021-04-06: .7 ug/kg/h via INTRAVENOUS
  Filled 2021-04-05: qty 100

## 2021-04-05 MED ORDER — HEPARIN 30,000 UNITS/1000 ML (OHS) CELLSAVER SOLUTION
Status: DC
  Filled 2021-04-05: qty 1000

## 2021-04-05 MED ORDER — INSULIN REGULAR(HUMAN) IN NACL 100-0.9 UT/100ML-% IV SOLN
INTRAVENOUS | Status: AC
  Administered 2021-04-06: 1.4 [IU]/h via INTRAVENOUS
  Filled 2021-04-05: qty 100

## 2021-04-05 NOTE — Progress Notes (Signed)
ANTICOAGULATION CONSULT NOTE - Initial Consult  Pharmacy Consult for heparin Indication: chest pain/ACS  Allergies  Allergen Reactions   Compazine [Prochlorperazine] Anxiety   Dilaudid [Hydromorphone]     Pt does not tolerate well   Haloperidol     homicidal thoughts   Reglan [Metoclopramide] Anxiety   Zolmitriptan Anxiety    Patient Measurements: Height: 5\' 3"  (160 cm) Weight: 76.8 kg (169 lb 5 oz) IBW/kg (Calculated) : 52.4 Heparin Dosing Weight: 76kg  Vital Signs: Temp: 97.5 F (36.4 C) (01/16 1730) Temp Source: Oral (01/16 1730) BP: 120/70 (01/16 1730) Pulse Rate: 112 (01/16 1730)  Labs: Recent Labs    04/03/21 0038 04/03/21 0939 04/04/21 0033 04/05/21 0045  HGB 12.5  --  13.0 14.5  HCT 38.2  --  39.9 44.4  PLT 265  --  253 266  HEPARINUNFRC 0.20* 0.40  --   --   CREATININE 1.01*  --  1.16*  --     Estimated Creatinine Clearance: 44.9 mL/min (A) (by C-G formula based on SCr of 1.16 mg/dL (H)).   Medical History: Past Medical History:  Diagnosis Date   Asthma    Colitis    Hypertension    Kidney stones    Migraines      Assessment: 33 yoF with STEMI planning for CABG in am. Heparin transitioned to enoxaparin earlier in admit, last dose this am, heparin drip ordered overnight by TCTS. Pt previously therapeutic on 1100 units/h - will start lower with recent enoxaparin and plans for CABG in am.  Goal of Therapy:  Heparin level 0.3-0.7 units/ml Monitor platelets by anticoagulation protocol: Yes   Plan:  Heparin 800 units/h no bolus - stop on call to [place], PharmD, BCPS, [place] Pharmacist [phone] Please check AMION for all [facility] Pharmacy numbers 04/05/2021

## 2021-04-05 NOTE — Progress Notes (Addendum)
Subjective:  Sleeping peacefully this morning  I came back to see her in the evening and spoke to the patient   Objective:  Vital Signs in the last 24 hours: Temp:  [97.6 F (36.4 C)-98.7 F (37.1 C)] 98 F (36.7 C) (01/16 0545) Pulse Rate:  [80-98] 80 (01/16 0545) Resp:  [17-21] 17 (01/16 0545) BP: (109-146)/(53-78) 118/76 (01/16 0545) SpO2:  [93 %-98 %] 98 % (01/16 0545)  Intake/Output from previous day: 01/15 0701 - 01/16 0700 In: 236 [P.O.:236] Out: 1600 [Urine:1600]  Physical Exam Vitals and nursing note reviewed.  Constitutional:      General: She is not in acute distress.    Appearance: She is well-developed.     Comments: Sleeping this morning at the time of my visit  HENT:     Head: Normocephalic and atraumatic.  Eyes:     Conjunctiva/sclera: Conjunctivae normal.     Pupils: Pupils are equal, round, and reactive to light.  Neck:     Vascular: No JVD.  Cardiovascular:     Rate and Rhythm: Regular rhythm. Tachycardia present.     Pulses: Intact distal pulses.          Dorsalis pedis pulses are 0 on the right side and 0 on the left side.       Posterior tibial pulses are 1+ on the right side and 1+ on the left side.     Heart sounds: No murmur heard. Pulmonary:     Effort: Pulmonary effort is normal.     Breath sounds: Normal breath sounds. No wheezing or rales.  Abdominal:     General: Bowel sounds are normal.     Palpations: Abdomen is soft.     Tenderness: There is no rebound.  Musculoskeletal:        General: No tenderness. Normal range of motion.     Right lower leg: No edema.     Left lower leg: No edema.  Lymphadenopathy:     Cervical: No cervical adenopathy.  Skin:    General: Skin is warm and dry.  Neurological:     Mental Status: She is alert and oriented to person, place, and time.     Cranial Nerves: No cranial nerve deficit.     Lab Results: BMP Recent Labs    04/02/21 0122 04/03/21 0038 04/04/21 0033  NA 139 140 139  K 3.9 3.5  4.0  CL 101 99 102  CO2 28 29 26   GLUCOSE 147* 129* 132*  BUN 9 15 13   CREATININE 0.89 1.01* 1.16*  CALCIUM 9.4 9.6 9.7  GFRNONAA >60 >60 51*     CBC Recent Labs  Lab 04/05/21 0045  WBC 12.5*  RBC 5.10  HGB 14.5  HCT 44.4  PLT 266  MCV 87.1  MCH 28.4  MCHC 32.7  RDW 14.0      Cardiac Panel (last 3 results)  Latest Reference Range & Units 03/29/21 19:38 03/30/21 04:32  Troponin I (High Sensitivity) <18 ng/L 392 (HH) 1,583 (HH)  (HH): Data is critically high   TSH Recent Labs    01/28/21 1151  TSH 2.84     Lipid Panel     Component Value Date/Time   CHOL 327 (H) 03/30/2021 0432   TRIG 221 (H) 03/30/2021 0432   HDL 51 03/30/2021 0432   CHOLHDL 6.4 03/30/2021 0432   VLDL 44 (H) 03/30/2021 0432   LDLCALC 232 (H) 03/30/2021 0432     Hepatic Function Panel Recent Labs  01/28/21 1151 03/30/21 0432  PROT 7.6 7.8  ALBUMIN  --  4.1  AST 25 36  ALT 26 28  ALKPHOS  --  96  BILITOT 0.5 0.7     Imaging: CXR 03/29/2021: Heart is normal size. Patchy bilateral upper lobe airspace opacities. No effusions. No acute bony abnormality.   IMPRESSION: Patchy bilateral upper lobe opacities, right greater than left.    Cardiac Studies:  EKG 04/01/2020: Evolving changes of anteroseptal infarct No new ST elevations  Cardiac MRI 03/31/2021: 1.  No late gadolinium enhancement, suggesting myocardium is viable 2. Normal LV size with mild systolic dysfunction (EF 50%). Apical hypokinesis 3.  Small RV size with normal systolic function (EF 66%)    Echocardiogram 03/30/2021: left ventricle has moderately decreased function. The left ventricle  demonstrates regional wall motion abnormalities (see scoring  diagram/findings for description). Left ventricular   diastolic parameters are consistent with Grade I diastolic dysfunction  (impaired relaxation). There is akinesis of the left ventricular,  mid-apical anteroseptal wall. There is severe hypokinesis of the  left  ventricular, mid-apical inferolateral wall.   2. Right ventricular systolic function is low normal. The right  ventricular size is normal.   3. The mitral valve is normal in structure. No evidence of mitral valve  regurgitation. No evidence of mitral stenosis.   4. The aortic valve is tricuspid. Aortic valve regurgitation is not  visualized.   EKG 03/29/2021: Sinus rhythm.  Acute anterolateral injury  CXR 03/29/2021: Patchy bilateral upper lobe opacities, right greater than left.  Assessment & Recommendations:  71 y.o. Caucasian female  with hypertension, mixed hyperlipidemia, asthma, remote history of Takotsubo cardiomyopathy, recent diagnosis of COVID, completed 5-day therapy with PACs elevated on 03/29/2021, admitted with acute coronary syndrome  Acute coronary syndrome: Initially presented with ST elevation on EKG.  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 Hemodynamically stable. Chest pain free on PO Imdur Continue aspirin, heparin, metoprolol tartrate 25 mg bid, losartan 12.5 mg, spironolactone 12.5, Farxiga 10 Appreciate CVTS consult, awaiting CABG  Cardiomyopathy: Global LVEF 30-35% on echo 1/10 with preserved basal wall motion, and akinesis to severe hypokinesis of entire mid to apical myocardium. However, MRI 1/11 showed only apical hypokinesis, EF 50%. No scar. Continue metoprolol tartrate 25 mg bid, Imdur 30 mg, losartan 12.5 mg, spironolactone 12.5, Farxiga 10,  Heart rate is up today, but denies any chest pain, dyspnea. I asked the nurse to proceed with scheduled metoprolol dose earlier in the evening   Mixed hyperlipidemia: LDL 232.  At baseline, patient was on Tricor 145 mg, Lipitor 10 mg daily. Recommend Lipitor 80 mg daily, Zetia 10 mg daily. She will need Repatha outpatient.  COVID-pneumonia: Bilateral hazy opacities on chest x-ray 03/29/2021,  improving on CXR 1/12    Elder Negus, MD Pager: [phone] Office: [phone]

## 2021-04-05 NOTE — Progress Notes (Signed)
7 Days Post-Op Procedure(s) (LRB): LEFT HEART CATH AND CORONARY ANGIOGRAPHY (N/A) Subjective: No more chest pain, no shortness of breath Patient had a bowel movement today  Objective: Vital signs in last 24 hours: Temp:  [98 F (36.7 C)-98.7 F (37.1 C)] 98.1 F (36.7 C) (01/16 1132) Pulse Rate:  [80-115] 115 (01/16 1132) Cardiac Rhythm: Normal sinus rhythm (01/16 0735) Resp:  [17-25] 25 (01/16 1115) BP: (113-146)/(53-84) 132/71 (01/16 1115) SpO2:  [93 %-98 %] 94 % (01/16 1115)  Hemodynamic parameters for last 24 hours: Sinus rhythm  Intake/Output from previous day: 01/15 0701 - 01/16 0700 In: 236 [P.O.:236] Out: 1600 [Urine:1600] Intake/Output this shift: Total I/O In: 240 [P.O.:240] Out: -        Exam    General- alert and comfortable    Neck- no JVD, no cervical adenopathy palpable, no carotid bruit   Lungs- clear without rales, wheezes   Cor- regular rate and rhythm, no murmur , gallop   Abdomen- soft, non-tender   Extremities - warm, non-tender, minimal edema   Neuro- oriented, appropriate, no focal weakness   Lab Results: Recent Labs    04/04/21 0033 04/05/21 0045  WBC 11.0* 12.5*  HGB 13.0 14.5  HCT 39.9 44.4  PLT 253 266   BMET:  Recent Labs    04/03/21 0038 04/04/21 0033  NA 140 139  K 3.5 4.0  CL 99 102  CO2 29 26  GLUCOSE 129* 132*  BUN 15 13  CREATININE 1.01* 1.16*  CALCIUM 9.6 9.7    PT/INR: No results for input(s): LABPROT, INR in the last 72 hours. ABG No results found for: PHART, HCO3, TCO2, ACIDBASEDEF, O2SAT CBG (last 3)  No results for input(s): GLUCAP in the last 72 hours.  Assessment/Plan: S/P Procedure(s) (LRB): LEFT HEART CATH AND CORONARY ANGIOGRAPHY (N/A) Plan multivessel bypass grafting in the a.m.  Patient understands the benefits and risks of the procedure.  She understands the use of general anesthesia and cardiopulmonary bypass and the location of the surgical incisions.  She agrees to proceed with surgery.    LOS: 7 days    Lovett Sox 04/05/2021

## 2021-04-05 NOTE — Plan of Care (Signed)

## 2021-04-05 NOTE — Anesthesia Preprocedure Evaluation (Addendum)
Anesthesia Evaluation  Patient identified by MRN, date of birth, ID band Patient awake    Reviewed: Allergy & Precautions, NPO status , Patient's Chart, lab work & pertinent test results, reviewed documented beta blocker date and time   Airway Mallampati: II  TM Distance: >3 FB Neck ROM: Full    Dental  (+) Dental Advisory Given, Edentulous Upper, Edentulous Lower   Pulmonary asthma ,    Pulmonary exam normal breath sounds clear to auscultation       Cardiovascular hypertension, Pt. on home beta blockers + Past MI  Normal cardiovascular exam Rhythm:Regular Rate:Normal  Echo 03/30/21: IMPRESSIONS    1. Left ventricular ejection fraction, by estimation, is 30 to 35%. The  left ventricle has moderately decreased function. The left ventricle  demonstrates regional wall motion abnormalities (see scoring  diagram/findings for description). Left ventricular  diastolic parameters are consistent with Grade I diastolic dysfunction  (impaired relaxation). There is akinesis of the left ventricular,  mid-apical anteroseptal wall. There is severe hypokinesis of the left  ventricular, mid-apical inferolateral wall.  2. Right ventricular systolic function is low normal. The right  ventricular size is normal.  3. The mitral valve is normal in structure. No evidence of mitral valve  regurgitation. No evidence of mitral stenosis.  4. The aortic valve is tricuspid. Aortic valve regurgitation is not  visualized.   Neuro/Psych  Headaches,    GI/Hepatic Neg liver ROS, GERD  Medicated,  Endo/Other  Hypothyroidism   Renal/GU negative Renal ROS     Musculoskeletal negative musculoskeletal ROS (+)   Abdominal   Peds  Hematology negative hematology ROS (+)   Anesthesia Other Findings   Reproductive/Obstetrics                            Anesthesia Physical Anesthesia Plan  ASA: 4  Anesthesia Plan:  General   Post-op Pain Management:    Induction: Intravenous  PONV Risk Score and Plan: 3 and Dexamethasone, Ondansetron and Midazolam  Airway Management Planned: Oral ETT  Additional Equipment: Arterial line, CVP, PA Cath, Ultrasound Guidance Line Placement and TEE  Intra-op Plan:   Post-operative Plan: Post-operative intubation/ventilation  Informed Consent: I have reviewed the patients History and Physical, chart, labs and discussed the procedure including the risks, benefits and alternatives for the proposed anesthesia with the patient or authorized representative who has indicated his/her understanding and acceptance.     Dental advisory given  Plan Discussed with: CRNA  Anesthesia Plan Comments:        Anesthesia Quick Evaluation

## 2021-04-05 NOTE — Progress Notes (Signed)
Pt still on covid precautions therefore called and discussed preop ed. Discussed IS, sternal precautions, mobility post op and d/c planning. Pt very receptive and had just read the [facility] booklet. She has been practicing IS, stating 1250 ml. Her husband will be with her at d/c. Her daughter is Char[employer] fundraiser and will check on her as well.  [id number] Ethelda Chick CES, ACSM 11:55 AM 04/05/2021

## 2021-04-06 ENCOUNTER — Inpatient Hospital Stay (HOSPITAL_COMMUNITY): Payer: Medicare Other

## 2021-04-06 ENCOUNTER — Inpatient Hospital Stay (HOSPITAL_COMMUNITY)
Admission: EM | Disposition: A | Discharge status: Home Health | Payer: Self-pay | Source: Home / Self Care | Attending: Cardiology

## 2021-04-06 ENCOUNTER — Encounter (HOSPITAL_COMMUNITY): Payer: Self-pay | Admitting: Cardiology

## 2021-04-06 ENCOUNTER — Inpatient Hospital Stay (HOSPITAL_COMMUNITY): Payer: Medicare Other | Admitting: Certified Registered Nurse Anesthetist

## 2021-04-06 DIAGNOSIS — Z951 Presence of aortocoronary bypass graft: Secondary | ICD-10-CM

## 2021-04-06 DIAGNOSIS — I2510 Atherosclerotic heart disease of native coronary artery without angina pectoris: Secondary | ICD-10-CM | POA: Diagnosis not present

## 2021-04-06 HISTORY — PX: CORONARY ARTERY BYPASS GRAFT: SHX141

## 2021-04-06 HISTORY — PX: ENDOVEIN HARVEST OF GREATER SAPHENOUS VEIN: SHX5059

## 2021-04-06 HISTORY — PX: TEE WITHOUT CARDIOVERSION: SHX5443

## 2021-04-06 LAB — POCT I-STAT 7, (LYTES, BLD GAS, ICA,H+H)
Acid-Base Excess: 2 mmol/L (ref 0.0–2.0)
Acid-Base Excess: 4 mmol/L — ABNORMAL HIGH (ref 0.0–2.0)
Acid-Base Excess: 2 mmol/L (ref 0.0–2.0)
Acid-Base Excess: 2 mmol/L (ref 0.0–2.0)
Acid-Base Excess: 1 mmol/L (ref 0.0–2.0)
Acid-Base Excess: 1 mmol/L (ref 0.0–2.0)
Acid-base deficit: 1 mmol/L (ref 0.0–2.0)
Acid-base deficit: 2 mmol/L (ref 0.0–2.0)
Acid-base deficit: 3 mmol/L — ABNORMAL HIGH (ref 0.0–2.0)
Acid-base deficit: 3 mmol/L — ABNORMAL HIGH (ref 0.0–2.0)
Bicarbonate: 26.7 mmol/L (ref 20.0–28.0)
Bicarbonate: 27.6 mmol/L (ref 20.0–28.0)
Bicarbonate: 25.6 mmol/L (ref 20.0–28.0)
Bicarbonate: 25.8 mmol/L (ref 20.0–28.0)
Bicarbonate: 25.5 mmol/L (ref 20.0–28.0)
Bicarbonate: 25.8 mmol/L (ref 20.0–28.0)
Bicarbonate: 24.1 mmol/L (ref 20.0–28.0)
Bicarbonate: 23.6 mmol/L (ref 20.0–28.0)
Bicarbonate: 23.9 mmol/L (ref 20.0–28.0)
Bicarbonate: 23.7 mmol/L (ref 20.0–28.0)
Calcium, Ion: 1.25 mmol/L (ref 1.15–1.40)
Calcium, Ion: 0.89 mmol/L — CL (ref 1.15–1.40)
Calcium, Ion: 0.89 mmol/L — CL (ref 1.15–1.40)
Calcium, Ion: 0.92 mmol/L — ABNORMAL LOW (ref 1.15–1.40)
Calcium, Ion: 0.94 mmol/L — ABNORMAL LOW (ref 1.15–1.40)
Calcium, Ion: 0.95 mmol/L — ABNORMAL LOW (ref 1.15–1.40)
Calcium, Ion: 1.16 mmol/L (ref 1.15–1.40)
Calcium, Ion: 1.16 mmol/L (ref 1.15–1.40)
Calcium, Ion: 1.22 mmol/L (ref 1.15–1.40)
Calcium, Ion: 1.21 mmol/L (ref 1.15–1.40)
HCT: 39 % (ref 36.0–46.0)
HCT: 24 % — ABNORMAL LOW (ref 36.0–46.0)
HCT: 21 % — ABNORMAL LOW (ref 36.0–46.0)
HCT: 29 % — ABNORMAL LOW (ref 36.0–46.0)
HCT: 28 % — ABNORMAL LOW (ref 36.0–46.0)
HCT: 28 % — ABNORMAL LOW (ref 36.0–46.0)
HCT: 29 % — ABNORMAL LOW (ref 36.0–46.0)
HCT: 31 % — ABNORMAL LOW (ref 36.0–46.0)
HCT: 29 % — ABNORMAL LOW (ref 36.0–46.0)
HCT: 29 % — ABNORMAL LOW (ref 36.0–46.0)
Hemoglobin: 13.3 g/dL (ref 12.0–15.0)
Hemoglobin: 8.2 g/dL — ABNORMAL LOW (ref 12.0–15.0)
Hemoglobin: 7.1 g/dL — ABNORMAL LOW (ref 12.0–15.0)
Hemoglobin: 9.9 g/dL — ABNORMAL LOW (ref 12.0–15.0)
Hemoglobin: 9.5 g/dL — ABNORMAL LOW (ref 12.0–15.0)
Hemoglobin: 9.5 g/dL — ABNORMAL LOW (ref 12.0–15.0)
Hemoglobin: 9.9 g/dL — ABNORMAL LOW (ref 12.0–15.0)
Hemoglobin: 10.5 g/dL — ABNORMAL LOW (ref 12.0–15.0)
Hemoglobin: 9.9 g/dL — ABNORMAL LOW (ref 12.0–15.0)
Hemoglobin: 9.9 g/dL — ABNORMAL LOW (ref 12.0–15.0)
O2 Saturation: 100 %
O2 Saturation: 100 %
O2 Saturation: 100 %
O2 Saturation: 100 %
O2 Saturation: 100 %
O2 Saturation: 100 %
O2 Saturation: 100 %
O2 Saturation: 100 %
O2 Saturation: 99 %
O2 Saturation: 99 %
Patient temperature: 35.7
Patient temperature: 35.9
Patient temperature: 36
Potassium: 3.8 mmol/L (ref 3.5–5.1)
Potassium: 3.9 mmol/L (ref 3.5–5.1)
Potassium: 4.6 mmol/L (ref 3.5–5.1)
Potassium: 4.7 mmol/L (ref 3.5–5.1)
Potassium: 4.4 mmol/L (ref 3.5–5.1)
Potassium: 4.4 mmol/L (ref 3.5–5.1)
Potassium: 4.2 mmol/L (ref 3.5–5.1)
Potassium: 4 mmol/L (ref 3.5–5.1)
Potassium: 3.9 mmol/L (ref 3.5–5.1)
Potassium: 4 mmol/L (ref 3.5–5.1)
Sodium: 139 mmol/L (ref 135–145)
Sodium: 139 mmol/L (ref 135–145)
Sodium: 135 mmol/L (ref 135–145)
Sodium: 136 mmol/L (ref 135–145)
Sodium: 136 mmol/L (ref 135–145)
Sodium: 138 mmol/L (ref 135–145)
Sodium: 139 mmol/L (ref 135–145)
Sodium: 140 mmol/L (ref 135–145)
Sodium: 139 mmol/L (ref 135–145)
Sodium: 139 mmol/L (ref 135–145)
TCO2: 28 mmol/L (ref 22–32)
TCO2: 29 mmol/L (ref 22–32)
TCO2: 27 mmol/L (ref 22–32)
TCO2: 27 mmol/L (ref 22–32)
TCO2: 27 mmol/L (ref 22–32)
TCO2: 27 mmol/L (ref 22–32)
TCO2: 25 mmol/L (ref 22–32)
TCO2: 25 mmol/L (ref 22–32)
TCO2: 25 mmol/L (ref 22–32)
TCO2: 25 mmol/L (ref 22–32)
pCO2 arterial: 42.4 mmHg (ref 32.0–48.0)
pCO2 arterial: 33.8 mmHg (ref 32.0–48.0)
pCO2 arterial: 33.9 mmHg (ref 32.0–48.0)
pCO2 arterial: 38.4 mmHg (ref 32.0–48.0)
pCO2 arterial: 40 mmHg (ref 32.0–48.0)
pCO2 arterial: 41.7 mmHg (ref 32.0–48.0)
pCO2 arterial: 39.1 mmHg (ref 32.0–48.0)
pCO2 arterial: 39 mmHg (ref 32.0–48.0)
pCO2 arterial: 47.3 mmHg (ref 32.0–48.0)
pCO2 arterial: 47.5 mmHg (ref 32.0–48.0)
pH, Arterial: 7.406 (ref 7.350–7.450)
pH, Arterial: 7.519 — ABNORMAL HIGH (ref 7.350–7.450)
pH, Arterial: 7.435 (ref 7.350–7.450)
pH, Arterial: 7.487 — ABNORMAL HIGH (ref 7.350–7.450)
pH, Arterial: 7.399 (ref 7.350–7.450)
pH, Arterial: 7.413 (ref 7.350–7.450)
pH, Arterial: 7.397 (ref 7.350–7.450)
pH, Arterial: 7.385 (ref 7.350–7.450)
pH, Arterial: 7.307 — ABNORMAL LOW (ref 7.350–7.450)
pH, Arterial: 7.301 — ABNORMAL LOW (ref 7.350–7.450)
pO2, Arterial: 376 mmHg — ABNORMAL HIGH (ref 83.0–108.0)
pO2, Arterial: 397 mmHg — ABNORMAL HIGH (ref 83.0–108.0)
pO2, Arterial: 432 mmHg — ABNORMAL HIGH (ref 83.0–108.0)
pO2, Arterial: 440 mmHg — ABNORMAL HIGH (ref 83.0–108.0)
pO2, Arterial: 328 mmHg — ABNORMAL HIGH (ref 83.0–108.0)
pO2, Arterial: 365 mmHg — ABNORMAL HIGH (ref 83.0–108.0)
pO2, Arterial: 403 mmHg — ABNORMAL HIGH (ref 83.0–108.0)
pO2, Arterial: 169 mmHg — ABNORMAL HIGH (ref 83.0–108.0)
pO2, Arterial: 138 mmHg — ABNORMAL HIGH (ref 83.0–108.0)
pO2, Arterial: 142 mmHg — ABNORMAL HIGH (ref 83.0–108.0)

## 2021-04-06 LAB — URINALYSIS, ROUTINE W REFLEX MICROSCOPIC
Bilirubin Urine: NEGATIVE
Glucose, UA: >=500 mg/dL — AB
Hgb urine dipstick: NEGATIVE
Ketones, ur: NEGATIVE mg/dL
Leukocytes,Ua: NEGATIVE
Nitrite: NEGATIVE
Protein, ur: NEGATIVE mg/dL
Specific Gravity, Urine: 1.015 (ref 1.005–1.030)
pH: 6.5 (ref 5.0–8.0)

## 2021-04-06 LAB — POCT I-STAT, CHEM 8
BUN: 18 mg/dL (ref 8–23)
BUN: 17 mg/dL (ref 8–23)
BUN: 14 mg/dL (ref 8–23)
BUN: 14 mg/dL (ref 8–23)
BUN: 14 mg/dL (ref 8–23)
Calcium, Ion: 1.25 mmol/L (ref 1.15–1.40)
Calcium, Ion: 1.23 mmol/L (ref 1.15–1.40)
Calcium, Ion: 0.89 mmol/L — CL (ref 1.15–1.40)
Calcium, Ion: 0.94 mmol/L — ABNORMAL LOW (ref 1.15–1.40)
Calcium, Ion: 1.22 mmol/L (ref 1.15–1.40)
Chloride: 101 mmol/L (ref 98–111)
Chloride: 102 mmol/L (ref 98–111)
Chloride: 98 mmol/L (ref 98–111)
Chloride: 99 mmol/L (ref 98–111)
Chloride: 102 mmol/L (ref 98–111)
Creatinine, Ser: 0.8 mg/dL (ref 0.44–1.00)
Creatinine, Ser: 0.8 mg/dL (ref 0.44–1.00)
Creatinine, Ser: 0.5 mg/dL (ref 0.44–1.00)
Creatinine, Ser: 0.7 mg/dL (ref 0.44–1.00)
Creatinine, Ser: 0.6 mg/dL (ref 0.44–1.00)
Glucose, Bld: 139 mg/dL — ABNORMAL HIGH (ref 70–99)
Glucose, Bld: 136 mg/dL — ABNORMAL HIGH (ref 70–99)
Glucose, Bld: 116 mg/dL — ABNORMAL HIGH (ref 70–99)
Glucose, Bld: 164 mg/dL — ABNORMAL HIGH (ref 70–99)
Glucose, Bld: 185 mg/dL — ABNORMAL HIGH (ref 70–99)
HCT: 38 % (ref 36.0–46.0)
HCT: 34 % — ABNORMAL LOW (ref 36.0–46.0)
HCT: 20 % — ABNORMAL LOW (ref 36.0–46.0)
HCT: 28 % — ABNORMAL LOW (ref 36.0–46.0)
HCT: 28 % — ABNORMAL LOW (ref 36.0–46.0)
Hemoglobin: 12.9 g/dL (ref 12.0–15.0)
Hemoglobin: 11.6 g/dL — ABNORMAL LOW (ref 12.0–15.0)
Hemoglobin: 6.8 g/dL — CL (ref 12.0–15.0)
Hemoglobin: 9.5 g/dL — ABNORMAL LOW (ref 12.0–15.0)
Hemoglobin: 9.5 g/dL — ABNORMAL LOW (ref 12.0–15.0)
Potassium: 3.8 mmol/L (ref 3.5–5.1)
Potassium: 3.8 mmol/L (ref 3.5–5.1)
Potassium: 4.7 mmol/L (ref 3.5–5.1)
Potassium: 4.5 mmol/L (ref 3.5–5.1)
Potassium: 4.3 mmol/L (ref 3.5–5.1)
Sodium: 139 mmol/L (ref 135–145)
Sodium: 137 mmol/L (ref 135–145)
Sodium: 134 mmol/L — ABNORMAL LOW (ref 135–145)
Sodium: 137 mmol/L (ref 135–145)
Sodium: 139 mmol/L (ref 135–145)
TCO2: 27 mmol/L (ref 22–32)
TCO2: 24 mmol/L (ref 22–32)
TCO2: 26 mmol/L (ref 22–32)
TCO2: 25 mmol/L (ref 22–32)
TCO2: 25 mmol/L (ref 22–32)

## 2021-04-06 LAB — CBC
HCT: 42.1 % (ref 36.0–46.0)
HCT: 30.6 % — ABNORMAL LOW (ref 36.0–46.0)
HCT: 30.5 % — ABNORMAL LOW (ref 36.0–46.0)
Hemoglobin: 13.7 g/dL (ref 12.0–15.0)
Hemoglobin: 10.3 g/dL — ABNORMAL LOW (ref 12.0–15.0)
Hemoglobin: 10.6 g/dL — ABNORMAL LOW (ref 12.0–15.0)
MCH: 28.4 pg (ref 26.0–34.0)
MCH: 29.8 pg (ref 26.0–34.0)
MCH: 30.5 pg (ref 26.0–34.0)
MCHC: 32.5 g/dL (ref 30.0–36.0)
MCHC: 33.7 g/dL (ref 30.0–36.0)
MCHC: 34.8 g/dL (ref 30.0–36.0)
MCV: 87.2 fL (ref 80.0–100.0)
MCV: 88.4 fL (ref 80.0–100.0)
MCV: 87.9 fL (ref 80.0–100.0)
Platelets: 248 10*3/uL (ref 150–400)
Platelets: 107 10*3/uL — ABNORMAL LOW (ref 150–400)
Platelets: 102 10*3/uL — ABNORMAL LOW (ref 150–400)
RBC: 4.83 MIL/uL (ref 3.87–5.11)
RBC: 3.46 MIL/uL — ABNORMAL LOW (ref 3.87–5.11)
RBC: 3.47 MIL/uL — ABNORMAL LOW (ref 3.87–5.11)
RDW: 14.1 % (ref 11.5–15.5)
RDW: 13.9 % (ref 11.5–15.5)
RDW: 14 % (ref 11.5–15.5)
WBC: 13.6 10*3/uL — ABNORMAL HIGH (ref 4.0–10.5)
WBC: 23 10*3/uL — ABNORMAL HIGH (ref 4.0–10.5)
WBC: 16.2 10*3/uL — ABNORMAL HIGH (ref 4.0–10.5)
nRBC: 0 % (ref 0.0–0.2)
nRBC: 0 % (ref 0.0–0.2)
nRBC: 0 % (ref 0.0–0.2)

## 2021-04-06 LAB — URINALYSIS, MICROSCOPIC (REFLEX)

## 2021-04-06 LAB — BASIC METABOLIC PANEL
Anion gap: 14 (ref 5–15)
Anion gap: 7 (ref 5–15)
BUN: 22 mg/dL (ref 8–23)
BUN: 13 mg/dL (ref 8–23)
CO2: 21 mmol/L — ABNORMAL LOW (ref 22–32)
CO2: 22 mmol/L (ref 22–32)
Calcium: 9.6 mg/dL (ref 8.9–10.3)
Calcium: 8 mg/dL — ABNORMAL LOW (ref 8.9–10.3)
Chloride: 102 mmol/L (ref 98–111)
Chloride: 107 mmol/L (ref 98–111)
Creatinine, Ser: 1.22 mg/dL — ABNORMAL HIGH (ref 0.44–1.00)
Creatinine, Ser: 0.77 mg/dL (ref 0.44–1.00)
GFR, Estimated: 48 mL/min — ABNORMAL LOW (ref >60)
GFR, Estimated: >60 mL/min (ref >60)
Glucose, Bld: 162 mg/dL — ABNORMAL HIGH (ref 70–99)
Glucose, Bld: 142 mg/dL — ABNORMAL HIGH (ref 70–99)
Potassium: 3.6 mmol/L (ref 3.5–5.1)
Potassium: 4.3 mmol/L (ref 3.5–5.1)
Sodium: 137 mmol/L (ref 135–145)
Sodium: 136 mmol/L (ref 135–145)

## 2021-04-06 LAB — ECHO INTRAOPERATIVE TEE
Height: 63 in
Weight: 2606.72 oz

## 2021-04-06 LAB — MRSA NEXT GEN BY PCR, NASAL: MRSA by PCR Next Gen: NOT DETECTED

## 2021-04-06 LAB — PROTIME-INR
INR: 1.3 — ABNORMAL HIGH (ref 0.8–1.2)
Prothrombin Time: 15.9 seconds — ABNORMAL HIGH (ref 11.4–15.2)

## 2021-04-06 LAB — GLUCOSE, CAPILLARY
Glucose-Capillary: 139 mg/dL — ABNORMAL HIGH (ref 70–99)
Glucose-Capillary: 139 mg/dL — ABNORMAL HIGH (ref 70–99)
Glucose-Capillary: 151 mg/dL — ABNORMAL HIGH (ref 70–99)
Glucose-Capillary: 152 mg/dL — ABNORMAL HIGH (ref 70–99)
Glucose-Capillary: 157 mg/dL — ABNORMAL HIGH (ref 70–99)
Glucose-Capillary: 158 mg/dL — ABNORMAL HIGH (ref 70–99)
Glucose-Capillary: 167 mg/dL — ABNORMAL HIGH (ref 70–99)
Glucose-Capillary: 169 mg/dL — ABNORMAL HIGH (ref 70–99)
Glucose-Capillary: 176 mg/dL — ABNORMAL HIGH (ref 70–99)

## 2021-04-06 LAB — BLOOD GAS, ARTERIAL
Acid-Base Excess: 0.4 mmol/L (ref 0.0–2.0)
Bicarbonate: 24.9 mmol/L (ref 20.0–28.0)
Drawn by: 82388
FIO2: 21
O2 Saturation: 94.6 %
Patient temperature: 36.8
pCO2 arterial: 42.2 mmHg (ref 32.0–48.0)
pH, Arterial: 7.387 (ref 7.350–7.450)
pO2, Arterial: 78.2 mmHg — ABNORMAL LOW (ref 83.0–108.0)

## 2021-04-06 LAB — POCT I-STAT EG7
Acid-Base Excess: 2 mmol/L (ref 0.0–2.0)
Bicarbonate: 26.6 mmol/L (ref 20.0–28.0)
Calcium, Ion: 0.95 mmol/L — ABNORMAL LOW (ref 1.15–1.40)
HCT: 24 % — ABNORMAL LOW (ref 36.0–46.0)
Hemoglobin: 8.2 g/dL — ABNORMAL LOW (ref 12.0–15.0)
O2 Saturation: 82 %
Potassium: 3.4 mmol/L — ABNORMAL LOW (ref 3.5–5.1)
Sodium: 142 mmol/L (ref 135–145)
TCO2: 28 mmol/L (ref 22–32)
pCO2, Ven: 37.9 mmHg — ABNORMAL LOW (ref 44.0–60.0)
pH, Ven: 7.454 — ABNORMAL HIGH (ref 7.250–7.430)
pO2, Ven: 44 mmHg (ref 32.0–45.0)

## 2021-04-06 LAB — PLATELET COUNT: Platelets: 165 10*3/uL (ref 150–400)

## 2021-04-06 LAB — APTT
aPTT: 30 seconds (ref 24–36)
aPTT: 29 seconds (ref 24–36)

## 2021-04-06 LAB — HEMOGLOBIN AND HEMATOCRIT, BLOOD
HCT: 28.7 % — ABNORMAL LOW (ref 36.0–46.0)
Hemoglobin: 10.3 g/dL — ABNORMAL LOW (ref 12.0–15.0)

## 2021-04-06 LAB — PREPARE RBC (CROSSMATCH)

## 2021-04-06 LAB — HEPARIN LEVEL (UNFRACTIONATED): Heparin Unfractionated: 0.52 IU/mL (ref 0.30–0.70)

## 2021-04-06 IMAGING — DX DG CHEST 1V PORT
1 series · 1 of 1 positions shown · non-contrast
Comparison: Portable exam [PP] hours compared to [DATE]

CLINICAL DATA: Post CABG

EXAM:
PORTABLE CHEST 1 VIEW

[chest]
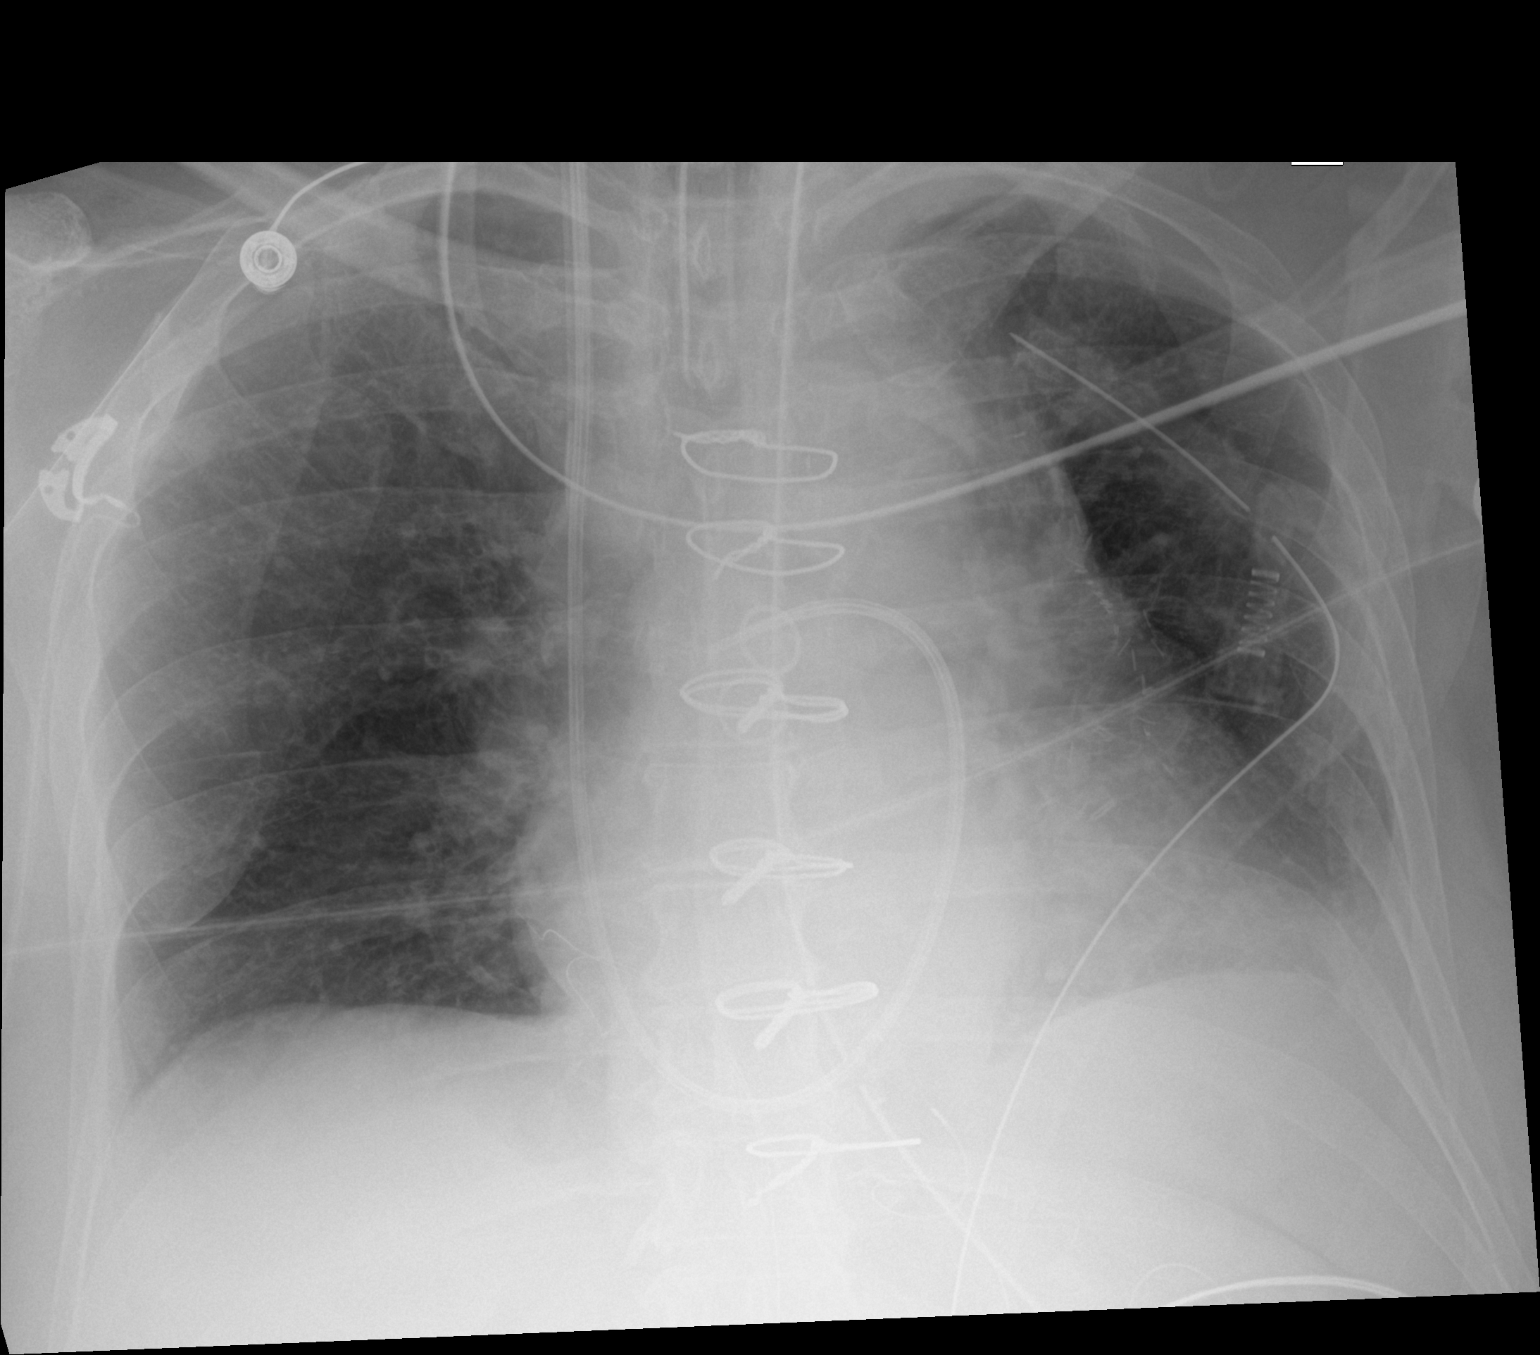

[1 of 1 positions shown; findings below may reference images not displayed]

FINDINGS: Tip of endotracheal tube projects 2.2 cm above carina.

Nasogastric tube extends into stomach.

Mediastinal drain and LEFT thoracostomy tube present.

Epicardial pacing wires noted.

RIGHT jugular Swan-Ganz catheter tip projects over RIGHT pulmonary
artery.

Prominent cardiac silhouette post CABG.

Minimal LEFT basilar atelectasis.

No acute infiltrate or pneumothorax.
IMPRESSION: Postoperative changes as above.

## 2021-04-06 SURGERY — CORONARY ARTERY BYPASS GRAFTING (CABG)
Anesthesia: General | Site: Chest | Laterality: Right

## 2021-04-06 MED ORDER — FENTANYL CITRATE (PF) 250 MCG/5ML IJ SOLN
INTRAMUSCULAR | Status: AC
  Filled 2021-04-06: qty 5

## 2021-04-06 MED ORDER — LACTATED RINGERS IV SOLN: INTRAVENOUS | Status: DC

## 2021-04-06 MED ORDER — FAMOTIDINE IN NACL 20-0.9 MG/50ML-% IV SOLN
20.0000 mg | Freq: Two times a day (BID) | INTRAVENOUS | Status: DC
  Administered 2021-04-06: 20 mg via INTRAVENOUS
  Filled 2021-04-06: qty 50

## 2021-04-06 MED ORDER — BISACODYL 10 MG RE SUPP: 10.0000 mg | Freq: Every day | RECTAL | Status: DC

## 2021-04-06 MED ORDER — PLASMA-LYTE A IV SOLN
INTRAVENOUS | Status: DC | PRN
  Administered 2021-04-06: 1000 mL

## 2021-04-06 MED ORDER — DEXMEDETOMIDINE HCL IN NACL 400 MCG/100ML IV SOLN
0.0000 ug/kg/h | INTRAVENOUS | Status: DC
  Administered 2021-04-07: 0.1 ug/kg/h via INTRAVENOUS
  Filled 2021-04-06: qty 100

## 2021-04-06 MED ORDER — INSULIN REGULAR(HUMAN) IN NACL 100-0.9 UT/100ML-% IV SOLN: INTRAVENOUS | Status: DC

## 2021-04-06 MED ORDER — PHENYLEPHRINE 40 MCG/ML (10ML) SYRINGE FOR IV PUSH (FOR BLOOD PRESSURE SUPPORT)
PREFILLED_SYRINGE | INTRAVENOUS | Status: AC
  Filled 2021-04-06: qty 10

## 2021-04-06 MED ORDER — LACTATED RINGERS IV SOLN: 500.0000 mL | Freq: Once | INTRAVENOUS | Status: DC | PRN

## 2021-04-06 MED ORDER — ACETAMINOPHEN 500 MG PO TABS
1000.0000 mg | ORAL_TABLET | Freq: Four times a day (QID) | ORAL | Status: AC
  Administered 2021-04-06 – 2021-04-11 (×19): 1000 mg via ORAL
  Filled 2021-04-06 (×17): qty 2

## 2021-04-06 MED ORDER — NITROGLYCERIN 0.2 MG/ML ON CALL CATH LAB
INTRAVENOUS | Status: DC | PRN
  Administered 2021-04-06: 20 ug via INTRAVENOUS

## 2021-04-06 MED ORDER — OXYCODONE HCL 5 MG PO TABS
5.0000 mg | ORAL_TABLET | ORAL | Status: DC | PRN
  Administered 2021-04-06 – 2021-04-09 (×12): 10 mg via ORAL
  Filled 2021-04-06 (×12): qty 2

## 2021-04-06 MED ORDER — ACETAMINOPHEN 160 MG/5ML PO SOLN: 1000.0000 mg | Freq: Four times a day (QID) | ORAL | Status: AC

## 2021-04-06 MED ORDER — SODIUM CHLORIDE 0.9 % IV SOLN
250.0000 mL | INTRAVENOUS | Status: DC
  Administered 2021-04-07: 250 mL via INTRAVENOUS

## 2021-04-06 MED ORDER — SODIUM CHLORIDE 0.9% FLUSH: 3.0000 mL | INTRAVENOUS | Status: DC | PRN

## 2021-04-06 MED ORDER — METOPROLOL TARTRATE 25 MG/10 ML ORAL SUSPENSION: 12.5000 mg | Freq: Two times a day (BID) | ORAL | Status: DC

## 2021-04-06 MED ORDER — POTASSIUM CHLORIDE 10 MEQ/50ML IV SOLN: 10.0000 meq | INTRAVENOUS | Status: AC

## 2021-04-06 MED ORDER — PROPOFOL 10 MG/ML IV BOLUS
INTRAVENOUS | Status: DC | PRN
Start: 2021-04-06 — End: 2021-04-06
  Administered 2021-04-06 (×2): 30 mg via INTRAVENOUS
  Administered 2021-04-06: 50 mg via INTRAVENOUS
  Administered 2021-04-06: 30 mg via INTRAVENOUS

## 2021-04-06 MED ORDER — ROCURONIUM BROMIDE 10 MG/ML (PF) SYRINGE
PREFILLED_SYRINGE | INTRAVENOUS | Status: AC
  Filled 2021-04-06: qty 20

## 2021-04-06 MED ORDER — DEXTROSE 50 % IV SOLN: 0.0000 mL | INTRAVENOUS | Status: DC | PRN

## 2021-04-06 MED ORDER — CEFAZOLIN SODIUM-DEXTROSE 2-4 GM/100ML-% IV SOLN
2.0000 g | Freq: Three times a day (TID) | INTRAVENOUS | Status: AC
  Administered 2021-04-06 – 2021-04-08 (×6): 2 g via INTRAVENOUS
  Filled 2021-04-06 (×5): qty 100

## 2021-04-06 MED ORDER — BISACODYL 5 MG PO TBEC
10.0000 mg | DELAYED_RELEASE_TABLET | Freq: Every day | ORAL | Status: DC
  Administered 2021-04-07 – 2021-04-12 (×4): 10 mg via ORAL
  Filled 2021-04-06 (×4): qty 2

## 2021-04-06 MED ORDER — VANCOMYCIN HCL IN DEXTROSE 1-5 GM/200ML-% IV SOLN
1000.0000 mg | Freq: Once | INTRAVENOUS | Status: AC
  Administered 2021-04-06: 1000 mg via INTRAVENOUS
  Filled 2021-04-06: qty 200

## 2021-04-06 MED ORDER — DEXAMETHASONE SODIUM PHOSPHATE 10 MG/ML IJ SOLN
INTRAMUSCULAR | Status: AC
  Filled 2021-04-06: qty 1

## 2021-04-06 MED ORDER — ORAL CARE MOUTH RINSE
15.0000 mL | Freq: Two times a day (BID) | OROMUCOSAL | Status: DC
  Administered 2021-04-06 – 2021-04-12 (×7): 15 mL via OROMUCOSAL

## 2021-04-06 MED ORDER — PHENYLEPHRINE HCL-NACL 20-0.9 MG/250ML-% IV SOLN: 0.0000 ug/min | INTRAVENOUS | Status: DC

## 2021-04-06 MED ORDER — METOPROLOL TARTRATE 12.5 MG HALF TABLET
12.5000 mg | ORAL_TABLET | Freq: Two times a day (BID) | ORAL | Status: DC
  Administered 2021-04-06 – 2021-04-11 (×11): 12.5 mg via ORAL
  Filled 2021-04-06 (×11): qty 1

## 2021-04-06 MED ORDER — PHENYLEPHRINE 40 MCG/ML (10ML) SYRINGE FOR IV PUSH (FOR BLOOD PRESSURE SUPPORT)
PREFILLED_SYRINGE | INTRAVENOUS | Status: DC | PRN
  Administered 2021-04-06: 40 ug via INTRAVENOUS
  Administered 2021-04-06 (×5): 80 ug via INTRAVENOUS
  Administered 2021-04-06: 120 ug via INTRAVENOUS

## 2021-04-06 MED ORDER — MIDAZOLAM HCL (PF) 5 MG/ML IJ SOLN
INTRAMUSCULAR | Status: DC | PRN
  Administered 2021-04-06: 2 mg via INTRAVENOUS
  Administered 2021-04-06: 1 mg via INTRAVENOUS
  Administered 2021-04-06 (×2): 2 mg via INTRAVENOUS
  Administered 2021-04-06: 3 mg via INTRAVENOUS

## 2021-04-06 MED ORDER — ASPIRIN 81 MG PO CHEW
324.0000 mg | CHEWABLE_TABLET | Freq: Every day | ORAL | Status: DC
  Filled 2021-04-06: qty 4

## 2021-04-06 MED ORDER — PROTAMINE SULFATE 10 MG/ML IV SOLN
INTRAVENOUS | Status: AC
  Filled 2021-04-06: qty 25

## 2021-04-06 MED ORDER — ACETAMINOPHEN 650 MG RE SUPP
650.0000 mg | Freq: Once | RECTAL | Status: AC
  Administered 2021-04-06: 650 mg via RECTAL

## 2021-04-06 MED ORDER — SODIUM CHLORIDE 0.9 % IV SOLN: INTRAVENOUS | Status: DC | PRN

## 2021-04-06 MED ORDER — HEMOSTATIC AGENTS (NO CHARGE) OPTIME
TOPICAL | Status: DC | PRN
  Administered 2021-04-06 (×2): 1 via TOPICAL

## 2021-04-06 MED ORDER — NOREPINEPHRINE 4 MG/250ML-% IV SOLN
0.0000 ug/min | INTRAVENOUS | Status: AC
  Administered 2021-04-06: 4 ug/min via INTRAVENOUS

## 2021-04-06 MED ORDER — ALBUMIN HUMAN 5 % IV SOLN: INTRAVENOUS | Status: DC | PRN

## 2021-04-06 MED ORDER — PROTAMINE SULFATE 10 MG/ML IV SOLN
INTRAVENOUS | Status: DC | PRN
Start: 2021-04-06 — End: 2021-04-06
  Administered 2021-04-06: 260 mg via INTRAVENOUS

## 2021-04-06 MED ORDER — METOPROLOL TARTRATE 5 MG/5ML IV SOLN
2.5000 mg | INTRAVENOUS | Status: DC | PRN
  Administered 2021-04-10: 5 mg via INTRAVENOUS
  Filled 2021-04-06: qty 5

## 2021-04-06 MED ORDER — NITROGLYCERIN IN D5W 200-5 MCG/ML-% IV SOLN: 0.0000 ug/min | INTRAVENOUS | Status: DC

## 2021-04-06 MED ORDER — PANTOPRAZOLE SODIUM 40 MG PO TBEC
40.0000 mg | DELAYED_RELEASE_TABLET | Freq: Every day | ORAL | Status: DC
  Administered 2021-04-08 – 2021-04-12 (×5): 40 mg via ORAL
  Filled 2021-04-06 (×4): qty 1

## 2021-04-06 MED ORDER — PROPOFOL 10 MG/ML IV BOLUS
INTRAVENOUS | Status: AC
  Filled 2021-04-06: qty 20

## 2021-04-06 MED ORDER — SODIUM CHLORIDE 0.45 % IV SOLN: INTRAVENOUS | Status: DC | PRN

## 2021-04-06 MED ORDER — MILRINONE LACTATE IN DEXTROSE 20-5 MG/100ML-% IV SOLN
0.2500 ug/kg/min | INTRAVENOUS | Status: DC
  Administered 2021-04-06 – 2021-04-07 (×2): 0.25 ug/kg/min via INTRAVENOUS
  Filled 2021-04-06: qty 100

## 2021-04-06 MED ORDER — FENTANYL CITRATE (PF) 250 MCG/5ML IJ SOLN
INTRAMUSCULAR | Status: DC | PRN
  Administered 2021-04-06: 50 ug via INTRAVENOUS
  Administered 2021-04-06: 100 ug via INTRAVENOUS
  Administered 2021-04-06: 50 ug via INTRAVENOUS
  Administered 2021-04-06: 100 ug via INTRAVENOUS
  Administered 2021-04-06: 50 ug via INTRAVENOUS
  Administered 2021-04-06: 100 ug via INTRAVENOUS
  Administered 2021-04-06: 50 ug via INTRAVENOUS
  Administered 2021-04-06: 150 ug via INTRAVENOUS
  Administered 2021-04-06: 100 ug via INTRAVENOUS
  Administered 2021-04-06: 150 ug via INTRAVENOUS
  Administered 2021-04-06: 100 ug via INTRAVENOUS
  Administered 2021-04-06: 150 ug via INTRAVENOUS
  Administered 2021-04-06: 100 ug via INTRAVENOUS

## 2021-04-06 MED ORDER — HEPARIN SODIUM (PORCINE) 1000 UNIT/ML IJ SOLN
INTRAMUSCULAR | Status: AC
  Filled 2021-04-06: qty 1

## 2021-04-06 MED ORDER — SURGIFLO WITH THROMBIN (HEMOSTATIC MATRIX KIT) OPTIME
TOPICAL | Status: DC | PRN
  Administered 2021-04-06: 1 via TOPICAL

## 2021-04-06 MED ORDER — TRAMADOL HCL 50 MG PO TABS
50.0000 mg | ORAL_TABLET | ORAL | Status: DC | PRN
  Administered 2021-04-06: 50 mg via ORAL
  Administered 2021-04-07 (×2): 100 mg via ORAL
  Administered 2021-04-07: 50 mg via ORAL
  Administered 2021-04-08 – 2021-04-09 (×2): 100 mg via ORAL
  Filled 2021-04-06: qty 2
  Filled 2021-04-06 (×2): qty 1
  Filled 2021-04-06 (×3): qty 2

## 2021-04-06 MED ORDER — HEPARIN SODIUM (PORCINE) 1000 UNIT/ML IJ SOLN
INTRAMUSCULAR | Status: DC | PRN
  Administered 2021-04-06: 23000 [IU] via INTRAVENOUS
  Administered 2021-04-06: 3000 [IU] via INTRAVENOUS

## 2021-04-06 MED ORDER — ALBUMIN HUMAN 5 % IV SOLN
250.0000 mL | INTRAVENOUS | Status: AC | PRN
  Administered 2021-04-06 (×3): 12.5 g via INTRAVENOUS
  Filled 2021-04-06: qty 250

## 2021-04-06 MED ORDER — MIDAZOLAM HCL (PF) 10 MG/2ML IJ SOLN
INTRAMUSCULAR | Status: AC
  Filled 2021-04-06: qty 2

## 2021-04-06 MED ORDER — CHLORHEXIDINE GLUCONATE 0.12 % MT SOLN
15.0000 mL | OROMUCOSAL | Status: AC
  Administered 2021-04-06: 15 mL via OROMUCOSAL

## 2021-04-06 MED ORDER — ONDANSETRON HCL 4 MG/2ML IJ SOLN
4.0000 mg | Freq: Four times a day (QID) | INTRAMUSCULAR | Status: DC | PRN
  Administered 2021-04-07 – 2021-04-10 (×3): 4 mg via INTRAVENOUS
  Filled 2021-04-06 (×3): qty 2

## 2021-04-06 MED ORDER — MIDAZOLAM HCL 2 MG/2ML IJ SOLN: 2.0000 mg | INTRAMUSCULAR | Status: DC | PRN

## 2021-04-06 MED ORDER — NOREPINEPHRINE 4 MG/250ML-% IV SOLN: 0.0000 ug/min | INTRAVENOUS | Status: DC

## 2021-04-06 MED ORDER — MILRINONE LACTATE IN DEXTROSE 20-5 MG/100ML-% IV SOLN
0.2500 ug/kg/min | INTRAVENOUS | Status: DC
  Administered 2021-04-06: 0.25 ug/kg/min via INTRAVENOUS

## 2021-04-06 MED ORDER — LACTATED RINGERS IV SOLN: INTRAVENOUS | Status: DC | PRN

## 2021-04-06 MED ORDER — MAGNESIUM SULFATE 4 GM/100ML IV SOLN
4.0000 g | Freq: Once | INTRAVENOUS | Status: AC
  Administered 2021-04-06: 4 g via INTRAVENOUS
  Filled 2021-04-06: qty 100

## 2021-04-06 MED ORDER — SODIUM CHLORIDE 0.9 % IV SOLN
20.0000 ug | Freq: Once | INTRAVENOUS | Status: AC
  Administered 2021-04-06: 20 ug via INTRAVENOUS
  Filled 2021-04-06: qty 5

## 2021-04-06 MED ORDER — PROTAMINE SULFATE 10 MG/ML IV SOLN
INTRAVENOUS | Status: AC
  Filled 2021-04-06: qty 5

## 2021-04-06 MED ORDER — NOREPINEPHRINE BITARTRATE 1 MG/ML IV SOLN
INTRAVENOUS | Status: DC | PRN
  Administered 2021-04-06 (×2): .5 mL via INTRAVENOUS

## 2021-04-06 MED ORDER — ROCURONIUM BROMIDE 10 MG/ML (PF) SYRINGE
PREFILLED_SYRINGE | INTRAVENOUS | Status: DC | PRN
  Administered 2021-04-06: 100 mg via INTRAVENOUS
  Administered 2021-04-06 (×2): 50 mg via INTRAVENOUS

## 2021-04-06 MED ORDER — CHLORHEXIDINE GLUCONATE CLOTH 2 % EX PADS
6.0000 | MEDICATED_PAD | Freq: Every day | CUTANEOUS | Status: DC
  Administered 2021-04-06 – 2021-04-08 (×3): 6 via TOPICAL

## 2021-04-06 MED ORDER — SODIUM CHLORIDE 0.9% FLUSH
3.0000 mL | Freq: Two times a day (BID) | INTRAVENOUS | Status: DC
  Administered 2021-04-07 – 2021-04-12 (×5): 3 mL via INTRAVENOUS

## 2021-04-06 MED ORDER — ACETAMINOPHEN 160 MG/5ML PO SOLN: 650.0000 mg | Freq: Once | ORAL | Status: AC

## 2021-04-06 MED ORDER — SODIUM CHLORIDE 0.9 % IV SOLN: INTRAVENOUS | Status: DC

## 2021-04-06 MED ORDER — FENTANYL CITRATE PF 50 MCG/ML IJ SOSY
50.0000 ug | PREFILLED_SYRINGE | INTRAMUSCULAR | Status: DC | PRN
  Administered 2021-04-06 – 2021-04-07 (×5): 50 ug via INTRAVENOUS
  Filled 2021-04-06 (×5): qty 1

## 2021-04-06 MED ORDER — 0.9 % SODIUM CHLORIDE (POUR BTL) OPTIME
TOPICAL | Status: DC | PRN
  Administered 2021-04-06: 1000 mL
  Administered 2021-04-06: 5000 mL

## 2021-04-06 MED ORDER — SODIUM CHLORIDE (PF) 0.9 % IJ SOLN
OROMUCOSAL | Status: DC | PRN
  Administered 2021-04-06 (×3): 4 mL via TOPICAL

## 2021-04-06 MED ORDER — ASPIRIN EC 325 MG PO TBEC
325.0000 mg | DELAYED_RELEASE_TABLET | Freq: Every day | ORAL | Status: DC
  Administered 2021-04-07 – 2021-04-12 (×6): 325 mg via ORAL
  Filled 2021-04-06 (×6): qty 1

## 2021-04-06 MED ORDER — DOCUSATE SODIUM 100 MG PO CAPS
200.0000 mg | ORAL_CAPSULE | Freq: Every day | ORAL | Status: DC
  Administered 2021-04-07 – 2021-04-12 (×4): 200 mg via ORAL
  Filled 2021-04-06 (×4): qty 2

## 2021-04-06 MED ORDER — LACTATED RINGERS IV SOLN
INTRAVENOUS | Status: DC | PRN
Start: 2021-04-06 — End: 2021-04-06

## 2021-04-06 SURGICAL SUPPLY — 99 items
ADAPTER CARDIO PERF ANTE/RETRO (ADAPTER) ×5 IMPLANT
BAG DECANTER FOR FLEXI CONT (MISCELLANEOUS) ×5 IMPLANT
BLADE CLIPPER SURG (BLADE) ×5 IMPLANT
BLADE STERNUM SYSTEM 6 (BLADE) ×5 IMPLANT
BLADE SURG 12 STRL SS (BLADE) ×5 IMPLANT
BNDG ELASTIC 4X5.8 VLCR STR LF (GAUZE/BANDAGES/DRESSINGS) ×5 IMPLANT
BNDG ELASTIC 6X5.8 VLCR STR LF (GAUZE/BANDAGES/DRESSINGS) ×5 IMPLANT
BNDG GAUZE ELAST 4 BULKY (GAUZE/BANDAGES/DRESSINGS) ×5 IMPLANT
CANISTER SUCT 3000ML PPV (MISCELLANEOUS) ×5 IMPLANT
CANNULA GUNDRY RCSP 15FR (MISCELLANEOUS) ×5 IMPLANT
CATH CPB KIT VANTRIGT (MISCELLANEOUS) ×5 IMPLANT
CATH ROBINSON RED A/P 18FR (CATHETERS) ×15 IMPLANT
CATH THORACIC 28FR RT ANG (CATHETERS) ×5 IMPLANT
CLIP RETRACTION 3.0MM CORONARY (MISCELLANEOUS) ×1 IMPLANT
CLIP TI WIDE RED SMALL 24 (CLIP) ×1 IMPLANT
DERMABOND ADVANCED (GAUZE/BANDAGES/DRESSINGS) ×1
DERMABOND ADVANCED .7 DNX12 (GAUZE/BANDAGES/DRESSINGS) IMPLANT
DRAIN CHANNEL 32F RND 10.7 FF (WOUND CARE) ×5 IMPLANT
DRAPE CARDIOVASCULAR INCISE (DRAPES) ×5
DRAPE SLUSH/WARMER DISC (DRAPES) ×5 IMPLANT
DRAPE SRG 135X102X78XABS (DRAPES) ×4 IMPLANT
DRSG AQUACEL AG ADV 3.5X14 (GAUZE/BANDAGES/DRESSINGS) ×5 IMPLANT
ELECT BLADE 4.0 EZ CLEAN MEGAD (MISCELLANEOUS) ×5
ELECT BLADE 6.5 EXT (BLADE) ×5 IMPLANT
ELECT CAUTERY BLADE 6.4 (BLADE) ×5 IMPLANT
ELECT REM PT RETURN 9FT ADLT (ELECTROSURGICAL) ×10
ELECTRODE BLDE 4.0 EZ CLN MEGD (MISCELLANEOUS) ×4 IMPLANT
ELECTRODE REM PT RTRN 9FT ADLT (ELECTROSURGICAL) ×8 IMPLANT
FELT TEFLON 1X6 (MISCELLANEOUS) ×9 IMPLANT
GAUZE 4X4 16PLY ~~LOC~~+RFID DBL (SPONGE) ×6 IMPLANT
GAUZE SPONGE 4X4 12PLY STRL (GAUZE/BANDAGES/DRESSINGS) ×10 IMPLANT
GAUZE SPONGE 4X4 12PLY STRL LF (GAUZE/BANDAGES/DRESSINGS) ×2 IMPLANT
GLOVE SURG ENC MOIS LTX SZ7.5 (GLOVE) ×15 IMPLANT
GLOVE SURG MICRO LTX SZ6 (GLOVE) ×5 IMPLANT
GLOVE SURG SYN 7.5  E (GLOVE) ×10
GLOVE SURG SYN 7.5 E (GLOVE) ×8 IMPLANT
GLOVE SURG SYN 7.5 PF PI (GLOVE) IMPLANT
GOWN STRL REUS W/ TWL LRG LVL3 (GOWN DISPOSABLE) ×16 IMPLANT
GOWN STRL REUS W/TWL LRG LVL3 (GOWN DISPOSABLE) ×25
HEMOSTAT POWDER SURGIFOAM 1G (HEMOSTASIS) ×15 IMPLANT
HEMOSTAT SURGICEL 2X14 (HEMOSTASIS) ×5 IMPLANT
INSERT FOGARTY XLG (MISCELLANEOUS) IMPLANT
KIT BASIN OR (CUSTOM PROCEDURE TRAY) ×5 IMPLANT
KIT SUCTION CATH 14FR (SUCTIONS) ×5 IMPLANT
KIT TURNOVER KIT B (KITS) ×5 IMPLANT
KIT VASOVIEW HEMOPRO 2 VH 4000 (KITS) ×5 IMPLANT
LEAD PACING MYOCARDI (MISCELLANEOUS) ×5 IMPLANT
MARKER GRAFT CORONARY BYPASS (MISCELLANEOUS) ×15 IMPLANT
NS IRRIG 1000ML POUR BTL (IV SOLUTION) ×25 IMPLANT
PACK E OPEN HEART (SUTURE) ×5 IMPLANT
PACK OPEN HEART (CUSTOM PROCEDURE TRAY) ×5 IMPLANT
PAD ARMBOARD 7.5X6 YLW CONV (MISCELLANEOUS) ×10 IMPLANT
PAD ELECT DEFIB RADIOL ZOLL (MISCELLANEOUS) ×5 IMPLANT
PENCIL BUTTON HOLSTER BLD 10FT (ELECTRODE) ×5 IMPLANT
POSITIONER HEAD DONUT 9IN (MISCELLANEOUS) ×5 IMPLANT
POWDER SURGICEL 3.0 GRAM (HEMOSTASIS) ×1 IMPLANT
PUNCH AORTIC ROTATE 4.0MM (MISCELLANEOUS) IMPLANT
PUNCH AORTIC ROTATE 4.5MM 8IN (MISCELLANEOUS) ×1 IMPLANT
PUNCH AORTIC ROTATE 5MM 8IN (MISCELLANEOUS) IMPLANT
SET MPS 3-ND DEL (MISCELLANEOUS) ×1 IMPLANT
SOL PREP POV-IOD 4OZ 10% (MISCELLANEOUS) ×2 IMPLANT
SPONGE T-LAP 18X18 ~~LOC~~+RFID (SPONGE) ×21 IMPLANT
SPONGE T-LAP 4X18 ~~LOC~~+RFID (SPONGE) ×7 IMPLANT
SUPPORT HEART JANKE-BARRON (MISCELLANEOUS) ×5 IMPLANT
SURGIFLO W/THROMBIN 8M KIT (HEMOSTASIS) ×5 IMPLANT
SUT BONE WAX W31G (SUTURE) ×5 IMPLANT
SUT MNCRL AB 4-0 PS2 18 (SUTURE) ×1 IMPLANT
SUT PROLENE 3 0 SH DA (SUTURE) ×2 IMPLANT
SUT PROLENE 3 0 SH1 36 (SUTURE) IMPLANT
SUT PROLENE 4 0 RB 1 (SUTURE) ×10
SUT PROLENE 4 0 SH DA (SUTURE) ×6 IMPLANT
SUT PROLENE 4-0 RB1 .5 CRCL 36 (SUTURE) ×4 IMPLANT
SUT PROLENE 5 0 C 1 36 (SUTURE) IMPLANT
SUT PROLENE 6 0 C 1 30 (SUTURE) ×2 IMPLANT
SUT PROLENE 6 0 CC (SUTURE) ×15 IMPLANT
SUT PROLENE 8 0 BV175 6 (SUTURE) ×4 IMPLANT
SUT PROLENE BLUE 7 0 (SUTURE) ×5 IMPLANT
SUT PROLENE POLY MONO (SUTURE) ×1 IMPLANT
SUT SILK  1 MH (SUTURE)
SUT SILK 1 MH (SUTURE) IMPLANT
SUT SILK 2 0 SH CR/8 (SUTURE) ×1 IMPLANT
SUT SILK 3 0 SH CR/8 (SUTURE) IMPLANT
SUT STEEL 6MS V (SUTURE) ×10 IMPLANT
SUT STEEL SZ 6 DBL 3X14 BALL (SUTURE) ×5 IMPLANT
SUT VIC AB 1 CTX 36 (SUTURE) ×20
SUT VIC AB 1 CTX36XBRD ANBCTR (SUTURE) ×8 IMPLANT
SUT VIC AB 2-0 CT1 27 (SUTURE) ×5
SUT VIC AB 2-0 CT1 TAPERPNT 27 (SUTURE) IMPLANT
SUT VIC AB 2-0 CTX 27 (SUTURE) IMPLANT
SUT VIC AB 3-0 X1 27 (SUTURE) ×1 IMPLANT
SYSTEM SAHARA CHEST DRAIN ATS (WOUND CARE) ×5 IMPLANT
TAPE CLOTH SURG 4X10 WHT LF (GAUZE/BANDAGES/DRESSINGS) ×2 IMPLANT
TAPE PAPER 2X10 WHT MICROPORE (GAUZE/BANDAGES/DRESSINGS) ×1 IMPLANT
TOWEL GREEN STERILE (TOWEL DISPOSABLE) ×5 IMPLANT
TOWEL GREEN STERILE FF (TOWEL DISPOSABLE) ×5 IMPLANT
TRAY FOLEY SLVR 16FR TEMP STAT (SET/KITS/TRAYS/PACK) ×5 IMPLANT
TUBING LAP HI FLOW INSUFFLATIO (TUBING) ×5 IMPLANT
UNDERPAD 30X36 HEAVY ABSORB (UNDERPADS AND DIAPERS) ×5 IMPLANT
WATER STERILE IRR 1000ML POUR (IV SOLUTION) ×10 IMPLANT

## 2021-04-06 NOTE — Progress Notes (Signed)
Chaplain Melvenia Beam attempted consultation for Advance Directive. Met with nurse at patient bedside. Patient is currently sedated and intubated. Nurse advised that patient may be available tomorrow for consultation. Chaplain is available for further spiritual care needs.

## 2021-04-06 NOTE — Anesthesia Postprocedure Evaluation (Signed)
Anesthesia Post Note  Patient: Meredith Guerra  Procedure(s) Performed: CORONARY ARTERY BYPASS GRAFTING (CABG) X 3,ON PUMP, USING LEFT INTERNAL MAMMARY ARTERY AND RIGHT ENDOSCOPIC GREATER SAPHENOUS VEIN CONDUITS (Chest) TRANSESOPHAGEAL ECHOCARDIOGRAM (TEE) ENDOVEIN HARVEST OF GREATER SAPHENOUS VEIN (Right) APPLICATION OF CELL SAVER     Patient location during evaluation: SICU Anesthesia Type: General Level of consciousness: sedated Pain management: pain level controlled Vital Signs Assessment: post-procedure vital signs reviewed and stable Respiratory status: patient remains intubated per anesthesia plan Cardiovascular status: levophed gtt. Postop Assessment: no apparent nausea or vomiting Anesthetic complications: no   No notable events documented.  Last Vitals:  Vitals:   04/06/21 0735 04/06/21 1423  BP:    Pulse: 84 90  Resp: (!) 22 12  Temp:    SpO2: 98% 98%    Last Pain:  Vitals:   04/06/21 0354  TempSrc: Oral  PainSc: 0-No pain                 Collene Schlichter

## 2021-04-06 NOTE — Anesthesia Procedure Notes (Signed)
Arterial Line Insertion Start/End1/17/2023 7:10 AM, 04/06/2021 7:15 AM Performed by: Waynard Edwards, CRNA, CRNA  Preanesthetic checklist: patient identified, IV checked, site marked, risks and benefits discussed, surgical consent, monitors and equipment checked, pre-op evaluation, timeout performed and anesthesia consent Lidocaine 1% used for infiltration and patient sedated Left, radial was placed Catheter size: 20 G Hand hygiene performed , maximum sterile barriers used  and Seldinger technique used Allen's test indicative of satisfactory collateral circulation Attempts: 1 Procedure performed without using ultrasound guided technique. Following insertion, dressing applied and Biopatch. Post procedure assessment: normal and unchanged  Patient tolerated the procedure well with no immediate complications.

## 2021-04-06 NOTE — Progress Notes (Signed)
Pre Procedure note for inpatients:   Meredith Guerra has been scheduled for Procedure(s): CORONARY ARTERY BYPASS GRAFTING (CABG) (N/A) TRANSESOPHAGEAL ECHOCARDIOGRAM (TEE) (N/A) today. The various methods of treatment have been discussed with the patient. After consideration of the risks, benefits and treatment options the patient has consented to the planned procedure.   The patient has been seen and labs reviewed. There are no changes in the patients condition to prevent proceeding with the planned procedure today.  Recent labs:  Lab Results  Component Value Date   WBC 13.6 (H) 04/06/2021   HGB 13.7 04/06/2021   HCT 42.1 04/06/2021   PLT 248 04/06/2021   GLUCOSE 162 (H) 04/06/2021   CHOL 327 (H) 03/30/2021   TRIG 221 (H) 03/30/2021   HDL 51 03/30/2021   LDLCALC 232 (H) 03/30/2021   ALT 28 03/30/2021   AST 36 03/30/2021   NA 137 04/06/2021   K 3.6 04/06/2021   CL 102 04/06/2021   CREATININE 1.22 (H) 04/06/2021   BUN 22 04/06/2021   CO2 21 (L) 04/06/2021   TSH 2.84 01/28/2021   HGBA1C 6.2 (H) 03/30/2021    Lovett Sox, MD 04/06/2021 7:43 AM

## 2021-04-06 NOTE — Anesthesia Procedure Notes (Signed)
Procedure Name: Intubation Date/Time: 04/06/2021 8:05 AM Performed by: Waynard Edwards, CRNA Pre-anesthesia Checklist: Patient identified, Emergency Drugs available, Suction available and Patient being monitored Patient Re-evaluated:Patient Re-evaluated prior to induction Oxygen Delivery Method: Circle system utilized Preoxygenation: Pre-oxygenation with 100% oxygen Induction Type: IV induction Ventilation: Mask ventilation without difficulty Laryngoscope Size: Miller and 3 Grade View: Grade I Tube type: Oral Tube size: 7.5 mm Number of attempts: 1 Airway Equipment and Method: Stylet Placement Confirmation: ETT inserted through vocal cords under direct vision, positive ETCO2 and breath sounds checked- equal and bilateral Secured at: 21 cm Tube secured with: Tape Dental Injury: Teeth and Oropharynx as per pre-operative assessment

## 2021-04-06 NOTE — Transfer of Care (Signed)
Immediate Anesthesia Transfer of Care Note  Patient: Meredith Guerra  Procedure(s) Performed: CORONARY ARTERY BYPASS GRAFTING (CABG) X 3,ON PUMP, USING LEFT INTERNAL MAMMARY ARTERY AND RIGHT ENDOSCOPIC GREATER SAPHENOUS VEIN CONDUITS (Chest) TRANSESOPHAGEAL ECHOCARDIOGRAM (TEE) ENDOVEIN HARVEST OF GREATER SAPHENOUS VEIN (Right) APPLICATION OF CELL SAVER  Patient Location: ICU  Anesthesia Type:General  Level of Consciousness: sedated and Patient remains intubated per anesthesia plan  Airway & Oxygen Therapy: Patient remains intubated per anesthesia plan and Patient placed on Ventilator (see vital sign flow sheet for setting)  Post-op Assessment: Report given to RN and Post -op Vital signs reviewed and stable  Post vital signs: Reviewed and stable  Last Vitals:  Vitals Value Taken Time  BP 114/57 01/17/2.  1428  Temp 35.8 C 04/06/21 1427  Pulse 87 04/06/21 1427  Resp 13 04/06/21 1427  SpO2 100 % 04/06/21 1427  Vitals shown include unvalidated device data.  Last Pain:  Vitals:   04/06/21 0354  TempSrc: Oral  PainSc: 0-No pain      Patients Stated Pain Goal: 0 (03/31/21 0610)  Complications: No notable events documented.

## 2021-04-06 NOTE — Progress Notes (Signed)
° °   °  [address]       [place] [address]             [phone]      S/p CABG x 3  Awake and following commands  Apparently had an allergic reaction earlier possibly due to DDAVP  BP 105/61    Pulse 90    Temp (!) 96.6 F (35.9 C)    Resp 15    Ht 5\' 3"  (1.6 m)    Wt 73.9 kg    SpO2 99%    BMI 28.86 kg/m  Ci = 2.6  Intake/Output Summary (Last 24 hours) at 04/06/2021 1807 Last data filed at 04/06/2021 1700 Gross per 24 hour  Intake 5354.25 ml  Output 3890 ml  Net 1464.25 ml   CBG mildly elevated Hct 30  Doing well early postop  04/08/2021 C. Viviann Spare, MD [facility] [phone]

## 2021-04-06 NOTE — Progress Notes (Signed)
°   04/05/21 2000  Assess: MEWS Score  Temp 99 F (37.2 C)  BP 117/80  Pulse Rate (!) 111  ECG Heart Rate (!) 111  Resp 20  Level of Consciousness Alert  SpO2 93 %  O2 Device Room Air  Assess: MEWS Score  MEWS Temp 0  MEWS Systolic 0  MEWS Pulse 2  MEWS RR 0  MEWS LOC 0  MEWS Score 2  MEWS Score Color Yellow  Assess: if the MEWS score is Yellow or Red  Were vital signs taken at a resting state? Yes  Focused Assessment No change from prior assessment  Early Detection of Sepsis Score *See Row Information* Low  MEWS guidelines implemented *See Row Information* No, previously yellow, continue vital signs every 4 hours  Treat  MEWS Interventions Other (Comment)  Pain Scale 0-10  Pain Score 0  Notify: Charge Nurse/RN  Name of Charge Nurse/RN Notified  Kenney Houseman D, RN)  Date Charge Nurse/RN Notified 04/05/21  Time Charge Nurse/RN Notified 2010

## 2021-04-06 NOTE — Brief Op Note (Signed)
03/29/2021 - 04/06/2021  12:09 PM  PATIENT:  Meredith Guerra  71 y.o. female  PRE-OPERATIVE DIAGNOSIS:  Coronary Artery Disease  POST-OPERATIVE DIAGNOSIS:  Coronary Artery Disease  PROCEDURES: CORONARY ARTERY BYPASS GRAFTING   LIMA-LAD SVG-Ramus Int. SVG-OM  TRANSESOPHAGEAL ECHOCARDIOGRAM   ENDOVEIN HARVEST OF GREATER SAPHENOUS VEIN (Right) Vein harvest time:  46min  Vein prep time: [id number]  APPLICATION OF CELL SAVER  SURGEON:  Dahlia Byes, MD - Primary  PHYSICIAN ASSISTANT: Crew Goren  ASSISTANTS: Dineen Kid, RN, RN First Assistant   ANESTHESIA:   general  EBL:  839ml  BLOOD ADMINISTERED: 1 unit PRBC's. 2 units FFP, 560ml Cell Saver blood  DRAINS:  Left pleural and mediastinal drains    LOCAL MEDICATIONS USED:  NONE  SPECIMEN:  No Specimen  DISPOSITION OF SPECIMEN:  N/A  COUNTS:  Correct  DICTATION: .Dragon Dictation  PLAN OF CARE: Admit to inpatient   PATIENT DISPOSITION:  ICU - intubated and hemodynamically stable.   Delay start of Pharmacological VTE agent (>24hrs) due to surgical blood loss or risk of bleeding: yes

## 2021-04-06 NOTE — Plan of Care (Signed)
°  Problem: Clinical Measurements: Goal: Diagnostic test results will improve 04/06/2021 1746 by Allene Pyo, RN Outcome: Progressing 04/06/2021 1743 by Allene Pyo, RN Outcome: Progressing Goal: Respiratory complications will improve 04/06/2021 1746 by Allene Pyo, RN Outcome: Progressing 04/06/2021 1743 by Allene Pyo, RN Outcome: Progressing Goal: Cardiovascular complication will be avoided 04/06/2021 1746 by Allene Pyo, RN Outcome: Progressing 04/06/2021 1743 by Allene Pyo, RN Outcome: Progressing   Problem: Coping: Goal: Level of anxiety will decrease 04/06/2021 1746 by Allene Pyo, RN Outcome: Progressing 04/06/2021 1743 by Allene Pyo, RN Outcome: Progressing   Problem: Pain Managment: Goal: General experience of comfort will improve 04/06/2021 1746 by Allene Pyo, RN Outcome: Progressing 04/06/2021 1743 by Allene Pyo, RN Outcome: Progressing   Problem: Skin Integrity: Goal: Risk for impaired skin integrity will decrease Outcome: Progressing   Problem: Cardiac: Goal: Will achieve and/or maintain hemodynamic stability Outcome: Progressing   Problem: Respiratory: Goal: Respiratory status will improve Outcome: Progressing

## 2021-04-06 NOTE — Anesthesia Procedure Notes (Signed)
Central Venous Catheter Insertion Performed by: Collene Schlichter, MD, anesthesiologist Start/End1/17/2023 7:15 AM, 04/06/2021 7:20 AM Patient location: Pre-op. Preanesthetic checklist: patient identified, IV checked, site marked, risks and benefits discussed, surgical consent, monitors and equipment checked, pre-op evaluation and timeout performed Position: Trendelenburg Hand hygiene performed  and maximum sterile barriers used  Total catheter length 100. PA cath was placed.Swan type:thermodilution PA Cath depth:45 Procedure performed without using ultrasound guided technique. Attempts: 1 Patient tolerated the procedure well with no immediate complications.

## 2021-04-06 NOTE — Procedures (Signed)
Extubation Procedure Note  Patient Details:   Name: Meredith Guerra DOB: 1950-09-06 MRN: [id number]   Airway Documentation:    Vent end date: 04/06/21 Vent end time: 1813   Evaluation  O2 sats: stable throughout Complications: No apparent complications Patient did tolerate procedure well. Bilateral Breath Sounds: Clear   Yes,  Prior to extubation, pt did not have a positive cuff leak. RN notified Dr. Dorris Fetch. Per MD wanted to extubated pt. Pt was extubated to [facility]. Pt tolerated well with SVS. No stridor noted and no complications. Pt was able to state her name. RN at bedside.  Megan Mans 04/06/2021, 6:13 PM

## 2021-04-06 NOTE — Anesthesia Procedure Notes (Signed)
Central Venous Catheter Insertion Performed by: Santa Lighter, MD, anesthesiologist Start/End1/17/2023 7:05 AM, 04/06/2021 7:15 AM Patient location: Pre-op. Preanesthetic checklist: patient identified, IV checked, site marked, risks and benefits discussed, surgical consent, monitors and equipment checked, pre-op evaluation, timeout performed and anesthesia consent Position: Trendelenburg Lidocaine 1% used for infiltration and patient sedated Hand hygiene performed , maximum sterile barriers used  and Seldinger technique used Catheter size: 9 Fr Central line was placed.MAC introducer Procedure performed using ultrasound guided technique. Ultrasound Notes:anatomy identified, needle tip was noted to be adjacent to the nerve/plexus identified, no ultrasound evidence of intravascular and/or intraneural injection and image(s) printed for medical record Attempts: 1 Following insertion, line sutured, dressing applied and Biopatch. Post procedure assessment: free fluid flow, blood return through all ports and no air  Patient tolerated the procedure well with no immediate complications.

## 2021-04-06 NOTE — Plan of Care (Signed)

## 2021-04-06 NOTE — Progress Notes (Signed)
ANTICOAGULATION CONSULT NOTE  Pharmacy Consult for heparin Indication: chest pain/ACS Brief A/P: Heparin level within goal range Continue Heparin at current rate   Allergies  Allergen Reactions   Compazine [Prochlorperazine] Anxiety   Dilaudid [Hydromorphone]     Pt does not tolerate well   Haloperidol     homicidal thoughts   Reglan [Metoclopramide] Anxiety   Zolmitriptan Anxiety    Patient Measurements: Height: 5\' 3"  (160 cm) Weight: 76.8 kg (169 lb 5 oz) IBW/kg (Calculated) : 52.4 Heparin Dosing Weight: 76kg  Vital Signs: Temp: 97.7 F (36.5 C) (01/16 2329) Temp Source: Oral (01/16 2329) BP: 136/91 (01/16 2329) Pulse Rate: 114 (01/16 2329)  Labs: Recent Labs    04/03/21 0939 04/04/21 0033 04/04/21 0033 04/05/21 0045 04/06/21 0034  HGB  --  13.0   < > 14.5 13.7  HCT  --  39.9  --  44.4 42.1  PLT  --  253  --  266 248  HEPARINUNFRC 0.40  --   --   --  0.52  CREATININE  --  1.16*  --   --  1.22*   < > = values in this interval not displayed.     Estimated Creatinine Clearance: 42.7 mL/min (A) (by C-G formula based on SCr of 1.22 mg/dL (H)).  Assessment: 71 y.o. female with CAD awaiting CABG for heparin  Goal of Therapy:  Heparin level 0.3-0.7 units/ml Monitor platelets by anticoagulation protocol: Yes   Plan:  Continue Heparin at current rate   78, PharmD, BCPS

## 2021-04-07 ENCOUNTER — Encounter (HOSPITAL_COMMUNITY): Payer: Self-pay | Admitting: Cardiothoracic Surgery

## 2021-04-07 ENCOUNTER — Inpatient Hospital Stay (HOSPITAL_COMMUNITY): Payer: Medicare Other

## 2021-04-07 LAB — CBC
HCT: 29.7 % — ABNORMAL LOW (ref 36.0–46.0)
HCT: 34.1 % — ABNORMAL LOW (ref 36.0–46.0)
Hemoglobin: 9.9 g/dL — ABNORMAL LOW (ref 12.0–15.0)
Hemoglobin: 11.3 g/dL — ABNORMAL LOW (ref 12.0–15.0)
MCH: 29.6 pg (ref 26.0–34.0)
MCH: 30 pg (ref 26.0–34.0)
MCHC: 33.3 g/dL (ref 30.0–36.0)
MCHC: 33.1 g/dL (ref 30.0–36.0)
MCV: 88.7 fL (ref 80.0–100.0)
MCV: 90.5 fL (ref 80.0–100.0)
Platelets: 96 10*3/uL — ABNORMAL LOW (ref 150–400)
Platelets: 141 10*3/uL — ABNORMAL LOW (ref 150–400)
RBC: 3.35 MIL/uL — ABNORMAL LOW (ref 3.87–5.11)
RBC: 3.77 MIL/uL — ABNORMAL LOW (ref 3.87–5.11)
RDW: 14.3 % (ref 11.5–15.5)
RDW: 14.7 % (ref 11.5–15.5)
WBC: 15.5 10*3/uL — ABNORMAL HIGH (ref 4.0–10.5)
WBC: 25 10*3/uL — ABNORMAL HIGH (ref 4.0–10.5)
nRBC: 0 % (ref 0.0–0.2)
nRBC: 0 % (ref 0.0–0.2)

## 2021-04-07 LAB — BASIC METABOLIC PANEL
Anion gap: 6 (ref 5–15)
Anion gap: 11 (ref 5–15)
BUN: 12 mg/dL (ref 8–23)
BUN: 13 mg/dL (ref 8–23)
CO2: 24 mmol/L (ref 22–32)
CO2: 24 mmol/L (ref 22–32)
Calcium: 8.4 mg/dL — ABNORMAL LOW (ref 8.9–10.3)
Calcium: 9.3 mg/dL (ref 8.9–10.3)
Chloride: 106 mmol/L (ref 98–111)
Chloride: 99 mmol/L (ref 98–111)
Creatinine, Ser: 0.87 mg/dL (ref 0.44–1.00)
Creatinine, Ser: 0.96 mg/dL (ref 0.44–1.00)
GFR, Estimated: >60 mL/min (ref >60)
GFR, Estimated: >60 mL/min (ref >60)
Glucose, Bld: 120 mg/dL — ABNORMAL HIGH (ref 70–99)
Glucose, Bld: 136 mg/dL — ABNORMAL HIGH (ref 70–99)
Potassium: 4.6 mmol/L (ref 3.5–5.1)
Potassium: 4 mmol/L (ref 3.5–5.1)
Sodium: 136 mmol/L (ref 135–145)
Sodium: 134 mmol/L — ABNORMAL LOW (ref 135–145)

## 2021-04-07 LAB — GLUCOSE, CAPILLARY
Glucose-Capillary: 107 mg/dL — ABNORMAL HIGH (ref 70–99)
Glucose-Capillary: 110 mg/dL — ABNORMAL HIGH (ref 70–99)
Glucose-Capillary: 111 mg/dL — ABNORMAL HIGH (ref 70–99)
Glucose-Capillary: 115 mg/dL — ABNORMAL HIGH (ref 70–99)
Glucose-Capillary: 120 mg/dL — ABNORMAL HIGH (ref 70–99)
Glucose-Capillary: 120 mg/dL — ABNORMAL HIGH (ref 70–99)
Glucose-Capillary: 128 mg/dL — ABNORMAL HIGH (ref 70–99)
Glucose-Capillary: 135 mg/dL — ABNORMAL HIGH (ref 70–99)
Glucose-Capillary: 151 mg/dL — ABNORMAL HIGH (ref 70–99)
Glucose-Capillary: 91 mg/dL (ref 70–99)
Glucose-Capillary: 96 mg/dL (ref 70–99)

## 2021-04-07 LAB — COOXEMETRY PANEL
Carboxyhemoglobin: 1.1 % (ref 0.5–1.5)
Methemoglobin: 1.1 % (ref 0.0–1.5)
O2 Saturation: 64.1 %
Total hemoglobin: 9.3 g/dL — ABNORMAL LOW (ref 12.0–16.0)

## 2021-04-07 LAB — BPAM FFP
Blood Product Expiration Date: 202301182359
Blood Product Expiration Date: 202301182359
ISSUE DATE / TIME: 202301171138
ISSUE DATE / TIME: 202301171138
Unit Type and Rh: 6200
Unit Type and Rh: 6200

## 2021-04-07 LAB — PREPARE FRESH FROZEN PLASMA

## 2021-04-07 LAB — MAGNESIUM
Magnesium: 2.3 mg/dL (ref 1.7–2.4)
Magnesium: 2.1 mg/dL (ref 1.7–2.4)

## 2021-04-07 IMAGING — DX DG CHEST 1V PORT
1 series · 1 of 1 positions shown · non-contrast
Comparison: Chest radiograph dated [DATE]

CLINICAL DATA: Status post open heart surgery, sore throat.

EXAM:
PORTABLE CHEST 1 VIEW

[chest ap]
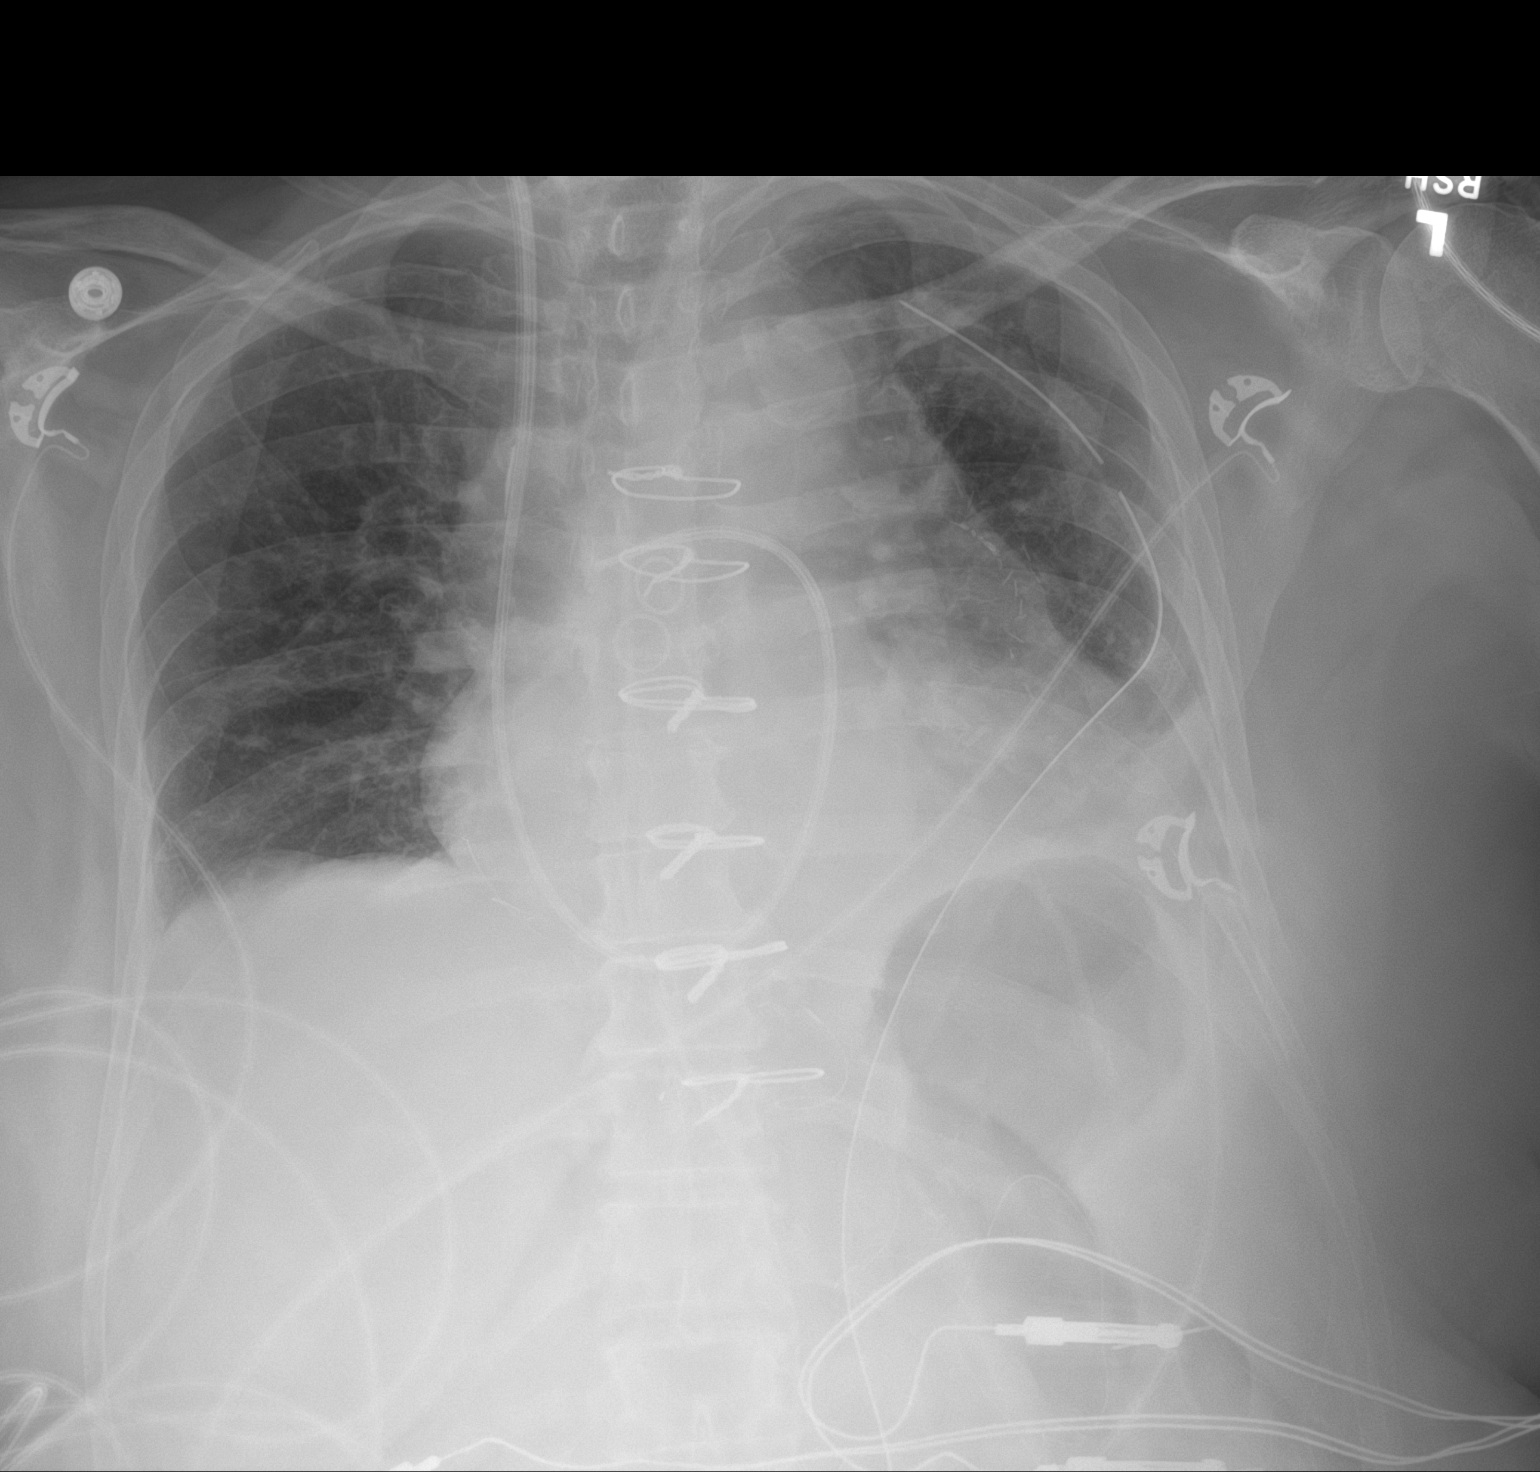

[1 of 1 positions shown; findings below may reference images not displayed]

FINDINGS: Interval removal of the endotracheal tube and feeding tube. Right IJ
access swans Ganz catheter with distal tip in the right pulmonary
artery, unchanged. Left sided thoracostomy tube is also unchanged.

Stable cardiomegaly with postsurgical changes. Low lung volumes with
left basilar atelectasis or infiltrate. No appreciable pneumothorax.
IMPRESSION: 1. Interval removal of the endotracheal tube and feeding tube.
2. Stable cardiomegaly with postsurgical changes.
3. Low lung volumes with increase in left basilar opacity, which may
represent effusion or atelectasis.

## 2021-04-07 MED ORDER — INSULIN ASPART 100 UNIT/ML IJ SOLN
0.0000 [IU] | INTRAMUSCULAR | Status: DC
  Administered 2021-04-08 (×2): 2 [IU] via SUBCUTANEOUS

## 2021-04-07 MED ORDER — FUROSEMIDE 10 MG/ML IJ SOLN
20.0000 mg | Freq: Two times a day (BID) | INTRAMUSCULAR | Status: DC
  Administered 2021-04-07 – 2021-04-11 (×10): 20 mg via INTRAVENOUS
  Filled 2021-04-07 (×10): qty 2

## 2021-04-07 MED ORDER — FENTANYL CITRATE PF 50 MCG/ML IJ SOSY
50.0000 ug | PREFILLED_SYRINGE | INTRAMUSCULAR | Status: DC | PRN
  Administered 2021-04-07 (×3): 50 ug via INTRAVENOUS
  Filled 2021-04-07 (×3): qty 1

## 2021-04-07 MED ORDER — KETOROLAC TROMETHAMINE 15 MG/ML IJ SOLN
15.0000 mg | Freq: Four times a day (QID) | INTRAMUSCULAR | Status: AC | PRN
  Administered 2021-04-07 – 2021-04-08 (×3): 15 mg via INTRAVENOUS
  Filled 2021-04-07 (×2): qty 1

## 2021-04-07 NOTE — Discharge Instructions (Signed)

## 2021-04-07 NOTE — Plan of Care (Signed)
°  Problem: Respiratory: °Goal: Respiratory status will improve °Outcome: Progressing °  °Problem: Cardiac: °Goal: Will achieve and/or maintain hemodynamic stability °Outcome: Progressing °  °Problem: Urinary Elimination: °Goal: Ability to achieve and maintain adequate renal perfusion and functioning will improve °Outcome: Progressing °  °

## 2021-04-07 NOTE — Progress Notes (Signed)
Patient ID: Meredith Guerra, female   DOB: 02/05/1951, 71 y.o.   MRN: [id number] [facility] Evening Rounds:  Hemodynamically stable in sinus rhythm.  Sats 98%  Urine output good.  Chest tube output low.  Labs pending this evening.

## 2021-04-07 NOTE — Discharge Summary (Addendum)
Physician Discharge Summary  Patient ID: Meredith Guerra MRN: [id number] DOB/AGE: 1950-11-12 71 y.o.  Admit date: 03/29/2021 Discharge date: 04/12/2021  Admission Diagnoses:  Coronary artery disease Acute ST elevation myocardial infarction Pneumonia due to COVID-19 virus Cardiomyopathy Mixed hyperlipidemia  Discharge Diagnoses:   Coronary artery disease Acute ST elevation myocardial infarction Pneumonia due to COVID-19 virus Cardiomyopathy Mixed hyperlipidemia S/P CABG x 3 Expected acute blood loss anemia   Discharged Condition: good  History of Present Illness:       Meredith Guerra is a 71 yo obese female with history of HTN, hypothyroidism, Depression,  and Asthma.  She also has a recent diagnosis of COVID 19, testing postitive on 1/5.  She developed complaints of sudden onset chest pain with associated nausea, shortness of breath, diaphoresis.  The pain also radiated to his left arm and jaw.  EKG showed anterolateral ST elevation.  She was transferred emergently to [facility] for further workup.  She was taken to the catheterization lab and was found to have LM disease.  She was started on Heparin and admitted to the ICU for further care.  Cardiothoracic consultation is being requested for coronary bypass grafting procedure.  The patient developed fever with severe URI symptoms.  She continued to feel poorly and ultimately was tested for COVID and was positive.  She has been vaccinated with Moderna x 3.  The patient was treated with anti-viral.  The patient is currently chest pain free.  She denies history of smoking.  She is not a diabetic.  Prior to the recent COVID illness and chest pain the patient was able to do whatever she needs to do.  The patient also is currently being worked up by GI for difficulty swallowing/clearing food after eating.   After review of all the clinical data, Dr. Maren Beach offered coronary bypass grafting to Meredith Guerra once she had sufficiently recovered  from the acute phase of the COVID infection.  Meredith Guerra decided to proceed with appropriate work-up in preparation for surgery.  Course in Hospital: Meredith Guerra remained on IV heparin and nitroglycerin.  Cardiac status remained stable.  A cardiac MRI was obtained that showed improved left ventricular function since the earlier echocardiogram with good myocardial viability demonstrated.  The troponin level was shown to be trending down as well.  Serial chest x-rays showed improving lung fields with resolving COVID pneumonitis.  Carotid Dopplers showed no obstructive cerebrovascular disease. On 04/06/2020, [name] was taken to the operating room where three-vessel coronary bypass grafting was carried out without complication.  Following the procedure, she separated from cardiopulmonary bypass without difficulty on milrinone infusion.  She was transferred to the surgical ICU in stable condition.  She was weaned from the ventilator during the afternoon following surgery and extubated per protocol around 6 PM.  Vital signs and hemodynamics remained satisfactory.  Maintain a stable sinus rhythm.  The monitoring lines were removed on the first postoperative day.  Chest tubes were left in place for drainage on the first post-op day but were removed on the second day.  Diet and activity were advanced per protocol and she made good progress. She was ready for transfer to[facility]E Progressive Care on post-op day 2.  He did develop postoperative atrial fibrillation with RVR and was placed on intravenous amiodarone and continued on beta-blockers.  She has been chemically cardioverted to sinus rhythm with this regimen.  Pacer wires were removed on postoperative day 5.  She does have a postoperative leukocytosis which has  shown a steady trend downward felt most likely to be related to her recent COVID infection as well as postoperative reactive inflammation from the surgery.  Oxygen has been weaned and she maintains good  saturations on room air.  Incisions are healing well without evidence of infection.  She is tolerating diet.  She has maintained normal renal function.  She has a mild expected acute blood loss anemia which is stable.  Overall, at the time of discharge the patient is felt to be quite stable.  She will be started on DAPT therapy with STEMI on presentation.  Consults: None  Significant Diagnostic Studies: angiography:   LM: 90% distal stenosis at the trifurcation LAD: Proximal focal 80% stenosis at bifurcation with large septal perforator branch, focal mid 60% stenosis Ramus intermedius: No significant disease Left circumflex: Mid 30% stenosis RCA: Moderate diffuse disease in small caliber RPDA   Aspirin Aggrastat for next 4 hours, then IV heparin IV nitroglycerin BP control CVTS consult for CABG   Elder Negus, MD Pager: [phone] Office: [phone]  Treatments: Operative Report    DATE OF PROCEDURE: 03/29/2021     PROCEDURES PERFORMED:  1.  Coronary artery bypass grafting x3 (left internal mammary artery to LAD, saphenous vein graft to ramus intermedius, saphenous vein graft to circumflex marginal).  2.  Endoscopic harvest of right leg greater saphenous vein.   SURGEON:  [name], MD   ASSISTANT:  [name], PA-C.     ANESTHESIA:  General by Dr. [name].   PREOPERATIVE DIAGNOSIS:   1.  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 2.  Recent COVID viral infection and bronchitis. 3.  History of Takotsubo cardiomyopathy.   POSTOPERATIVE DIAGNOSES:  1.  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 2.  Recent COVID viral infection and bronchitis. 3.  History of Takotsubo cardiomyopathy.  Discharge Exam: performed by [name] PA-C Blood pressure 124/66, pulse 91, temperature 99 F (37.2 C), temperature source Oral, resp. rate 17, height 5\' 3"  (1.6 m), weight 75 kg, SpO2 93 %.  General appearance: alert,  cooperative, and no distress Heart: regular rate and rhythm, no further atrial fib. HR 80's-90. Lungs: breath sounds are full, clear and equal.  Abdomen: soft, NT. Extremities: no edema Wound: the sternotomy and RLE EVH incisions are intact and dry.   Discharge disposition: 01-Home or Self Care  Discharge Instructions     Amb Referral to Cardiac Rehabilitation   Complete by: As directed    Diagnosis:  CABG NSTEMI     CABG X ___: 3   After initial evaluation and assessments completed: Virtual Based Care may be provided alone or in conjunction with Phase 2 Cardiac Rehab based on patient barriers.: Yes      Allergies as of 04/12/2021       Reactions   Compazine [prochlorperazine] Anxiety   Dilaudid [hydromorphone]    Pt does not tolerate well   Haloperidol    homicidal thoughts   Reglan [metoclopramide] Anxiety   Zolmitriptan Anxiety        Medication List     STOP taking these medications    amphetamine-dextroamphetamine 20 MG tablet Commonly known as: ADDERALL   diphenhydrAMINE 25 mg capsule Commonly known as: BENADRYL   fenofibrate 145 MG tablet Commonly known as: TRICOR   ibuprofen 200 MG tablet Commonly known as: ADVIL   Paxlovid (300/100) 20 x 150 MG & 10 x 100MG  Tbpk Generic drug: nirmatrelvir & ritonavir   propranolol 80  MG tablet Commonly known as: INDERAL       TAKE these medications    acetaminophen 500 MG tablet Commonly known as: TYLENOL Take 1,000 mg by mouth every 6 (six) hours as needed for mild pain, headache or fever.   albuterol 108 (90 Base) MCG/ACT inhaler Commonly known as: VENTOLIN HFA Inhale 2 puffs into the lungs in the morning and at bedtime.   albuterol 2 MG tablet Commonly known as: PROVENTIL Take 2 mg by mouth 3 (three) times daily as needed for wheezing or shortness of breath.   amiodarone 200 MG tablet Commonly known as: PACERONE Take 2 tablets (400 mg total) by mouth 2 (two) times daily. X 3 days, then decrease  to 200 mg BID x 7 days, then decrease to 200 mg daily   aspirin 81 MG EC tablet Take 1 tablet (81 mg total) by mouth daily.   atorvastatin 80 MG tablet Commonly known as: LIPITOR Take 1 tablet (80 mg total) by mouth daily. What changed:  medication strength how much to take when to take this   budesonide-formoterol 160-4.5 MCG/ACT inhaler Commonly known as: SYMBICORT Inhale 2 puffs into the lungs 2 (two) times daily.   butalbital-acetaminophen-caffeine 50-325-40 MG tablet Commonly known as: FIORICET Take 1 tablet by mouth 2 (two) times daily as needed for migraine.   calcium carbonate 500 MG chewable tablet Commonly known as: TUMS - dosed in mg elemental calcium Take 1 tablet by mouth daily as needed for heartburn, indigestion What changed:  how much to take how to take this when to take this reasons to take this additional instructions   clonazePAM 0.5 MG tablet Commonly known as: KLONOPIN Take 0.5 mg by mouth daily as needed for anxiety.   clopidogrel 75 MG tablet Commonly known as: Plavix Take 1 tablet (75 mg total) by mouth daily.   DULoxetine 30 MG capsule Commonly known as: CYMBALTA Take 30 mg by mouth daily. Takes in addition to 60mg  dose for a total daily dose of 90mg    DULoxetine 60 MG capsule Commonly known as: CYMBALTA Take 60 mg by mouth at bedtime. Takes in addition to 30mg  dose for a total daily dose of 90mg    levothyroxine 25 MCG tablet Commonly known as: SYNTHROID Take 25 mcg by mouth See admin instructions. Take 1 and 1/2 every morning   loratadine 10 MG tablet Commonly known as: CLARITIN Take 10 mg by mouth at bedtime.   metoprolol tartrate 25 MG tablet Commonly known as: LOPRESSOR Take 0.5 tablets (12.5 mg total) by mouth 2 (two) times daily.   montelukast 10 MG tablet Commonly known as: SINGULAIR Take 10 mg by mouth at bedtime.   NURTEC PO Take by mouth as needed.   ondansetron 4 MG tablet Commonly known as: ZOFRAN Take 4 mg by  mouth every 8 (eight) hours as needed for nausea or vomiting.   pantoprazole 40 MG tablet Commonly known as: PROTONIX Take 80 mg by mouth daily.   promethazine 12.5 MG tablet Commonly known as: PHENERGAN Take 12.5 mg by mouth every 4 (four) hours as needed for nausea or vomiting.   tiZANidine 4 MG tablet Commonly known as: ZANAFLEX Take 2-8 mg by mouth See admin instructions. Take 2 tablets every evening if needed take 1/2 to a whole if headache is bad   traMADol 50 MG tablet Commonly known as: ULTRAM Take 1 tablet (50 mg total) by mouth every 6 (six) hours as needed for moderate pain.   traZODone 150 MG tablet Commonly  known as: DESYREL Take 150 mg by mouth at bedtime.   Vitamin D-3 25 MCG (1000 UT) Caps Take 1,000 Units by mouth daily.        Follow-up Information     Elder Negus, MD. Go on 04/23/2021.   Specialties: Cardiology, Radiology Why: Your appointment is at 1:30 PM.  Please arrive at 1:00 PM Contact information: [address] [place] [address] [phone]         Lovett Sox, MD Follow up on 05/03/2021.   Specialty: Cardiothoracic Surgery Why: Your appointment is at 2:00 Please arrive 30 minutes early for chest x-ray to be performed by [facility] located on the first floor of the same building.        [facility] Follow up.   Specialty: Home Health Services Why: Iantha Fallen)- [facility] referral recieved from [facility] office pre-op- they will follow up post discharge and contact you to schedule visits Contact information: [address] [place] [address] [phone]                The patient has been discharged on:   1.Beta Blocker:  Yes [ x ]                              No   [   ]                              If No, reason:  2.Ace Inhibitor/ARB: Yes [   ]                                     No  [ x   ]                                     If No, reason: labile BP  3.Statin:   Yes                    If No, reason:  4.Ecasa:  Yes  [ x ]                  No   [   ]                  If No, reason:  5. Plavix: presented with ACS: Y   Signed:  Erin Raford Pitcher, PA-C 04/12/2021, 8:59 AM  Dc instructions reviewed with patient patient examined and medical record reviewed,agree with above note. Lovett Sox 04/13/2021

## 2021-04-07 NOTE — Op Note (Signed)
NAME: Meredith Guerra, Meredith Guerra MEDICAL RECORD NO: [id number] ACCOUNT NO: [id number] DATE OF BIRTH: 11-Jul-1950 FACILITY: [facility] LOCATION: [facility] PHYSICIAN: Ivin Poot III, MD  Operative Report   DATE OF PROCEDURE: 03/29/2021   PROCEDURES PERFORMED:  1.  Coronary artery bypass grafting x3 (left internal mammary artery to LAD, saphenous vein graft to ramus intermedius, saphenous vein graft to circumflex marginal).  2.  Endoscopic harvest of right leg greater saphenous vein.  SURGEON:  Len Childs, MD  ASSISTANT:  Enid Cutter, PA-C.  A trained surgical first assistant was required for the surgery due to the complexity of the operation and the standard surgical care for this procedure.  The assistant was important in the harvest and preparation of  the saphenous vein conduit, the closure of the leg incision, first assisting the distal anastomoses for suctioning, exposure, dissection and keeping the suturing organized.  ANESTHESIA:  General by Dr. Hoy Morn.  PREOPERATIVE DIAGNOSIS:   1.  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 2.  Recent COVID viral infection and bronchitis. 3.  History of Takotsubo cardiomyopathy.  POSTOPERATIVE DIAGNOSES:  1.  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 2.  Recent COVID viral infection and bronchitis. 3.  History of Takotsubo cardiomyopathy.  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  The chest pain developed just as she was finishing her oral course of antiviral  medication and was associated with shortness of breath and decreased exercise tolerance.  The patient presented to the ED and was seen by Cardiology.  A 90% stenosis at the left main bifurcation was noted and the patient was placed on IV heparin and  nitroglycerin.  Because of her recent COVID infection and an abnormal chest x-ray with  bilateral infiltrates, she was managed medically to allow adequate recovery from [place] before cardiopulmonary bypass and heart bypass surgery.  I examined the patient in her hospital room following the cardiac catheterization, which showed severe left main stenosis with graftable vessels beyond the obstruction.  The echocardiogram on admission showed apical hypokinesia with EF of 30%, consistent  with Takotsubo cardiomyopathy.  A viability study was performed prior to surgery, which showed no scarring of the myocardium.   While the patient was waiting to recover from [place], I discussed the procedure of CABG in detail with both the patient and her family.  They understood the intended benefits to include relief of angina, preservation of LV function and improve survival.   They understood the details of surgery including the use of general anesthesia and cardiopulmonary bypass, the location of the surgical incisions, and the expected postoperative hospital recovery.  They understood the risks of the surgery and to include  stroke, bleeding, MI, blood transfusion requirement, infection, organ failure, and death.  After reviewing these issues she demonstrated her understanding and agreed to proceed with surgery under what I felt was an informed consent.  OPERATIVE FINDINGS:  1.  Adequate conduit. 2.  Small but adequate targets. 3.  Diffuse coagulopathy, requiring transfusion of both packed blood cells and FFP therapy.  DESCRIPTION OF PROCEDURE:  The patient was brought from preoperative holding where informed consent was documented and final issue was addressed with the patient in a face-to-face meeting.  The patient was then brought to the operating room and placed  supine on the operating table and general anesthesia was induced under invasive hemodynamic monitoring.  The patient remained stable.  A transesophageal echo probe was placed by the anesthesiologist, which showed EF of 35%.  No valvular  disease was  noted.  The patient was prepped and draped after the Foley catheter was placed and a proper timeout was performed.  A sternal incision was made as the saphenous vein was harvested endoscopically from the right leg.  The left internal mammary artery was  harvested as a pedicle graft from its origin at the subclavian vessel.  It was a 1.5 mm vessel with good flow.  The sternal retractor was placed and the pericardium was opened and suspended.  Pursestrings were placed in the ascending aorta and right  atrium.  After heparin was administered and the ACT was documented as being therapeutic, the patient was cannulated and placed on cardiopulmonary bypass.  The coronaries were identified for grafting and adequate targets were found in the distribution of  the LAD, ramus, and circumflex.  Mammary artery and vein grafts were prepared for the distal anastomosis and cardioplegia cannula was replaced, both antegrade and retrograde cold blood cardioplegia.  The patient was cooled to 32 degrees and the aortic  crossclamp was applied.  One liter of cold blood cardioplegia was delivered in split doses between the antegrade aortic and retrograde coronary sinus catheters.  There was good cardioplegic arrest and subsequent temperature dropped less than 14 degrees.   Cardioplegia was delivered every 20 minutes while the crossclamp was in place.  The distal coronary anastomoses were performed.  The first distal anastomosis was the circumflex marginal distal to the left main stenosis.  A reverse saphenous vein was sewn end-to-side with running 7-0 Prolene.  There was good flow through the graft.   Cardioplegia was redosed through this graft.  A second distal anastomosis was to the ramus intermedius branch of the left coronary.  This was distal to the proximal left main stenosis.  A reverse saphenous vein was sewn end-to-side with running 7-0 Prolene.  There was good flow through the graft.   Cardioplegia was  redosed through the vein grafts.  The third distal anastomosis was of the mid LAD.  This was 1.5 mm vessel.  The left IMA pedicle was brought through an opening in the left lateral pericardium was brought down onto the LAD and sewn end-to-side with running 8-0 Prolene.  There was good  flow through the anastomosis after briefly releasing the pedicle bulldog and the mammary artery.  The bulldog was reapplied and the pedicle was secured to the epicardium with 6-0 Prolenes.  Cardioplegia was redosed.  While the cross-clamp was still in place the two vein proximal anastomoses were constructed with a 4.5 mm punch and running 6-0 Prolene.  Prior to tying down the final proximal anastomosis, air was vented from the coronaries with a dose of retrograde  warm blood cardioplegia and the usual de-airing maneuvers.  The crossclamp was removed.  The heart resumed a spontaneous rhythm.  The anastomoses were found to be hemostatic at the proximal and distal sites.  The patient was rewarmed and reperfused, temporary pacing wires were applied. When the patient was rewarmed, the lungs were expanded  and the ventilator was resumed.  The patient was started on low dose milrinone and weaned off cardiopulmonary bypass.  Hemodynamics were stable.  Echo showed improvement in the LV function now to almost normal contractility.  Protamine was administered without adverse reaction.  The cannulas were removed.  There was still diffuse coagulopathy after the protamine, the patient was given  FFP with improved coagulation function.  The superior pericardial fat was close over the  aorta and vein grafts.  An anterior mediastinal and left pleural chest tube were placed and brought out through separate incisions.  The sternum was then closed with interrupted steel wire.  The patient remained stable.  The pectoralis fascia was closed  with a running #1 Vicryl.  Subcutaneous and skin layers were closed with running Vicryl and sterile  dressings were applied.  Total cardiopulmonary bypass time was 140 minutes.     SUJ D: 04/06/2021 5:36:30 pm T: 04/06/2021 10:23:00 pm  JOB: [id number]

## 2021-04-07 NOTE — Progress Notes (Addendum)
[facility] DAILY ICU PROGRESS NOTE                   [address]            [place] [address]          [phone]   1 Day Post-Op Procedure(s) (LRB): CORONARY ARTERY BYPASS GRAFTING (CABG) X 3,ON PUMP, USING LEFT INTERNAL MAMMARY ARTERY AND RIGHT ENDOSCOPIC GREATER SAPHENOUS VEIN CONDUITS (N/A) TRANSESOPHAGEAL ECHOCARDIOGRAM (TEE) (N/A) ENDOVEIN HARVEST OF GREATER SAPHENOUS VEIN (Right) APPLICATION OF CELL SAVER  Total Length of Stay:  LOS: 9 days   Subjective:  Extubated ~6pm. Awake and alert. Having some left-side chest soreness.   Milrinone infusing at 0.54mcg/kg/min, cardiac index 2.5.  Objective: Vital signs in last 24 hours: Temp:  [96.1 F (35.6 C)-97.9 F (36.6 C)] 97.7 F (36.5 C) (01/18 0821) Pulse Rate:  [67-96] 78 (01/18 0821) Cardiac Rhythm: Normal sinus rhythm (01/18 0038) Resp:  [11-24] 16 (01/18 0821) BP: (88-151)/(55-90) 114/64 (01/18 0802) SpO2:  [98 %-100 %] 98 % (01/18 0821) Arterial Line BP: (70-164)/(52-85) 141/71 (01/18 0821) FiO2 (%):  [40 %-50 %] 40 % (01/17 1734) Weight:  [81.1 kg] 81.1 kg (01/18 0613)  Filed Weights   03/30/21 0500 04/06/21 0500 04/07/21 0613  Weight: 76.8 kg 73.9 kg 81.1 kg    Weight change: 7.2 kg   Hemodynamic parameters for last 24 hours: PAP: (31-47)/(13-25) 47/25 CO:  [3.2 L/min-5.5 L/min] 4 L/min CI:  [1.8 L/min/m2-3.1 L/min/m2] 2.3 L/min/m2  Intake/Output from previous day: 01/17 0701 - 01/18 0700 In: 5766 [I.V.:2960.9; Blood:919; IV Piggyback:1886.1] Out: 4400 [Urine:3260; Blood:800; Chest Tube:340]  Intake/Output this shift: Total I/O In: -  Out: 230 [Urine:150; Chest Tube:80]  Current Meds: Scheduled Meds:  acetaminophen  1,000 mg Oral Q6H   Or   acetaminophen (TYLENOL) oral liquid 160 mg/5 mL  1,000 mg Per Tube Q6H   aspirin EC  325 mg Oral Daily   Or   aspirin  324 mg Per Tube Daily   atorvastatin  80 mg Oral Daily   bisacodyl  10 mg Oral Daily   Or   bisacodyl  10 mg Rectal  Daily   Chlorhexidine Gluconate Cloth  6 each Topical Daily   docusate sodium  200 mg Oral Daily   furosemide  20 mg Intravenous BID   insulin aspart  0-24 Units Subcutaneous Q4H   mouth rinse  15 mL Mouth Rinse BID   metoprolol tartrate  12.5 mg Oral BID   Or   metoprolol tartrate  12.5 mg Per Tube BID   [START ON 04/08/2021] pantoprazole  40 mg Oral Daily   sodium chloride flush  3 mL Intravenous Q12H   Continuous Infusions:  sodium chloride 10 mL/hr at 04/07/21 0700   sodium chloride 250 mL (04/07/21 0604)   sodium chloride 20 mL/hr at 04/06/21 1415   albumin human 12.5 g (04/06/21 1614)    ceFAZolin (ANCEF) IV 2 g (04/07/21 0603)   dexmedetomidine (PRECEDEX) IV infusion Stopped (04/07/21 0600)   insulin 1.2 Units/hr (04/07/21 0700)   lactated ringers     lactated ringers Stopped (04/06/21 1516)   lactated ringers 10 mL/hr at 04/07/21 0700   phenylephrine (NEO-SYNEPHRINE) Adult infusion Stopped (04/06/21 1439)   PRN Meds:.sodium chloride, albumin human, dextrose, fentaNYL (SUBLIMAZE) injection, influenza vaccine adjuvanted, lactated ringers, metoprolol tartrate, ondansetron (ZOFRAN) IV, oxyCODONE, sodium chloride flush, traMADol  General appearance: alert, cooperative, and mild distress Neurologic: intact Heart: SR / ST.  Lungs: breath  sounds shallow but clear. CXR showing no unexpected changes. Left basilar ATX, small effusion. CT drainage 276ml over night and about 51ml this morning.   Abdomen: soft, no tenderness, absent bowel sounds. Extremities: all warm and well perfused. RLE EVH incision is covered with a dry dressing.  Wound: the sternotomy incision is covered with an Aquacel dressing.  Lab Results: CBC: Recent Labs    04/06/21 2038 04/07/21 0402  WBC 16.2* 15.5*  HGB 10.6* 9.9*  HCT 30.5* 29.7*  PLT 102* 96*   BMET:  Recent Labs    04/06/21 2038 04/07/21 0402  NA 136 136  K 4.3 4.6  CL 107 106  CO2 22 24  GLUCOSE 142* 120*  BUN 13 12  CREATININE  0.77 0.87  CALCIUM 8.0* 8.4*    CMET: Lab Results  Component Value Date   WBC 15.5 (H) 04/07/2021   HGB 9.9 (L) 04/07/2021   HCT 29.7 (L) 04/07/2021   PLT 96 (L) 04/07/2021   GLUCOSE 120 (H) 04/07/2021   CHOL 327 (H) 03/30/2021   TRIG 221 (H) 03/30/2021   HDL 51 03/30/2021   LDLCALC 232 (H) 03/30/2021   ALT 28 03/30/2021   AST 36 03/30/2021   NA 136 04/07/2021   K 4.6 04/07/2021   CL 106 04/07/2021   CREATININE 0.87 04/07/2021   BUN 12 04/07/2021   CO2 24 04/07/2021   TSH 2.84 01/28/2021   INR 1.3 (H) 04/06/2021   HGBA1C 6.2 (H) 03/30/2021      PT/INR:  Recent Labs    04/06/21 1435  LABPROT 15.9*  INR 1.3*   Radiology: DG Chest Port 1 View  Result Date: 04/06/2021 CLINICAL DATA:  Post CABG EXAM: PORTABLE CHEST 1 VIEW COMPARISON:  Portable exam 1440 hours compared to 04/01/2021 FINDINGS: Tip of endotracheal tube projects 2.2 cm above carina. Nasogastric tube extends into stomach. Mediastinal drain and LEFT thoracostomy tube present. Epicardial pacing wires noted. RIGHT jugular Swan-Ganz catheter tip projects over RIGHT pulmonary artery. Prominent cardiac silhouette post CABG. Minimal LEFT basilar atelectasis. No acute infiltrate or pneumothorax. IMPRESSION: Postoperative changes as above. Electronically Signed   By: Lavonia Dana M.D.   On: 04/06/2021 14:45     Assessment/Plan: S/P Procedure(s) (LRB): CORONARY ARTERY BYPASS GRAFTING (CABG) X 3,ON PUMP, USING LEFT INTERNAL MAMMARY ARTERY AND RIGHT ENDOSCOPIC GREATER SAPHENOUS VEIN CONDUITS (N/A) TRANSESOPHAGEAL ECHOCARDIOGRAM (TEE) (N/A) ENDOVEIN HARVEST OF GREATER SAPHENOUS VEIN (Right) APPLICATION OF CELL SAVER  -POD-1 CABG x 3 for left main coronary stenosis presenting with STEMI, EF 35%  post recent COVID infection.Stable hemodynamics and cardiac rhythm on milrinone. CoOx 64 this morning.   D/C monitoring lines. Mobilize.   -PULM- Oxygenating well on 4Lnc O2. CXR showing expected post-op changes. Work on pulmonary  hygiene, leave the chest tubes for drainage.   -HEME- mild expected acute blood loss anemia-CT drainage is tapering off. Monitor.   -ENDO-no h/o DM. CBG's well controlled with insulin drip since surgery. Transition to SSI.   -RENAL-normal renal function. Mild volume excess with Wt. ~4kg+. Begin diuresis with IV Lasix.   Antony Odea, PA-C [phone] 04/07/2021 8:50 AM  Add toradol for pain control Diureses and mobilize  patient examined and medical record reviewed,agree with above note. Meredith Guerra 04/07/2021

## 2021-04-07 NOTE — Hospital Course (Addendum)
History of Present Illness:       Monroe Toure is a 71 yo obese female with history of HTN, hypothyroidism, Depression,  and Asthma.  She also has a recent diagnosis of COVID 19, testing postitive on 1/5.  She developed complaints of sudden onset chest pain with associated nausea, shortness of breath, diaphoresis.  The pain also radiated to his left arm and jaw.  EKG showed anterolateral ST elevation.  She was transferred emergently to [facility] for further workup.  She was taken to the catheterization lab and was found to have LM disease.  She was started on Heparin and admitted to the ICU for further care.  Cardiothoracic consultation is being requested for coronary bypass grafting procedure.  The patient developed fever with severe URI symptoms.  She continued to feel poorly and ultimately was tested for COVID and was positive.  She has been vaccinated with Moderna x 3.  The patient was treated with anti-viral.  The patient is currently chest pain free.  She denies history of smoking.  She is not a diabetic.  Prior to the recent COVID illness and chest pain the patient was able to do whatever she needs to do.  The patient also is currently being worked up by GI for difficulty swallowing/clearing food after eating.   After review of all the clinical data, Dr. Maren Beach offered coronary bypass grafting to Ms. Toso once she had sufficiently recovered from the acute phase of the COVID infection.  [name] decided to proceed with appropriate work-up in preparation for surgery.  Course in Hospital: Ms. Ector remained on IV heparin and nitroglycerin.  Cardiac status remained stable.  A cardiac MRI was obtained that showed improved left ventricular function since the earlier echocardiogram with good myocardial viability demonstrated.  The troponin level was shown to be trending down as well.  Serial chest x-rays showed improving lung fields with resolving COVID pneumonitis.  Carotid Dopplers showed no  obstructive cerebrovascular disease. On 04/06/2020, Ms. Noller was taken to the operating room where three-vessel coronary bypass grafting was carried out without complication.  Following the procedure, she separated from cardiopulmonary bypass without difficulty on milrinone infusion.  She was transferred to the surgical ICU in stable condition.  She was weaned from the ventilator during the afternoon following surgery and extubated per protocol around 6 PM.  Vital signs and hemodynamics remained satisfactory.  Maintain a stable sinus rhythm.  The monitoring lines were removed on the first postoperative day.  Chest tubes were left in place for drainage on the first post-op day but were removed on the second day.  Diet and activity were advanced per protocol and she made good progress. She was ready for transfer to[facility] on post-op day 2.  He did develop postoperative atrial fibrillation with RVR and was placed on intravenous amiodarone and continued on beta-blockers.  She has been chemically cardioverted to sinus rhythm with this regimen.  Pacer wires were removed on postoperative day 5.  She does have a postoperative leukocytosis which has shown a steady trend downward felt most likely to be related to her recent COVID infection as well as postoperative reactive inflammation from the surgery.  Oxygen has been weaned and she maintains good saturations on room air.  Incisions are healing well without evidence of infection.  She is tolerating diet.  She has maintained normal renal function.  She has a mild expected acute blood loss anemia which is stable.  Overall, at the time of  discharge the patient is felt to be quite stable.  She will be started on DAPT therapy with STEMI on presentation.

## 2021-04-08 ENCOUNTER — Inpatient Hospital Stay (HOSPITAL_COMMUNITY): Payer: Medicare Other

## 2021-04-08 LAB — GLUCOSE, CAPILLARY
Glucose-Capillary: 134 mg/dL — ABNORMAL HIGH (ref 70–99)
Glucose-Capillary: 145 mg/dL — ABNORMAL HIGH (ref 70–99)
Glucose-Capillary: 121 mg/dL — ABNORMAL HIGH (ref 70–99)
Glucose-Capillary: 114 mg/dL — ABNORMAL HIGH (ref 70–99)
Glucose-Capillary: 145 mg/dL — ABNORMAL HIGH (ref 70–99)

## 2021-04-08 LAB — BASIC METABOLIC PANEL
Anion gap: 7 (ref 5–15)
BUN: 14 mg/dL (ref 8–23)
CO2: 27 mmol/L (ref 22–32)
Calcium: 9 mg/dL (ref 8.9–10.3)
Chloride: 100 mmol/L (ref 98–111)
Creatinine, Ser: 0.91 mg/dL (ref 0.44–1.00)
GFR, Estimated: >60 mL/min (ref >60)
Glucose, Bld: 132 mg/dL — ABNORMAL HIGH (ref 70–99)
Potassium: 4 mmol/L (ref 3.5–5.1)
Sodium: 134 mmol/L — ABNORMAL LOW (ref 135–145)

## 2021-04-08 LAB — CBC
HCT: 32.5 % — ABNORMAL LOW (ref 36.0–46.0)
Hemoglobin: 10.5 g/dL — ABNORMAL LOW (ref 12.0–15.0)
MCH: 29.4 pg (ref 26.0–34.0)
MCHC: 32.3 g/dL (ref 30.0–36.0)
MCV: 91 fL (ref 80.0–100.0)
Platelets: 128 10*3/uL — ABNORMAL LOW (ref 150–400)
RBC: 3.57 MIL/uL — ABNORMAL LOW (ref 3.87–5.11)
RDW: 14.9 % (ref 11.5–15.5)
WBC: 21.1 10*3/uL — ABNORMAL HIGH (ref 4.0–10.5)
nRBC: 0 % (ref 0.0–0.2)

## 2021-04-08 IMAGING — DX DG CHEST 1V PORT
1 series · 1 of 1 positions shown · non-contrast
Comparison: Portable chest [DATE] and earlier.

CLINICAL DATA: 70-year-old female status post CABG postoperative
day 2.

EXAM:
PORTABLE CHEST 1 VIEW

[chest]
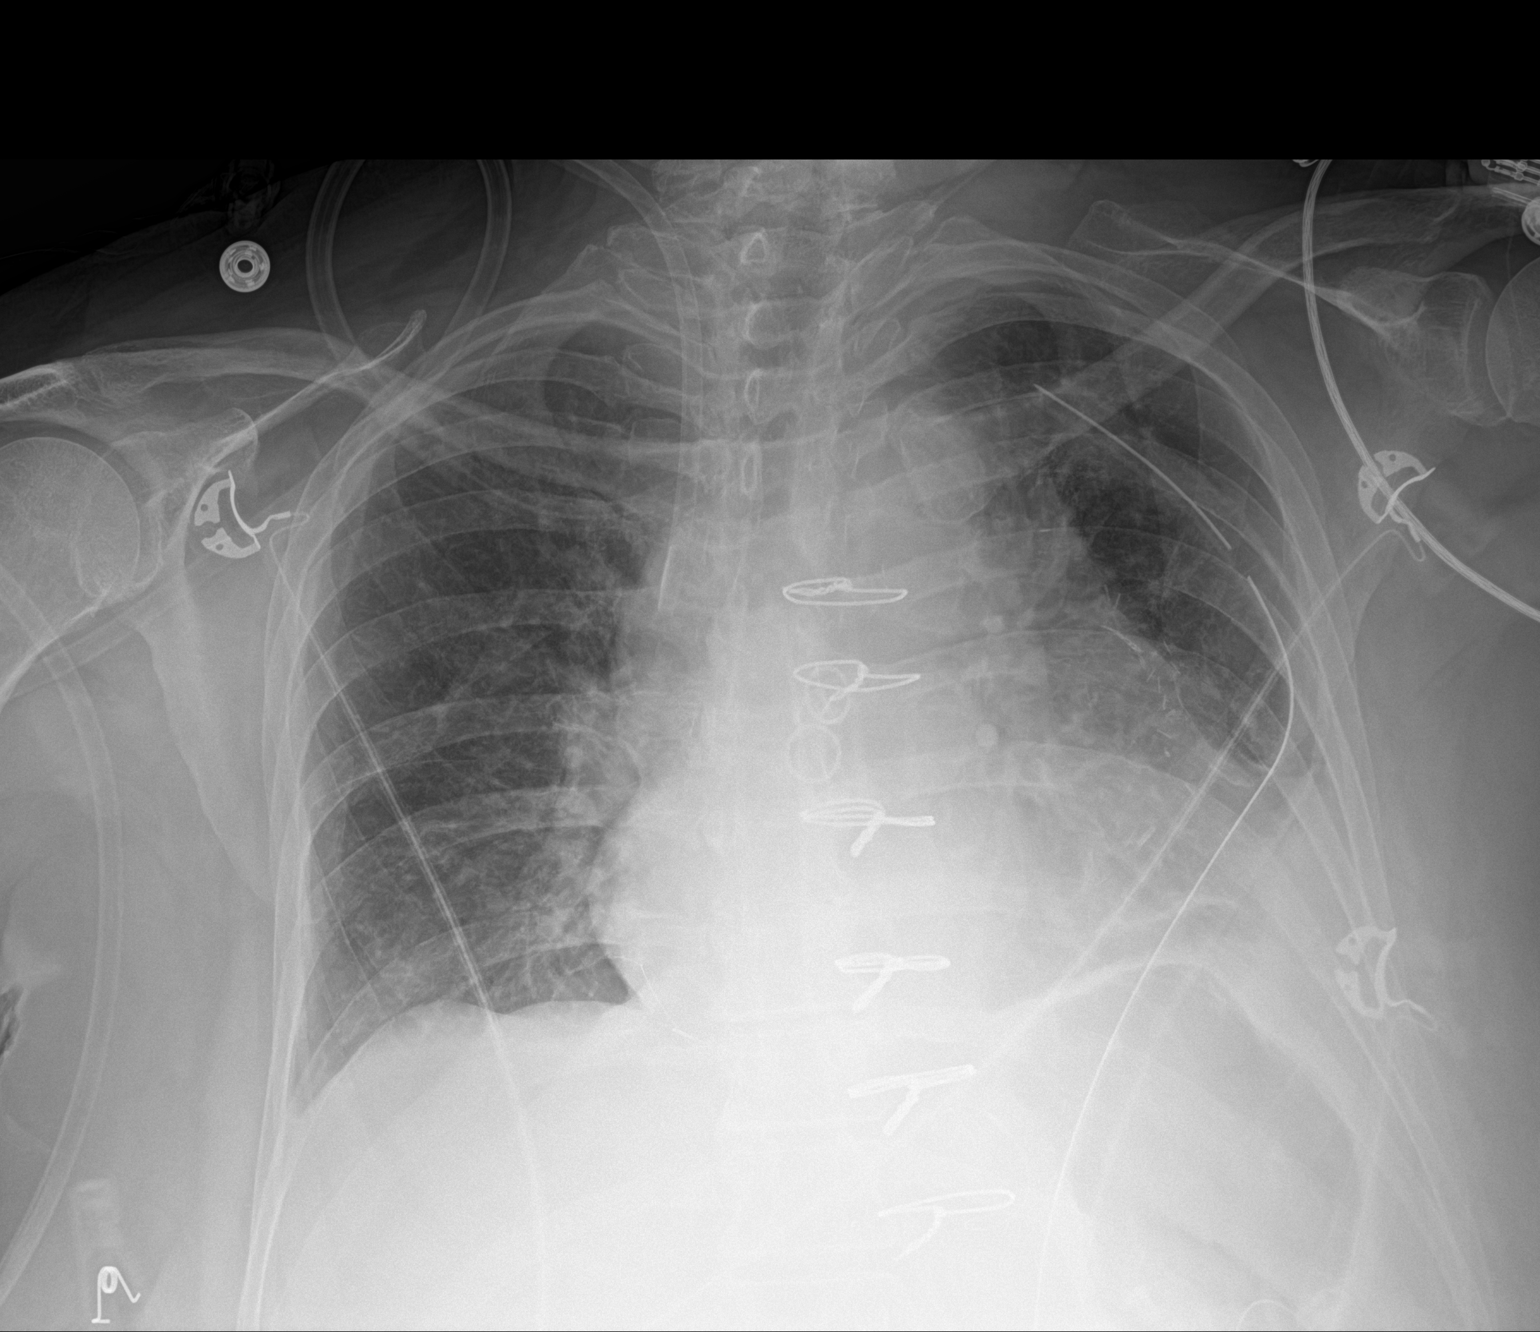

[1 of 1 positions shown; findings below may reference images not displayed]

FINDINGS: Portable AP semi upright view at [4A] hours. Swan-Ganz catheter
removed. Right IJ introducer sheath remains. Stable left chest tube.
No pneumothorax.

Mildly improved lung volumes. Ongoing confluent opacity at the left
costophrenic angle. Stable cardiac size and mediastinal contours.
Prior CABG. No pulmonary edema. Right lung appears negative. Visible
bowel-gas within normal limits.
IMPRESSION: 1. Swan-Ganz catheter removed. Stable left chest tube. No
pneumothorax.
2. Mildly improved lung volumes. Ongoing left lung base atelectasis
and/or effusion. No pulmonary edema.

## 2021-04-08 MED ORDER — METOPROLOL TARTRATE 5 MG/5ML IV SOLN
2.5000 mg | Freq: Once | INTRAVENOUS | Status: AC
  Administered 2021-04-08: 2.5 mg via INTRAVENOUS
  Filled 2021-04-08: qty 5

## 2021-04-08 MED ORDER — INSULIN ASPART 100 UNIT/ML IJ SOLN
0.0000 [IU] | Freq: Three times a day (TID) | INTRAMUSCULAR | Status: DC
  Administered 2021-04-08 (×2): 2 [IU] via SUBCUTANEOUS

## 2021-04-08 MED ORDER — AMIODARONE LOAD VIA INFUSION
150.0000 mg | Freq: Once | INTRAVENOUS | Status: AC
  Administered 2021-04-08: 150 mg via INTRAVENOUS
  Filled 2021-04-08: qty 83.34

## 2021-04-08 MED ORDER — SODIUM CHLORIDE 0.9 % IV SOLN: 250.0000 mL | INTRAVENOUS | Status: DC | PRN

## 2021-04-08 MED ORDER — SODIUM CHLORIDE 0.9% FLUSH
3.0000 mL | INTRAVENOUS | Status: DC | PRN
Start: 2021-04-08 — End: 2021-04-12

## 2021-04-08 MED ORDER — SODIUM CHLORIDE 0.9% FLUSH
3.0000 mL | Freq: Two times a day (BID) | INTRAVENOUS | Status: DC
  Administered 2021-04-08 – 2021-04-12 (×8): 3 mL via INTRAVENOUS

## 2021-04-08 MED ORDER — AMIODARONE HCL IN DEXTROSE 360-4.14 MG/200ML-% IV SOLN
60.0000 mg/h | INTRAVENOUS | Status: AC
  Administered 2021-04-08 (×2): 60 mg/h via INTRAVENOUS
  Filled 2021-04-08 (×2): qty 200

## 2021-04-08 MED ORDER — AMIODARONE HCL IN DEXTROSE 360-4.14 MG/200ML-% IV SOLN
30.0000 mg/h | INTRAVENOUS | Status: AC
  Administered 2021-04-09 (×3): 30 mg/h via INTRAVENOUS
  Filled 2021-04-08 (×3): qty 200

## 2021-04-08 MED ORDER — ~~LOC~~ CARDIAC SURGERY, PATIENT & FAMILY EDUCATION: Freq: Once | Status: AC

## 2021-04-08 MED ORDER — CARBAMIDE PEROXIDE 6.5 % OT SOLN
5.0000 [drp] | Freq: Two times a day (BID) | OTIC | Status: DC
  Administered 2021-04-08 – 2021-04-12 (×7): 5 [drp] via OTIC
  Filled 2021-04-08: qty 15

## 2021-04-08 MED FILL — Albumin, Human Inj 5%: INTRAVENOUS | Qty: 250 | Status: AC

## 2021-04-08 MED FILL — Heparin Sodium (Porcine) Inj 1000 Unit/ML: Qty: 1000 | Status: AC

## 2021-04-08 MED FILL — Electrolyte-R (PH 7.4) Solution: INTRAVENOUS | Qty: 5000 | Status: AC

## 2021-04-08 MED FILL — Magnesium Sulfate Inj 50%: INTRAMUSCULAR | Qty: 10 | Status: AC

## 2021-04-08 MED FILL — Sodium Chloride IV Soln 0.9%: INTRAVENOUS | Qty: 4000 | Status: AC

## 2021-04-08 MED FILL — Heparin Sodium (Porcine) Inj 1000 Unit/ML: INTRAMUSCULAR | Qty: 10 | Status: AC

## 2021-04-08 MED FILL — Potassium Chloride Inj 2 mEq/ML: INTRAVENOUS | Qty: 40 | Status: AC

## 2021-04-08 MED FILL — Calcium Chloride Inj 10%: INTRAVENOUS | Qty: 10 | Status: AC

## 2021-04-08 MED FILL — Lidocaine HCl (Cardiac) IV PF Soln 100 MG/5ML (2%): INTRAVENOUS | Qty: 5 | Status: AC

## 2021-04-08 MED FILL — Sodium Bicarbonate IV Soln 8.4%: INTRAVENOUS | Qty: 50 | Status: AC

## 2021-04-08 NOTE — Progress Notes (Signed)
Chaplain Melvenia Beam met with patient at bed side regarding the completion of Advance Directive. Patient acknowledged that preparing A.D. was important to her and something that she would like to have completed. Chaplain explained that A.D. is only used when patient is unable to make medical decisions regarding medical care for herself providing instruction to the doctors and her agents of how patient wishes to be cared for.  Patient stated after careful thought that she plans to name her husband and daughter as her Medical Power of Attorney.   Chaplain explained the importance of having conversation with her husband and daughter  prior to naming them as her agents. So they are aware of her wishes.   Chaplain provided patient a blank copy and provided patient instruction for the completion of Parts A and B of the A.D. Chaplain instructed patient to have the nurse contact [facility] [facility] when she has completed Parts A and B, so that we can arrange for Notary and witnesses to assist with completion of Part C.   Patient was extremely appreciative of the consultation. Chaplain will follow up with patient in order to arrange for witnesses and Notary assistance. Chaplain Morene Crocker may be contacted at [phone].

## 2021-04-08 NOTE — Progress Notes (Addendum)
[facility] DAILY ICU PROGRESS NOTE                   [address]            [place] [address]          [phone]   2 Days Post-Op Procedure(s) (LRB): CORONARY ARTERY BYPASS GRAFTING (CABG) X 3,ON PUMP, USING LEFT INTERNAL MAMMARY ARTERY AND RIGHT ENDOSCOPIC GREATER SAPHENOUS VEIN CONDUITS (N/A) TRANSESOPHAGEAL ECHOCARDIOGRAM (TEE) (N/A) ENDOVEIN HARVEST OF GREATER SAPHENOUS VEIN (Right) APPLICATION OF CELL SAVER  Total Length of Stay:  LOS: 10 days   Subjective:  Up in the bedside chair. Tolerating clear liquids. Mentions right ear feels "stopped up" and she is unable to hear as normal.  Also has some numbness in her left hand.  Objective: Vital signs in last 24 hours: Temp:  [97.7 F (36.5 C)-98.7 F (37.1 C)] 98.7 F (37.1 C) (01/18 2337) Pulse Rate:  [69-99] 75 (01/19 0700) Cardiac Rhythm: Normal sinus rhythm (01/19 0000) Resp:  [15-22] 15 (01/19 0700) BP: (80-132)/(49-90) 111/70 (01/19 0700) SpO2:  [95 %-100 %] 98 % (01/19 0700) Arterial Line BP: (91-141)/(71-92) 91/88 (01/18 1031) Weight:  [79.9 kg] 79.9 kg (01/19 0600)  Filed Weights   04/06/21 0500 04/07/21 0613 04/08/21 0600  Weight: 73.9 kg 81.1 kg 79.9 kg    Weight change: -1.2 kg   Hemodynamic parameters for last 24 hours: PAP: (37-47)/(19-25) 37/19 CO:  [3.8 L/min-4 L/min] 3.8 L/min CI:  [2.2 L/min/m2-2.3 L/min/m2] 2.2 L/min/m2  Intake/Output from previous day: 01/18 0701 - 01/19 0700 In: 218.6 [I.V.:218.6] Out: 2680 [Urine:2390; Chest Tube:290]  Intake/Output this shift: No intake/output data recorded.  Current Meds: Scheduled Meds:  acetaminophen  1,000 mg Oral Q6H   Or   acetaminophen (TYLENOL) oral liquid 160 mg/5 mL  1,000 mg Per Tube Q6H   aspirin EC  325 mg Oral Daily   Or   aspirin  324 mg Per Tube Daily   atorvastatin  80 mg Oral Daily   bisacodyl  10 mg Oral Daily   Or   bisacodyl  10 mg Rectal Daily   Chlorhexidine Gluconate Cloth  6 each Topical Daily    docusate sodium  200 mg Oral Daily   furosemide  20 mg Intravenous BID   insulin aspart  0-24 Units Subcutaneous TID WC & HS   mouth rinse  15 mL Mouth Rinse BID   metoprolol tartrate  12.5 mg Oral BID   Or   metoprolol tartrate  12.5 mg Per Tube BID   pantoprazole  40 mg Oral Daily   sodium chloride flush  3 mL Intravenous Q12H   Continuous Infusions:  sodium chloride Stopped (04/07/21 1047)   sodium chloride 250 mL (04/07/21 0604)   sodium chloride 10 mL/hr at 04/08/21 0700   dexmedetomidine (PRECEDEX) IV infusion Stopped (04/07/21 0600)   lactated ringers     lactated ringers Stopped (04/06/21 1516)   lactated ringers Stopped (04/07/21 1707)   PRN Meds:.sodium chloride, fentaNYL (SUBLIMAZE) injection, influenza vaccine adjuvanted, lactated ringers, metoprolol tartrate, ondansetron (ZOFRAN) IV, oxyCODONE, sodium chloride flush, traMADol  General appearance: alert, cooperative, no mild distress Neurologic: intact Heart: SR / ST.  Lungs: breath sounds clear. CXR showing stable left basilar ATX, small effusion. CT drainage minimal. Abdomen: soft, no tenderness Wound: the sternotomy incision is covered with an Aquacel dressing.  Lab Results: CBC: Recent Labs    04/07/21 1726 04/08/21 0322  WBC 25.0* 21.1*  HGB 11.3* 10.5*  HCT 34.1* 32.5*  PLT 141* 128*    BMET:  Recent Labs    04/07/21 1726 04/08/21 0322  NA 134* 134*  K 4.0 4.0  CL 99 100  CO2 24 27  GLUCOSE 136* 132*  BUN 13 14  CREATININE 0.96 0.91  CALCIUM 9.3 9.0     CMET: Lab Results  Component Value Date   WBC 21.1 (H) 04/08/2021   HGB 10.5 (L) 04/08/2021   HCT 32.5 (L) 04/08/2021   PLT 128 (L) 04/08/2021   GLUCOSE 132 (H) 04/08/2021   CHOL 327 (H) 03/30/2021   TRIG 221 (H) 03/30/2021   HDL 51 03/30/2021   LDLCALC 232 (H) 03/30/2021   ALT 28 03/30/2021   AST 36 03/30/2021   NA 134 (L) 04/08/2021   K 4.0 04/08/2021   CL 100 04/08/2021   CREATININE 0.91 04/08/2021   BUN 14 04/08/2021   CO2  27 04/08/2021   TSH 2.84 01/28/2021   INR 1.3 (H) 04/06/2021   HGBA1C 6.2 (H) 03/30/2021      PT/INR:  Recent Labs    04/06/21 1435  LABPROT 15.9*  INR 1.3*    Radiology: No results found.   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 Milrinone off. Pain control better.  D/C chest tubes.  [place] well. CXR stable. Needs more work on pulmonary hygiene.  -HEME- mild expected acute blood loss anemia. Minimal losses past 24 hours.   -ENDO-no h/o DM. CBG's well controlled . Continue  SSI.   -RENAL-normal renal function. Mild volume excess with Wt. ~4kg+. Continue diuresis.   -Disposition-transfer to 4E Progressive Care when bed available. Anticipate eventual discharge to home.   Antony Odea, PA-C [phone] 04/08/2021 8:05 AM  Making progress and ready for progressive care NSR with mild atelectasis on CXR  patient examined and medical record reviewed,agree with above note. Dahlia Byes 04/08/2021

## 2021-04-08 NOTE — Progress Notes (Signed)
Mobility Specialist: Progress Note   04/08/21 1734  Mobility  Activity Refused mobility   Pt refused mobility d/t feeling tired from transfer onto the unit. Will f/u tomorrow.  [facility] Elliot Meldrum Mobility Specialist Mobility Specialist 4 [place]: [phone] Mobility Specialist 2 [place] and 6 [place]: [phone]

## 2021-04-08 NOTE — Plan of Care (Signed)
°  Problem: Respiratory: Goal: Respiratory status will improve Outcome: Progressing   Problem: Cardiac: Goal: Will achieve and/or maintain hemodynamic stability Outcome: Progressing   Problem: Urinary Elimination: Goal: Ability to achieve and maintain adequate renal perfusion and functioning will improve Outcome: Progressing   Problem: Activity: Goal: Risk for activity intolerance will decrease Outcome: Progressing   Problem: Education: Goal: Will demonstrate proper wound care and an understanding of methods to prevent future damage Outcome: Progressing   

## 2021-04-08 NOTE — Progress Notes (Addendum)
Pt went into aflutter HR in 150's, BP stable 124/75. MD paged, orders will be carried through.  Kalman Jewels, RN 04/08/2021 6:30 PM

## 2021-04-09 ENCOUNTER — Inpatient Hospital Stay (HOSPITAL_COMMUNITY): Payer: Medicare Other

## 2021-04-09 LAB — GLUCOSE, CAPILLARY
Glucose-Capillary: 135 mg/dL — ABNORMAL HIGH (ref 70–99)
Glucose-Capillary: 108 mg/dL — ABNORMAL HIGH (ref 70–99)

## 2021-04-09 LAB — TYPE AND SCREEN
ABO/RH(D): O POS
Antibody Screen: NEGATIVE
Unit division: 0
Unit division: 0
Unit division: 0
Unit division: 0
Unit division: 0
Unit division: 0

## 2021-04-09 LAB — CBC
HCT: 32.2 % — ABNORMAL LOW (ref 36.0–46.0)
Hemoglobin: 10.5 g/dL — ABNORMAL LOW (ref 12.0–15.0)
MCH: 29.2 pg (ref 26.0–34.0)
MCHC: 32.6 g/dL (ref 30.0–36.0)
MCV: 89.4 fL (ref 80.0–100.0)
Platelets: 164 10*3/uL (ref 150–400)
RBC: 3.6 MIL/uL — ABNORMAL LOW (ref 3.87–5.11)
RDW: 14.9 % (ref 11.5–15.5)
WBC: 20.9 10*3/uL — ABNORMAL HIGH (ref 4.0–10.5)
nRBC: 0 % (ref 0.0–0.2)

## 2021-04-09 LAB — BPAM RBC
Blood Product Expiration Date: 202302032359
Blood Product Expiration Date: 202302032359
Blood Product Expiration Date: 202302112359
Blood Product Expiration Date: 202302122359
Blood Product Expiration Date: 202302122359
Blood Product Expiration Date: 202302122359
ISSUE DATE / TIME: 202301171032
ISSUE DATE / TIME: 202301171032
ISSUE DATE / TIME: 202301171057
ISSUE DATE / TIME: 202301171057
Unit Type and Rh: 5100
Unit Type and Rh: 5100
Unit Type and Rh: 5100
Unit Type and Rh: 5100
Unit Type and Rh: 5100
Unit Type and Rh: 5100

## 2021-04-09 LAB — BASIC METABOLIC PANEL
Anion gap: 8 (ref 5–15)
BUN: 11 mg/dL (ref 8–23)
CO2: 31 mmol/L (ref 22–32)
Calcium: 9.1 mg/dL (ref 8.9–10.3)
Chloride: 94 mmol/L — ABNORMAL LOW (ref 98–111)
Creatinine, Ser: 0.84 mg/dL (ref 0.44–1.00)
GFR, Estimated: >60 mL/min (ref >60)
Glucose, Bld: 137 mg/dL — ABNORMAL HIGH (ref 70–99)
Potassium: 4 mmol/L (ref 3.5–5.1)
Sodium: 133 mmol/L — ABNORMAL LOW (ref 135–145)

## 2021-04-09 LAB — TSH: TSH: 6.685 u[IU]/mL — ABNORMAL HIGH (ref 0.350–4.500)

## 2021-04-09 IMAGING — DX DG CHEST 1V PORT
1 series · 1 of 1 positions shown · non-contrast
Comparison: [DATE] and earlier.

CLINICAL DATA: 70-year-old female postoperative day 3 status post
CABG.

EXAM:
PORTABLE CHEST 1 VIEW

[chest]
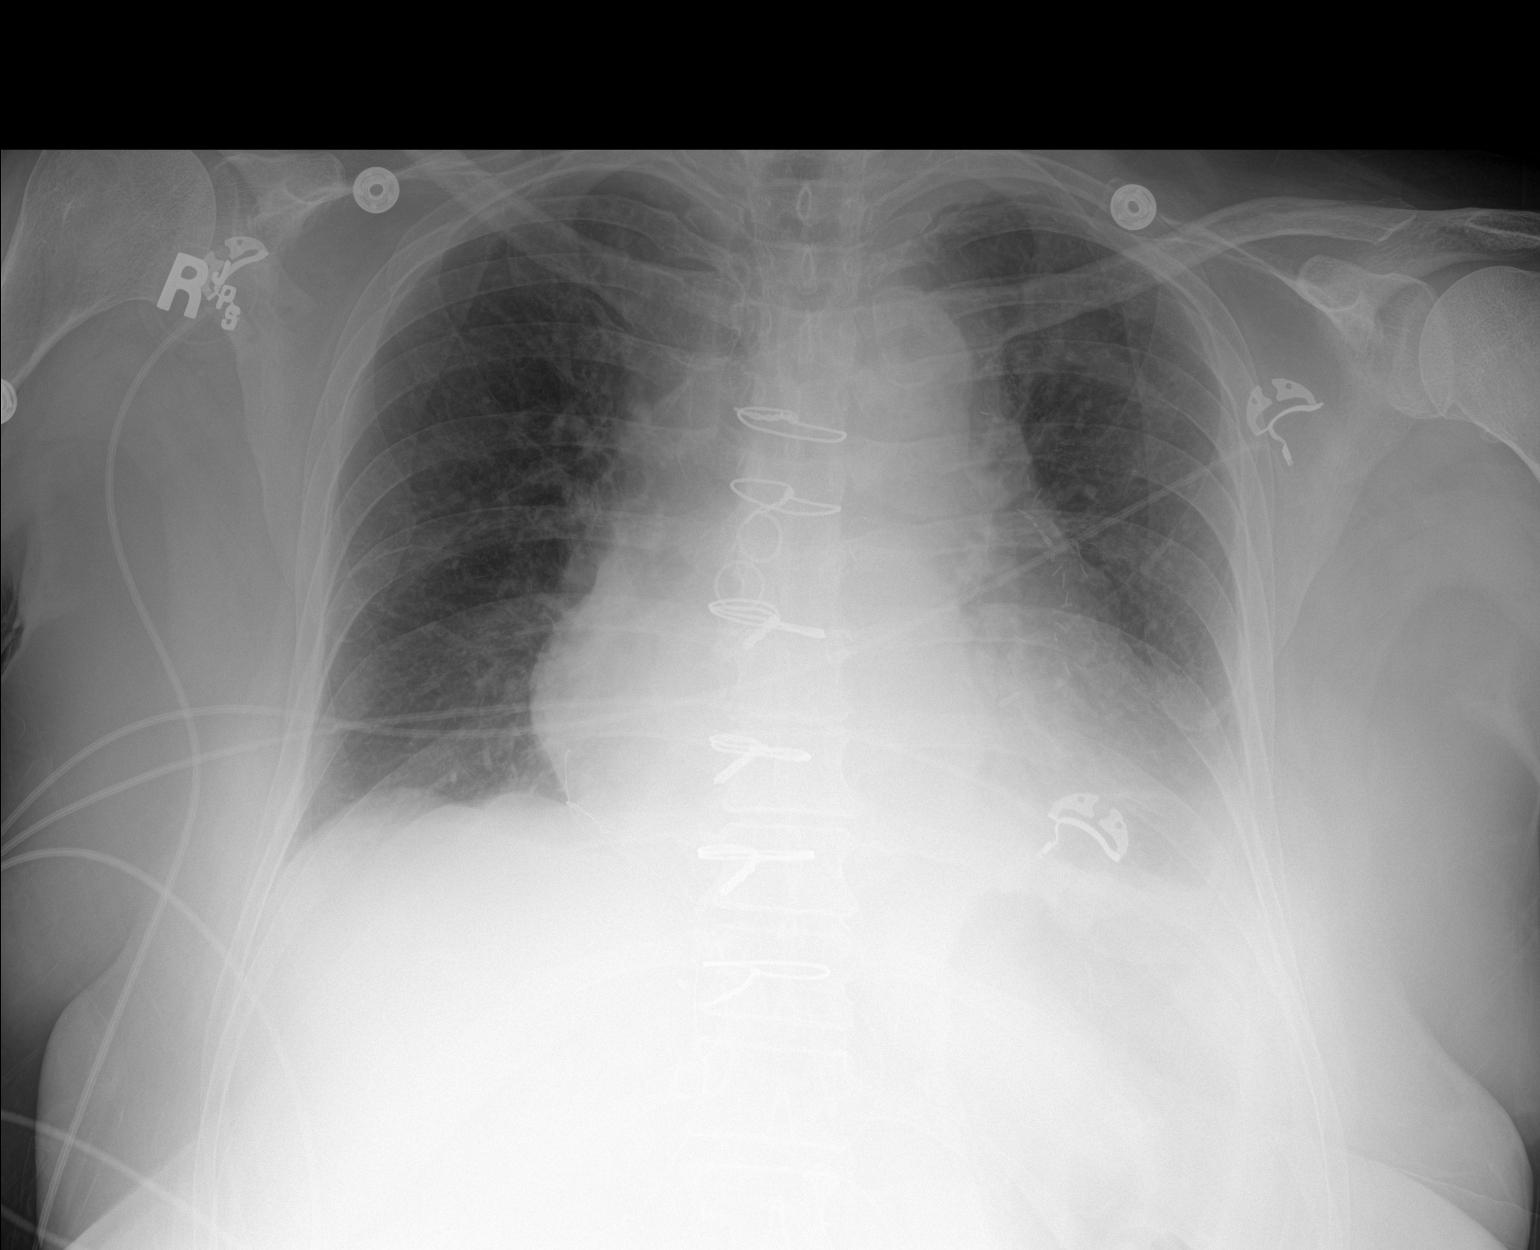

[1 of 1 positions shown; findings below may reference images not displayed]

FINDINGS: Portable AP semi upright view at [I6] hours. Left chest tube and
right IJ introducer sheath removed. Epicardial pacer wires remain.
No pneumothorax. Stable cardiac size and mediastinal contours. No
pulmonary edema. Improved left lung base ventilation but not
completely resolved patchy opacity. Right lung appears negative.
Stable visualized osseous structures. Visible bowel gas pattern
stable and within normal limits.
IMPRESSION: 1. Left chest tube and right IJ introducer sheath removed. No
pneumothorax.
2. Regressed but not resolved opacity at the left lung base,
probable small pleural effusion.

## 2021-04-09 MED ORDER — MORPHINE SULFATE (PF) 2 MG/ML IV SOLN
INTRAVENOUS | Status: AC
  Administered 2021-04-09: 2 mg via INTRAVENOUS
  Filled 2021-04-09: qty 1

## 2021-04-09 MED ORDER — CLONAZEPAM 0.25 MG PO TBDP
0.5000 mg | ORAL_TABLET | Freq: Every day | ORAL | Status: DC | PRN
  Administered 2021-04-09 – 2021-04-11 (×3): 0.5 mg via ORAL
  Filled 2021-04-09 (×3): qty 2

## 2021-04-09 MED ORDER — DULOXETINE HCL 30 MG PO CPEP
30.0000 mg | ORAL_CAPSULE | Freq: Every day | ORAL | Status: DC
  Administered 2021-04-09 – 2021-04-12 (×4): 30 mg via ORAL
  Filled 2021-04-09 (×4): qty 1

## 2021-04-09 MED ORDER — AMIODARONE HCL 200 MG PO TABS
400.0000 mg | ORAL_TABLET | Freq: Two times a day (BID) | ORAL | Status: DC
  Administered 2021-04-09 – 2021-04-12 (×6): 400 mg via ORAL
  Filled 2021-04-09 (×7): qty 2

## 2021-04-09 MED ORDER — POTASSIUM CHLORIDE CRYS ER 20 MEQ PO TBCR
20.0000 meq | EXTENDED_RELEASE_TABLET | Freq: Every day | ORAL | Status: DC
  Administered 2021-04-09 – 2021-04-12 (×4): 20 meq via ORAL
  Filled 2021-04-09 (×4): qty 1

## 2021-04-09 NOTE — Progress Notes (Signed)
CARDIAC REHAB PHASE I   PRE:  Rate/Rhythm: 83 SR    BP: sitting 119/62    SaO2: 95 1L  MODE:  Ambulation: 330 ft   POST:  Rate/Rhythm: 90 SR    BP: sitting 136/65     SaO2: 100 1L  Pt c/o fatigue but willing to walk. Mod assist to get to EOB. Stood independently and walked with RW. Slow and steady. No major c/o, stated pain 2/10. Return to bed after walk, VSS. Encouraged IS.  [phone]   Harriet Masson CES, ACSM 04/09/2021 3:09 PM

## 2021-04-09 NOTE — Care Management Important Message (Signed)
Important Message  Patient Details  Name: Meredith Guerra MRN: [id number] Date of Birth: [birth year]   Medicare Important Message Given:  Yes     Renie Ora 04/09/2021, 10:15 AM

## 2021-04-09 NOTE — Progress Notes (Signed)
As per report from night shift pt had "crushing" chest pain last night for which nursing administered 4mg  morphine IV.  Pt states pain has resolved this morning.  Also pt is requesting home cymbalta and klonopin be added to [facility]. [facility] to be notified/updated.

## 2021-04-09 NOTE — Progress Notes (Signed)
Mobility Specialist: Progress Note   04/09/21 1149  Mobility  Bed Position Chair  Activity Ambulated with assistance in hallway  Level of Assistance Minimal assist, patient does 75% or more  Assistive Device Front wheel walker  Distance Ambulated (ft) 110 ft  Activity Response Tolerated fair  $Mobility charge 1 Mobility   Post-Mobility: 81 HR, 130/86 (101) BP, 100% SpO2  Pt required minA to sit EOB as well as to stand. Pt c/o dizziness upon standing but resolved with distance as well as feeling weak throughout. Ambulated on 1 L/min [place] but weaned to RA after returning to the room maintaining >90%. Pt to recliner after walk with call bell in reach and family present in the room.   [facility] Anyla Israelson Mobility Specialist Mobility Specialist 4 [place]: [phone] Mobility Specialist 2 [place] and 6 [place]: [phone]

## 2021-04-09 NOTE — Progress Notes (Addendum)
[address]       [place] [address]             [phone]      3 Days Post-Op Procedure(s) (LRB): CORONARY ARTERY BYPASS GRAFTING (CABG) X 3,ON PUMP, USING LEFT INTERNAL MAMMARY ARTERY AND RIGHT ENDOSCOPIC GREATER SAPHENOUS VEIN CONDUITS (N/A) TRANSESOPHAGEAL ECHOCARDIOGRAM (TEE) (N/A) ENDOVEIN HARVEST OF GREATER SAPHENOUS VEIN (Right) APPLICATION OF CELL SAVER Subjective: Feeling ok, didn't sleep0 well+ palpitations when in afib  Objective: Vital signs in last 24 hours: Temp:  [97.8 F (36.6 C)-98.4 F (36.9 C)] 98.4 F (36.9 C) (01/20 0715) Pulse Rate:  [73-100] 74 (01/20 0715) Cardiac Rhythm: Atrial flutter;Bundle branch block (01/19 1900) Resp:  [14-22] 20 (01/20 0715) BP: (89-132)/(45-77) 128/63 (01/20 0715) SpO2:  [93 %-100 %] 100 % (01/20 0715)  Hemodynamic parameters for last 24 hours:    Intake/Output from previous day: 01/19 0701 - 01/20 0700 In: 562.3 [P.O.:250; I.V.:312.3] Out: 1980 [id number]; Chest Tube:110] Intake/Output this shift: No intake/output data recorded.  General appearance: alert, cooperative, and no distress Heart: regular rate and rhythm Lungs: mildly dim in bases Abdomen: benign Extremities: no edema Wound: incis healing well(EVH) , chest dressing in place  Lab Results: Recent Labs    04/08/21 0322 04/09/21 0155  WBC 21.1* 20.9*  HGB 10.5* 10.5*  HCT 32.5* 32.2*  PLT 128* 164   BMET:  Recent Labs    04/08/21 0322 04/09/21 0155  NA 134* 133*  K 4.0 4.0  CL 100 94*  CO2 27 31  GLUCOSE 132* 137*  BUN 14 11  CREATININE 0.91 0.84  CALCIUM 9.0 9.1    PT/INR:  Recent Labs    04/06/21 1435  LABPROT 15.9*  INR 1.3*   ABG    Component Value Date/Time   PHART 7.301 (L) 04/06/2021 1906   HCO3 23.7 04/06/2021 1906   TCO2 25 04/06/2021 1906   ACIDBASEDEF 3.0 (H) 04/06/2021 1906   O2SAT 64.1 04/07/2021 0402   CBG (last 3)  Recent Labs    04/08/21 2132 04/09/21 0241 04/09/21 0635  GLUCAP  145* 135* 108*    Meds Scheduled Meds:  acetaminophen  1,000 mg Oral Q6H   Or   acetaminophen (TYLENOL) oral liquid 160 mg/5 mL  1,000 mg Per Tube Q6H   aspirin EC  325 mg Oral Daily   Or   aspirin  324 mg Per Tube Daily   atorvastatin  80 mg Oral Daily   bisacodyl  10 mg Oral Daily   Or   bisacodyl  10 mg Rectal Daily   carbamide peroxide  5 drop Right EAR BID   Chlorhexidine Gluconate Cloth  6 each Topical Daily   docusate sodium  200 mg Oral Daily   furosemide  20 mg Intravenous BID   insulin aspart  0-24 Units Subcutaneous TID WC & HS   mouth rinse  15 mL Mouth Rinse BID   metoprolol tartrate  12.5 mg Oral BID   Or   metoprolol tartrate  12.5 mg Per Tube BID   pantoprazole  40 mg Oral Daily   sodium chloride flush  3 mL Intravenous Q12H   sodium chloride flush  3 mL Intravenous Q12H   Continuous Infusions:  sodium chloride Stopped (04/07/21 1047)   sodium chloride 250 mL (04/07/21 0604)   sodium chloride Stopped (04/08/21 0828)   sodium chloride     amiodarone 30 mg/hr (04/09/21 0421)   lactated ringers  PRN Meds:.sodium chloride, sodium chloride, influenza vaccine adjuvanted, lactated ringers, metoprolol tartrate, ondansetron (ZOFRAN) IV, oxyCODONE, sodium chloride flush, sodium chloride flush, traMADol  Xrays DG Chest Port 1 View  Result Date: 04/08/2021 CLINICAL DATA:  71 year old female status post CABG postoperative day 2. EXAM: PORTABLE CHEST 1 VIEW COMPARISON:  Portable chest 04/07/2021 and earlier. FINDINGS: Portable AP semi upright view at 0501 hours. Swan-Ganz catheter removed. Right IJ introducer sheath remains. Stable left chest tube. No pneumothorax. Mildly improved lung volumes. Ongoing confluent opacity at the left costophrenic angle. Stable cardiac size and mediastinal contours. Prior CABG. No pulmonary edema. Right lung appears negative. Visible bowel-gas within normal limits. IMPRESSION: 1. Swan-Ganz catheter removed. Stable left chest tube. No  pneumothorax. 2. Mildly improved lung volumes. Ongoing left lung base atelectasis and/or effusion. No pulmonary edema. Electronically Signed   By: Genevie Ann M.D.   On: 04/08/2021 08:28    Assessment/Plan: S/P Procedure(s) (LRB): CORONARY ARTERY BYPASS GRAFTING (CABG) X 3,ON PUMP, USING LEFT INTERNAL MAMMARY ARTERY AND RIGHT ENDOSCOPIC GREATER SAPHENOUS VEIN CONDUITS (N/A) TRANSESOPHAGEAL ECHOCARDIOGRAM (TEE) (N/A) ENDOVEIN HARVEST OF GREATER SAPHENOUS VEIN (Right) APPLICATION OF CELL SAVER  POD#3 1 afeb, VSS SBP 89-132(mostly 120"s ), afib, on amio gtt, check TSHmetoprolol 2 sats good on 2 liters [place] 3 good UOP- not weighed yet today- cont diuretic, will add K+  4 minor hyponatremia 5 normal renal fxn 6 leukocytosis is stable at 20 K, reactive, recent Covid infection as well 7 expected ABLA is stable 8 thrombocytopenia is resolved 9 BS well controlled 10 requests her klonipen and cymbalta renew- will order 11 cont pulm toilet and rehab     LOS: 11 days    John Giovanni PA-C Pager [phone] 04/09/2021    Patient walked well with rehab in the hallway Maintained sinus rhythm today so we will transition to oral amiodarone Continue IV Lasix for another 1 to 2 days  patient examined and medical record reviewed,agree with above note. Dahlia Byes 04/09/2021

## 2021-04-10 LAB — BASIC METABOLIC PANEL
Anion gap: 11 (ref 5–15)
BUN: 13 mg/dL (ref 8–23)
CO2: 30 mmol/L (ref 22–32)
Calcium: 9.1 mg/dL (ref 8.9–10.3)
Chloride: 95 mmol/L — ABNORMAL LOW (ref 98–111)
Creatinine, Ser: 0.92 mg/dL (ref 0.44–1.00)
GFR, Estimated: >60 mL/min (ref >60)
Glucose, Bld: 132 mg/dL — ABNORMAL HIGH (ref 70–99)
Potassium: 3.8 mmol/L (ref 3.5–5.1)
Sodium: 136 mmol/L (ref 135–145)

## 2021-04-10 LAB — CBC
HCT: 31.5 % — ABNORMAL LOW (ref 36.0–46.0)
Hemoglobin: 10.5 g/dL — ABNORMAL LOW (ref 12.0–15.0)
MCH: 29.7 pg (ref 26.0–34.0)
MCHC: 33.3 g/dL (ref 30.0–36.0)
MCV: 89.2 fL (ref 80.0–100.0)
Platelets: 213 10*3/uL (ref 150–400)
RBC: 3.53 MIL/uL — ABNORMAL LOW (ref 3.87–5.11)
RDW: 15.1 % (ref 11.5–15.5)
WBC: 16.8 10*3/uL — ABNORMAL HIGH (ref 4.0–10.5)
nRBC: 0 % (ref 0.0–0.2)

## 2021-04-10 LAB — MAGNESIUM: Magnesium: 1.8 mg/dL (ref 1.7–2.4)

## 2021-04-10 MED ORDER — AMIODARONE IV BOLUS ONLY 150 MG/100ML
150.0000 mg | Freq: Once | INTRAVENOUS | Status: AC
Start: 2021-04-10 — End: 2021-04-10
  Administered 2021-04-10: 150 mg via INTRAVENOUS
  Filled 2021-04-10: qty 100

## 2021-04-10 MED ORDER — LEVOTHYROXINE SODIUM 75 MCG PO TABS
37.5000 ug | ORAL_TABLET | Freq: Every day | ORAL | Status: DC
  Administered 2021-04-11 – 2021-04-12 (×2): 37.5 ug via ORAL
  Filled 2021-04-10 (×2): qty 1

## 2021-04-10 NOTE — Progress Notes (Signed)
Mobility Specialist: Progress Note   04/10/21 1703  Mobility  Activity Ambulated with assistance in hallway  Level of Assistance Contact guard assist, steadying assist  Assistive Device Front wheel walker  Distance Ambulated (ft) 130 ft  Activity Response Tolerated fair  $Mobility charge 1 Mobility   Pre-Mobility: 88 HR, 94% SpO2 Post-Mobility: 87 HR  Pt required minA to sit EOB and contact guard during ambulation. Stopped x1 for seated break d/t feeling nauseated with retching, RN present. Wheeled pt back to room and assisted back to bed with call bell in reach and family present in the room.   [facility] Dahlton Hinde Mobility Specialist Mobility Specialist 4 [place]: [phone] Mobility Specialist 2 [place] and 6 [place]: [phone]

## 2021-04-10 NOTE — Progress Notes (Addendum)
[address]       [place] [address]             [phone]      4 Days Post-Op Procedure(s) (LRB): CORONARY ARTERY BYPASS GRAFTING (CABG) X 3,ON PUMP, USING LEFT INTERNAL MAMMARY ARTERY AND RIGHT ENDOSCOPIC GREATER SAPHENOUS VEIN CONDUITS (N/A) TRANSESOPHAGEAL ECHOCARDIOGRAM (TEE) (N/A) ENDOVEIN HARVEST OF GREATER SAPHENOUS VEIN (Right) APPLICATION OF CELL SAVER Subjective: Generally feeling better  Objective: Vital signs in last 24 hours: Temp:  [97.6 F (36.4 C)-98.2 F (36.8 C)] 98.2 F (36.8 C) (01/21 0538) Pulse Rate:  [78-87] 82 (01/21 0538) Cardiac Rhythm: Normal sinus rhythm;Bundle branch block (01/21 0417) Resp:  [17-20] 20 (01/21 0538) BP: (117-136)/(55-86) 117/55 (01/21 0538) SpO2:  [90 %-100 %] 100 % (01/21 0538) Weight:  [76.2 kg-77.5 kg] 76.2 kg (01/21 0412)  Hemodynamic parameters for last 24 hours:    Intake/Output from previous day: 01/20 0701 - 01/21 0700 In: 480 [P.O.:480] Out: 2300 [Urine:2300] Intake/Output this shift: No intake/output data recorded.  General appearance: alert, cooperative, and no distress Heart: regular rate and rhythm Lungs: min dim in bases Abdomen: benign Extremities: no edema Wound: incis healing well  Lab Results: Recent Labs    04/09/21 0155 04/10/21 0055  WBC 20.9* 16.8*  HGB 10.5* 10.5*  HCT 32.2* 31.5*  PLT 164 213   BMET:  Recent Labs    04/09/21 0155 04/10/21 0055  NA 133* 136  K 4.0 3.8  CL 94* 95*  CO2 31 30  GLUCOSE 137* 132*  BUN 11 13  CREATININE 0.84 0.92  CALCIUM 9.1 9.1    PT/INR: No results for input(s): LABPROT, INR in the last 72 hours. ABG    Component Value Date/Time   PHART 7.301 (L) 04/06/2021 1906   HCO3 23.7 04/06/2021 1906   TCO2 25 04/06/2021 1906   ACIDBASEDEF 3.0 (H) 04/06/2021 1906   O2SAT 64.1 04/07/2021 0402   CBG (last 3)  Recent Labs    04/08/21 2132 04/09/21 0241 04/09/21 0635  GLUCAP 145* 135* 108*    Meds Scheduled Meds:   acetaminophen  1,000 mg Oral Q6H   Or   acetaminophen (TYLENOL) oral liquid 160 mg/5 mL  1,000 mg Per Tube Q6H   amiodarone  400 mg Oral BID   aspirin EC  325 mg Oral Daily   Or   aspirin  324 mg Per Tube Daily   atorvastatin  80 mg Oral Daily   bisacodyl  10 mg Oral Daily   Or   bisacodyl  10 mg Rectal Daily   carbamide peroxide  5 drop Right EAR BID   Chlorhexidine Gluconate Cloth  6 each Topical Daily   docusate sodium  200 mg Oral Daily   DULoxetine  30 mg Oral Daily   furosemide  20 mg Intravenous BID   mouth rinse  15 mL Mouth Rinse BID   metoprolol tartrate  12.5 mg Oral BID   Or   metoprolol tartrate  12.5 mg Per Tube BID   pantoprazole  40 mg Oral Daily   potassium chloride  20 mEq Oral Daily   sodium chloride flush  3 mL Intravenous Q12H   sodium chloride flush  3 mL Intravenous Q12H   Continuous Infusions:  sodium chloride Stopped (04/07/21 1047)   sodium chloride 250 mL (04/07/21 0604)   sodium chloride Stopped (04/08/21 0828)   sodium chloride     lactated ringers     PRN Meds:.sodium chloride,  sodium chloride, clonazePAM, influenza vaccine adjuvanted, lactated ringers, metoprolol tartrate, ondansetron (ZOFRAN) IV, oxyCODONE, sodium chloride flush, sodium chloride flush, traMADol  Xrays DG Chest Port 1 View  Result Date: 04/09/2021 CLINICAL DATA:  70 year old female postoperative day 3 status post CABG. EXAM: PORTABLE CHEST 1 VIEW COMPARISON:  04/08/2021 and earlier. FINDINGS: Portable AP semi upright view at 0533 hours. Left chest tube and right IJ introducer sheath removed. Epicardial pacer wires remain. No pneumothorax. Stable cardiac size and mediastinal contours. No pulmonary edema. Improved left lung base ventilation but not completely resolved patchy opacity. Right lung appears negative. Stable visualized osseous structures. Visible bowel gas pattern stable and within normal limits. IMPRESSION: 1. Left chest tube and right IJ introducer sheath removed. No  pneumothorax. 2. Regressed but not resolved opacity at the left lung base, probable small pleural effusion. Electronically Signed   By: Odessa Fleming M.D.   On: 04/09/2021 08:16    Assessment/Plan: S/P Procedure(s) (LRB): CORONARY ARTERY BYPASS GRAFTING (CABG) X 3,ON PUMP, USING LEFT INTERNAL MAMMARY ARTERY AND RIGHT ENDOSCOPIC GREATER SAPHENOUS VEIN CONDUITS (N/A) TRANSESOPHAGEAL ECHOCARDIOGRAM (TEE) (N/A) ENDOVEIN HARVEST OF GREATER SAPHENOUS VEIN (Right) APPLICATION OF CELL SAVER POD#5  1 afeb, VSS, sinus rhythm , intermit afib yesterday- now on oral amio 2 sats good on RA 3 good UOP, weight about 3 KG over preop, cont to diurese for now 4 hyponatremia, mild ,cont to monitor- normal renal fxn  5 WBC conts to trend lower 6 BS controlled 7 TSH is elevated, on amio, will re-order her synthroid 8 H/H stable 9 cont rehab/pulm toilet, will d/c wires, poss home 1-2 days   LOS: 12 days    Rowe Clack PA-C Pager [phone] 04/10/2021   Patient seen and examined, agree with above Went back into a fib with RVR- continue beta blocker and amiodarone May need anticoagulation if a fib persists  Viviann Spare C. Dorris Fetch, MD [facility] [phone]

## 2021-04-10 NOTE — Progress Notes (Signed)
CARDIAC REHAB PHASE I   PRE:  Rate/Rhythm: 87 SR  BP:  Sitting: 107/65     SaO2: 96 RA  MODE:  Ambulation: 300 ft   POST:  Rate/Rhythm: 95 SR  BP:  Sitting: 125/71    SaO2: 97 RA   Pt ambulated 314ft in hallway assist of one with front wheel walker. Pt states nausea is some better, still doesn't feel great. Pt returned to bed. Pt requesting walker and 3-in-1 for home use. Hopefully home early next week. Encouraged continued ambulation and IS use. Will continue to follow.  [phone] Reynold Bowen, RN BSN 04/10/2021 1:03 PM

## 2021-04-10 NOTE — Progress Notes (Signed)
CARDIAC REHAB PHASE I   Offered to walk with pt. Pt agreeable. Finished getting a bath with NT. C/o some nausea. RN gave pt meds, got stuck on potassium pill and went into Afib. Rates 100-140s. Pt assisted back to bed. Will f/u and ambulate as appropriate.  [phone] Reynold Bowen, RN BSN 04/10/2021 9:41 AM

## 2021-04-10 NOTE — Progress Notes (Signed)
Pt flipped back into AFIB RVR HR 140. PRN med given and PA notified. Going to wait one more day before pulling pacing wires   Everlean Cherry, RN

## 2021-04-11 MED ORDER — MAGNESIUM OXIDE -MG SUPPLEMENT 400 (240 MG) MG PO TABS
400.0000 mg | ORAL_TABLET | Freq: Two times a day (BID) | ORAL | Status: DC
  Administered 2021-04-11 – 2021-04-12 (×3): 400 mg via ORAL
  Filled 2021-04-11 (×3): qty 1

## 2021-04-11 NOTE — Progress Notes (Signed)
Mobility Specialist: Progress Note   04/11/21 1748  Mobility  Activity Ambulated with assistance in hallway  Level of Assistance Modified independent, requires aide device or extra time  Assistive Device Front wheel walker  Distance Ambulated (ft) 380 ft  Activity Response Tolerated well  $Mobility charge 1 Mobility   Pre-Mobility: 88 HR Post-Mobility: 100 HR  Pt states she feels much better today and has been getting up to the BR but no hallway ambulation yet. Agreeable to mobility, no c/o throughout. Pt back to bed after walk with call bell at her side and family present in the room.   [facility] Maydelin Deming Mobility Specialist Mobility Specialist 4 [place]: [phone] Mobility Specialist 2 [place] and 6 [place]: [phone]

## 2021-04-11 NOTE — Progress Notes (Signed)
Pacing wires removed. Pt tolerated well   Everlean Cherry, RN

## 2021-04-11 NOTE — Progress Notes (Addendum)
[address]       [place] [address]             [phone]      5 Days Post-Op Procedure(s) (LRB): CORONARY ARTERY BYPASS GRAFTING (CABG) X 3,ON PUMP, USING LEFT INTERNAL MAMMARY ARTERY AND RIGHT ENDOSCOPIC GREATER SAPHENOUS VEIN CONDUITS (N/A) TRANSESOPHAGEAL ECHOCARDIOGRAM (TEE) (N/A) ENDOVEIN HARVEST OF GREATER SAPHENOUS VEIN (Right) APPLICATION OF CELL SAVER Subjective: Feels pretty well   Objective: Vital signs in last 24 hours: Temp:  [97.9 F (36.6 C)-98.5 F (36.9 C)] 98 F (36.7 C) (01/22 0425) Pulse Rate:  [83-92] 89 (01/22 0425) Cardiac Rhythm: Normal sinus rhythm;Bundle branch block (01/21 1926) Resp:  [15-20] 18 (01/22 0425) BP: (107-137)/(52-82) 125/68 (01/22 0425) SpO2:  [93 %-96 %] 93 % (01/22 0425) Weight:  [75.2 kg] 75.2 kg (01/22 0425)  Hemodynamic parameters for last 24 hours:    Intake/Output from previous day: 01/21 0701 - 01/22 0700 In: 240 [P.O.:240] Out: 1100 [Urine:1100] Intake/Output this shift: No intake/output data recorded.  General appearance: alert, cooperative, and no distress Heart: regular rate and rhythm Lungs: dim mildly L>R base Abdomen: benign Extremities: no edema Wound: incis healing well  Lab Results: Recent Labs    04/09/21 0155 04/10/21 0055  WBC 20.9* 16.8*  HGB 10.5* 10.5*  HCT 32.2* 31.5*  PLT 164 213   BMET:  Recent Labs    04/09/21 0155 04/10/21 0055  NA 133* 136  K 4.0 3.8  CL 94* 95*  CO2 31 30  GLUCOSE 137* 132*  BUN 11 13  CREATININE 0.84 0.92  CALCIUM 9.1 9.1    PT/INR: No results for input(s): LABPROT, INR in the last 72 hours. ABG    Component Value Date/Time   PHART 7.301 (L) 04/06/2021 1906   HCO3 23.7 04/06/2021 1906   TCO2 25 04/06/2021 1906   ACIDBASEDEF 3.0 (H) 04/06/2021 1906   O2SAT 64.1 04/07/2021 0402   CBG (last 3)  Recent Labs    04/08/21 2132 04/09/21 0241 04/09/21 0635  GLUCAP 145* 135* 108*    Meds Scheduled Meds:  acetaminophen   1,000 mg Oral Q6H   Or   acetaminophen (TYLENOL) oral liquid 160 mg/5 mL  1,000 mg Per Tube Q6H   amiodarone  400 mg Oral BID   aspirin EC  325 mg Oral Daily   Or   aspirin  324 mg Per Tube Daily   atorvastatin  80 mg Oral Daily   bisacodyl  10 mg Oral Daily   Or   bisacodyl  10 mg Rectal Daily   carbamide peroxide  5 drop Right EAR BID   Chlorhexidine Gluconate Cloth  6 each Topical Daily   docusate sodium  200 mg Oral Daily   DULoxetine  30 mg Oral Daily   furosemide  20 mg Intravenous BID   levothyroxine  37.5 mcg Oral Q0600   mouth rinse  15 mL Mouth Rinse BID   metoprolol tartrate  12.5 mg Oral BID   Or   metoprolol tartrate  12.5 mg Per Tube BID   pantoprazole  40 mg Oral Daily   potassium chloride  20 mEq Oral Daily   sodium chloride flush  3 mL Intravenous Q12H   sodium chloride flush  3 mL Intravenous Q12H   Continuous Infusions:  sodium chloride Stopped (04/07/21 1047)   sodium chloride 250 mL (04/07/21 0604)   sodium chloride Stopped (04/08/21 0828)   sodium chloride     lactated  ringers     PRN Meds:.sodium chloride, sodium chloride, clonazePAM, influenza vaccine adjuvanted, lactated ringers, metoprolol tartrate, ondansetron (ZOFRAN) IV, oxyCODONE, sodium chloride flush, sodium chloride flush, traMADol  Xrays No results found.  Assessment/Plan: S/P Procedure(s) (LRB): CORONARY ARTERY BYPASS GRAFTING (CABG) X 3,ON PUMP, USING LEFT INTERNAL MAMMARY ARTERY AND RIGHT ENDOSCOPIC GREATER SAPHENOUS VEIN CONDUITS (N/A) TRANSESOPHAGEAL ECHOCARDIOGRAM (TEE) (N/A) ENDOVEIN HARVEST OF GREATER SAPHENOUS VEIN (Right) APPLICATION OF CELL SAVER POD#6  1 afeb, VSS, sinus rhythm since around 13:00 yesterday, if recurs will prob need to start ACRX, cont amio, lopressor, back on synthroid, replace MG++ 2 sats ok on RA 3 voiding well- unmeasured, weight trending down, close to preop- cont lasix one more day 4 no new labs, recheck in am 5 pull epw's today, no brady  dysrhythmias 6 cont pulm hygiene/rehab modalities 7 hopefully home in am    LOS: 13 days    Rowe Clack PA-C Pager [phone] 04/11/2021   Patient seen and examined, agree with above Pacing wires out Possibly home tomorrow if no further A fib  Jaycob Mcclenton C. Dorris Fetch, MD [facility] [phone]

## 2021-04-11 NOTE — Progress Notes (Signed)
Pt ambulated with front wheel walker x 470 feet pt tolerated well

## 2021-04-12 ENCOUNTER — Inpatient Hospital Stay (HOSPITAL_COMMUNITY): Payer: Medicare Other

## 2021-04-12 LAB — CBC
HCT: 33 % — ABNORMAL LOW (ref 36.0–46.0)
Hemoglobin: 11 g/dL — ABNORMAL LOW (ref 12.0–15.0)
MCH: 30.1 pg (ref 26.0–34.0)
MCHC: 33.3 g/dL (ref 30.0–36.0)
MCV: 90.4 fL (ref 80.0–100.0)
Platelets: 350 10*3/uL (ref 150–400)
RBC: 3.65 MIL/uL — ABNORMAL LOW (ref 3.87–5.11)
RDW: 15.3 % (ref 11.5–15.5)
WBC: 13.3 10*3/uL — ABNORMAL HIGH (ref 4.0–10.5)
nRBC: 0.2 % (ref 0.0–0.2)

## 2021-04-12 LAB — BASIC METABOLIC PANEL
Anion gap: 9 (ref 5–15)
BUN: 12 mg/dL (ref 8–23)
CO2: 32 mmol/L (ref 22–32)
Calcium: 9.3 mg/dL (ref 8.9–10.3)
Chloride: 99 mmol/L (ref 98–111)
Creatinine, Ser: 0.8 mg/dL (ref 0.44–1.00)
GFR, Estimated: >60 mL/min (ref >60)
Glucose, Bld: 119 mg/dL — ABNORMAL HIGH (ref 70–99)
Potassium: 4 mmol/L (ref 3.5–5.1)
Sodium: 140 mmol/L (ref 135–145)

## 2021-04-12 MED ORDER — AMIODARONE HCL 200 MG PO TABS: 400.0000 mg | ORAL_TABLET | Freq: Two times a day (BID) | ORAL | 1 refills | Status: DC

## 2021-04-12 MED ORDER — ATORVASTATIN CALCIUM 80 MG PO TABS: 80.0000 mg | ORAL_TABLET | Freq: Every day | ORAL | 1 refills | Status: DC

## 2021-04-12 MED ORDER — ASPIRIN 81 MG PO TBEC: 81.0000 mg | DELAYED_RELEASE_TABLET | Freq: Every day | ORAL | Status: DC

## 2021-04-12 MED ORDER — METOPROLOL TARTRATE 25 MG PO TABS: 12.5000 mg | ORAL_TABLET | Freq: Two times a day (BID) | ORAL | 3 refills | Status: DC

## 2021-04-12 MED ORDER — ASPIRIN 325 MG PO TBEC: 325.0000 mg | DELAYED_RELEASE_TABLET | Freq: Every day | ORAL | Status: DC

## 2021-04-12 MED ORDER — TRAMADOL HCL 50 MG PO TABS: 50.0000 mg | ORAL_TABLET | Freq: Four times a day (QID) | ORAL | 0 refills | Status: DC | PRN

## 2021-04-12 MED ORDER — METOPROLOL TARTRATE 25 MG/10 ML ORAL SUSPENSION
25.0000 mg | Freq: Two times a day (BID) | ORAL | Status: DC
  Filled 2021-04-12: qty 10

## 2021-04-12 MED ORDER — CLOPIDOGREL BISULFATE 75 MG PO TABS: 75.0000 mg | ORAL_TABLET | Freq: Every day | ORAL | 11 refills | Status: DC

## 2021-04-12 MED ORDER — CALCIUM CARBONATE ANTACID 500 MG PO CHEW: CHEWABLE_TABLET | ORAL | Status: DC

## 2021-04-12 MED ORDER — METOPROLOL TARTRATE 12.5 MG HALF TABLET
12.5000 mg | ORAL_TABLET | Freq: Two times a day (BID) | ORAL | Status: DC
  Administered 2021-04-12: 12.5 mg via ORAL
  Filled 2021-04-12: qty 1

## 2021-04-12 NOTE — Care Management Important Message (Signed)
Important Message  Patient Details  Name: Meredith Guerra MRN: [id number] Date of Birth: 12-08-1950   Medicare Important Message Given:  Yes     Shelda Altes 04/12/2021, 10:11 AM

## 2021-04-12 NOTE — Progress Notes (Signed)
Mobility Specialist: Progress Note   04/12/21 1012  Mobility  Activity Refused mobility   Pt refused mobility stating she would like to rest this morning before discharge.   [facility] Bharat Antillon Mobility Specialist Mobility Specialist 4 [place]: [phone] Mobility Specialist 2 [place] and 6 [place]: [phone]

## 2021-04-12 NOTE — Progress Notes (Addendum)
Explained discharge summary to patient. Educated on how to identify signs of infection as well as limitations due to medical interventions.   Patient verbalized having an understanding of nursing education.   All belongings transported down with patient for discharge to include equipment delivered to her room.  Notified telemetry monitoring that patient was removed from the monitor and discharging home.

## 2021-04-12 NOTE — Progress Notes (Signed)
Sutures removed and steri strips applied.   Kalman Jewels, RN 04/12/2021 9:26 AM

## 2021-04-12 NOTE — Progress Notes (Signed)
CARDIAC REHAB PHASE I   Discussed with pt IS, sternal precautions, diet, exercise, and CRPII. Pt receptive. Referred to [facility]. Set up d/c video. She would like RW and 3n1 for d/c for security. Progressing well. [id number]  [place], ACSM 04/12/2021 9:54 AM

## 2021-04-12 NOTE — TOC Transition Note (Signed)
Transition of Care (TOC) - CM/SW Discharge Note Marvetta Gibbons RN, BSN Transitions of Care Unit 4E- RN Case Manager See Treatment Team for direct phone #    Patient Details  Name: Meredith Guerra MRN: [id number] Date of Birth: 12/25/1950  Transition of Care [facility]) CM/SW Contact:  Dawayne Patricia, RN Phone Number: 04/12/2021, 2:02 PM   Clinical Narrative:    Pt stable for transition home today, orders placed for DME needs- RW and 3n1- call made to [employer] for DME- RW and 3n1 to be delivered to room prior to discharge.  HH referral made pre-op for TCTS CABG protocol with [facility]- they will f/u with pt for any HH needs post discharge.    Final next level of care: [place] Barriers to Discharge: No Barriers Identified   Patient Goals and CMS Choice    Pre-op referral    Discharge Placement                 Home w/ [facility]      Discharge Plan and Services                DME Arranged: 3-N-1, Walker rolling DME Agency: [employer] Date DME Agency Contacted: 04/12/21 Time DME Agency Contacted: 1000 Representative spoke with at DME Agency: Freda Munro HH Arranged:  (TCTS preop protocol) [place]: [place] Date [place]: 04/12/21   Representative spoke with at [place]: [place] ([place]) Interventions     Readmission Risk Interventions No flowsheet data found.

## 2021-04-12 NOTE — Plan of Care (Signed)

## 2021-04-12 NOTE — Progress Notes (Addendum)
[address]       [place] [address]             [phone]      6 Days Post-Op Procedure(s) (LRB): CORONARY ARTERY BYPASS GRAFTING (CABG) X 3,ON PUMP, USING LEFT INTERNAL MAMMARY ARTERY AND RIGHT ENDOSCOPIC GREATER SAPHENOUS VEIN CONDUITS (N/A) TRANSESOPHAGEAL ECHOCARDIOGRAM (TEE) (N/A) ENDOVEIN HARVEST OF GREATER SAPHENOUS VEIN (Right) APPLICATION OF CELL SAVER Subjective: Feeling stronger but still having some shortness of breath with activity.  Remains in SR.  Objective: Vital signs in last 24 hours: Temp:  [98 F (36.7 C)-99 F (37.2 C)] 99 F (37.2 C) (01/23 0735) Pulse Rate:  [89-99] 91 (01/23 0735) Cardiac Rhythm: Normal sinus rhythm;Bundle branch block (01/22 1942) Resp:  [16-20] 17 (01/23 0735) BP: (113-154)/(66-82) 124/66 (01/23 0735) SpO2:  [90 %-95 %] 93 % (01/23 0735) Weight:  [75 kg] 75 kg (01/23 0346)    Intake/Output from previous day: 01/22 0701 - 01/23 0700 In: 240 [P.O.:240] Out: 950 [Urine:950] Intake/Output this shift: No intake/output data recorded.  General appearance: alert, cooperative, and no distress Heart: regular rate and rhythm, no further atrial fib. HR 80's-90. Lungs: breath sounds are full, clear and equal.  Abdomen: soft, NT. Extremities: no edema Wound: the sternotomy and RLE EVH incisions are intact and dry.   Lab Results: Recent Labs    04/10/21 0055 04/12/21 0227  WBC 16.8* 13.3*  HGB 10.5* 11.0*  HCT 31.5* 33.0*  PLT 213 350    BMET:  Recent Labs    04/10/21 0055 04/12/21 0227  NA 136 140  K 3.8 4.0  CL 95* 99  CO2 30 32  GLUCOSE 132* 119*  BUN 13 12  CREATININE 0.92 0.80  CALCIUM 9.1 9.3     PT/INR:  No results for input(s): LABPROT, INR in the last 72 hours.  ABG    Component Value Date/Time   PHART 7.301 (L) 04/06/2021 1906   HCO3 23.7 04/06/2021 1906   TCO2 25 04/06/2021 1906   ACIDBASEDEF 3.0 (H) 04/06/2021 1906   O2SAT 64.1 04/07/2021 0402   CBG (last 3)  No  results for input(s): GLUCAP in the last 72 hours.   Meds Scheduled Meds:  amiodarone  400 mg Oral BID   aspirin EC  325 mg Oral Daily   Or   aspirin  324 mg Per Tube Daily   atorvastatin  80 mg Oral Daily   bisacodyl  10 mg Oral Daily   Or   bisacodyl  10 mg Rectal Daily   carbamide peroxide  5 drop Right EAR BID   docusate sodium  200 mg Oral Daily   DULoxetine  30 mg Oral Daily   levothyroxine  37.5 mcg Oral Q0600   magnesium oxide  400 mg Oral BID   mouth rinse  15 mL Mouth Rinse BID   metoprolol tartrate  12.5 mg Oral BID   Or   metoprolol tartrate  25 mg Per Tube BID   pantoprazole  40 mg Oral Daily   potassium chloride  20 mEq Oral Daily   sodium chloride flush  3 mL Intravenous Q12H   sodium chloride flush  3 mL Intravenous Q12H   Continuous Infusions:  sodium chloride Stopped (04/07/21 1047)   sodium chloride 250 mL (04/07/21 0604)   sodium chloride Stopped (04/08/21 0828)   sodium chloride     lactated ringers     PRN Meds:.sodium chloride, sodium chloride, clonazePAM, influenza vaccine adjuvanted, lactated  ringers, metoprolol tartrate, ondansetron (ZOFRAN) IV, oxyCODONE, sodium chloride flush, sodium chloride flush, traMADol  Xrays No results found.  Assessment/Plan: S/P Procedure(s) (LRB): CORONARY ARTERY BYPASS GRAFTING (CABG) X 3,ON PUMP, USING LEFT INTERNAL MAMMARY ARTERY AND RIGHT ENDOSCOPIC GREATER SAPHENOUS VEIN CONDUITS (N/A) TRANSESOPHAGEAL ECHOCARDIOGRAM (TEE) (N/A) ENDOVEIN HARVEST OF GREATER SAPHENOUS VEIN (Right) APPLICATION OF CELL SAVER  -POD-6 CABG x 3 for left main coronary stenosis presenting with STEMI, EF 35%  post recent COVID infection. Doing well with mobility.  BP and HR trending up, will increase the metoprolol to 25mg  BID.   -Post-op atrial fibrillation- now in stable SR on oral amio.   -PULM- Oxygenating well but having some shortness of breath with activity. Likely related to recent COVID infection. Last CXR was 1/20, will  repeat this AM.   -HEME- mild expected acute blood loss anemia. Hct is trending up.   -ENDO-no h/o DM. Levothyroxine resumed.   -RENAL-normal renal function. Now below pre-op Wt. Stop Lasix and KCl.    -Disposition-Check CXR this AM and if OK will plan to discharge to home later this morning.     LOS: 14 days    2/20 PA-C Pager [place] 04/12/2021   CXR this am reviewed- ok for DC home DC instructions reviewed with patient  patient examined and medical record reviewed,agree with above note. 04/14/2021 04/12/2021

## 2021-04-14 DIAGNOSIS — Z48812 Encounter for surgical aftercare following surgery on the circulatory system: Secondary | ICD-10-CM | POA: Diagnosis not present

## 2021-04-23 ENCOUNTER — Inpatient Hospital Stay: Payer: Medicare Other

## 2021-04-23 ENCOUNTER — Other Ambulatory Visit: Payer: Self-pay

## 2021-04-23 ENCOUNTER — Ambulatory Visit: Payer: Medicare Other | Admitting: Cardiology

## 2021-04-23 ENCOUNTER — Encounter: Payer: Self-pay | Admitting: Cardiology

## 2021-04-23 VITALS — BP 132/85 | HR 69 | Temp 97.6°F | Resp 17 | Ht 63.0 in | Wt 165.6 lb

## 2021-04-23 DIAGNOSIS — I10 Essential (primary) hypertension: Secondary | ICD-10-CM

## 2021-04-23 DIAGNOSIS — I480 Paroxysmal atrial fibrillation: Secondary | ICD-10-CM

## 2021-04-23 DIAGNOSIS — I2510 Atherosclerotic heart disease of native coronary artery without angina pectoris: Secondary | ICD-10-CM

## 2021-04-23 DIAGNOSIS — E782 Mixed hyperlipidemia: Secondary | ICD-10-CM

## 2021-04-23 MED ORDER — AMLODIPINE BESYLATE 5 MG PO TABS: 5.0000 mg | ORAL_TABLET | Freq: Every day | ORAL | 3 refills | Status: DC

## 2021-04-23 MED ORDER — ATORVASTATIN CALCIUM 80 MG PO TABS: 80.0000 mg | ORAL_TABLET | Freq: Every day | ORAL | 3 refills | Status: DC

## 2021-04-23 MED ORDER — AMIODARONE HCL 200 MG PO TABS: 200.0000 mg | ORAL_TABLET | Freq: Every day | ORAL | 1 refills | Status: DC

## 2021-04-23 MED ORDER — EMPAGLIFLOZIN 10 MG PO TABS: 10.0000 mg | ORAL_TABLET | Freq: Every day | ORAL | 3 refills | Status: DC

## 2021-04-23 MED ORDER — ASPIRIN 81 MG PO TBEC: 81.0000 mg | DELAYED_RELEASE_TABLET | Freq: Every day | ORAL | 3 refills | Status: DC

## 2021-04-23 MED ORDER — CLOPIDOGREL BISULFATE 75 MG PO TABS: 75.0000 mg | ORAL_TABLET | Freq: Every day | ORAL | 3 refills | Status: DC

## 2021-04-23 NOTE — Progress Notes (Signed)
Follow up visit  Subjective:   Meredith Guerra, female    DOB: [birth year], 71 y.o.   MRN: [id number]    HPI  Chief Complaint  Patient presents with   New Patient (Initial Visit)   CABG    71 y.o. Caucasian female  with hypertension, mixed hyperlipidemia, asthma, remote history of Takotsubo cardiomyopathy, s/p CABGX3 (LIMA-LAD, SVG-RI-SVG-OM) for STEMI presentation while recovering from [place] (03/2021), brief postop A. fib, now resolved.  Patient is here with her husband today.  She is doing well since her surgery with no recurrent chest pain, other than chest wall pain from sternotomy recovery.  She does report shortness of breath on walking, but is improving.  She denies any orthopnea, PND, leg, symptoms.  Patient is currently on amiodarone 200 mg daily, on tapering dose since her hospitalization.  She developed postop A. fib that lasted <48 hours.  Patient was discharged on aspirin and Plavix, but is not on any anticoagulation given the brief duration of her A. fib.   Current Outpatient Medications on File Prior to Visit  Medication Sig Dispense Refill   acetaminophen (TYLENOL) 500 MG tablet Take 1,000 mg by mouth every 6 (six) hours as needed for mild pain, headache or fever.     albuterol (PROVENTIL) 2 MG tablet Take 2 mg by mouth 3 (three) times daily as needed for wheezing or shortness of breath.     albuterol (VENTOLIN HFA) 108 (90 Base) MCG/ACT inhaler Inhale 2 puffs into the lungs in the morning and at bedtime.     amiodarone (PACERONE) 200 MG tablet Take 2 tablets (400 mg total) by mouth 2 (two) times daily. X 3 days, then decrease to 200 mg BID x 7 days, then decrease to 200 mg daily 60 tablet 1   aspirin 81 MG EC tablet Take 1 tablet (81 mg total) by mouth daily.     atorvastatin (LIPITOR) 80 MG tablet Take 1 tablet (80 mg total) by mouth daily. 30 tablet 1   budesonide-formoterol (SYMBICORT) 160-4.5 MCG/ACT inhaler Inhale 2 puffs into the lungs 2 (two) times daily.      butalbital-acetaminophen-caffeine (FIORICET) 50-325-40 MG tablet Take 1 tablet by mouth 2 (two) times daily as needed for migraine.     calcium carbonate (TUMS - DOSED IN MG ELEMENTAL CALCIUM) 500 MG chewable tablet Take 1 tablet by mouth daily as needed for heartburn, indigestion     Cholecalciferol (VITAMIN D-3) 25 MCG (1000 UT) CAPS Take 1,000 Units by mouth daily.     clonazePAM (KLONOPIN) 0.5 MG tablet Take 0.5 mg by mouth daily as needed for anxiety.     clopidogrel (PLAVIX) 75 MG tablet Take 1 tablet (75 mg total) by mouth daily. 30 tablet 11   DULoxetine (CYMBALTA) 30 MG capsule Take 30 mg by mouth daily. Takes in addition to 7m dose for a total daily dose of 933m    DULoxetine (CYMBALTA) 60 MG capsule Take 60 mg by mouth at bedtime. Takes in addition to 3051mose for a total daily dose of 36m38m  levothyroxine (SYNTHROID) 25 MCG tablet Take 25 mcg by mouth See admin instructions. Take 1 and 1/2 every morning     loratadine (CLARITIN) 10 MG tablet Take 10 mg by mouth daily as needed.     metoprolol tartrate (LOPRESSOR) 25 MG tablet Take 0.5 tablets (12.5 mg total) by mouth 2 (two) times daily. 30 tablet 3   montelukast (SINGULAIR) 10 MG tablet Take 10 mg by  mouth at bedtime.     ondansetron (ZOFRAN) 4 MG tablet Take 4 mg by mouth every 8 (eight) hours as needed for nausea or vomiting.     pantoprazole (PROTONIX) 40 MG tablet Take 80 mg by mouth daily.     promethazine (PHENERGAN) 12.5 MG tablet Take 12.5 mg by mouth every 4 (four) hours as needed for nausea or vomiting.     Rimegepant Sulfate (NURTEC PO) Take by mouth as needed.     tiZANidine (ZANAFLEX) 4 MG tablet Take 2-8 mg by mouth See admin instructions. Take 2 tablets every evening if needed take 1/2 to a whole if headache is bad     traMADol (ULTRAM) 50 MG tablet Take 1 tablet (50 mg total) by mouth every 6 (six) hours as needed for moderate pain. 30 tablet 0   traZODone (DESYREL) 150 MG tablet Take 150 mg by mouth at bedtime.      No current facility-administered medications on file prior to visit.    Cardiovascular & other pertient studies:  EKG 04/23/2021: Sinus rhythm 62 bpm Inferior and anterolateral T eave inversion, consider ischemia  Op note 04/06/2021 (Dr Prescott Gum): 1.  Coronary artery bypass grafting x3 (left internal mammary artery to LAD, saphenous vein graft to ramus intermedius, saphenous vein graft to circumflex marginal).  2.  Endoscopic harvest of right leg greater saphenous vein.  Cardiac MRI 03/31/2021 1.  No late gadolinium enhancement, suggesting myocardium is viable 2. Normal LV size with mild systolic dysfunction (EF 72%). Apical hypokinesis 3.  Small RV size with normal systolic function (EF 82%)  Echocardiogram 03/30/2021: 1.Left ventricular ejection fraction, by estimation, is 30 to 35%. The  left ventricle has moderately decreased function. The left ventricle  demonstrates regional wall motion abnormalities (see scoring  diagram/findings for description). Left ventricular   diastolic parameters are consistent with Grade I diastolic dysfunction  (impaired relaxation). There is akinesis of the left ventricular,  mid-apical anteroseptal wall. There is severe hypokinesis of the left  ventricular, mid-apical inferolateral wall.   2. Right ventricular systolic function is low normal. The right  ventricular size is normal.   3. The mitral valve is normal in structure. No evidence of mitral valve  regurgitation. No evidence of mitral stenosis.   4. The aortic valve is tricuspid. Aortic valve regurgitation is not   Coronary angiogram 03/29/2021: LM: 90% distal stenosis at the trifurcation LAD: Proximal focal 80% stenosis at bifurcation with large septal perforator branch, focal mid 60% stenosis Ramus intermedius: No significant disease Left circumflex: Mid 30% stenosis RCA: Moderate diffuse disease in small caliber RPDA   Aspirin Aggrastat for next 4 hours, then IV heparin IV  nitroglycerin BP control CVTS consult for CABG  Recent labs: 04/12/2021: Glucose 119, BUN/Cr 12/0.8. EGFR >60. Na/K 140/4.4 H/H 11/33. MCV 90. Platelets 350 HbA1C 6.6% Chol 327, TG 221, HDL 51, LDL 221     Review of Systems  Cardiovascular:  Positive for chest pain (Chest wall pain from sternotomy recovery) and dyspnea on exertion (Mild, improving). Negative for leg swelling, palpitations and syncope.        Vitals:   04/23/21 0953  BP: 132/85  Pulse: 69  Resp: 17  Temp: 97.6 F (36.4 C)  SpO2: 95%    Body mass index is 29.33 kg/m. Filed Weights   04/23/21 0953  Weight: 165 lb 9.6 oz (75.1 kg)     Objective:   Physical Exam Vitals and nursing note reviewed.  Constitutional:  General: She is not in acute distress. Neck:     Vascular: No JVD.  Cardiovascular:     Rate and Rhythm: Normal rate and regular rhythm.     Pulses:          Dorsalis pedis pulses are 0 on the right side and 0 on the left side.       Posterior tibial pulses are 0 on the right side and 1+ on the left side.     Heart sounds: Normal heart sounds. No murmur heard. Pulmonary:     Effort: Pulmonary effort is normal.     Breath sounds: Normal breath sounds. No wheezing or rales.  Musculoskeletal:     Right lower leg: No edema.     Left lower leg: No edema.          Assessment & Recommendations:   71 y.o. Caucasian female  with hypertension, mixed hyperlipidemia, asthma, remote history of Takotsubo cardiomyopathy, s/p CABGX3 (LIMA-LAD, SVG-RI-SVG-OM) for STEMI presentation while recovering from [place] (03/2021), brief postop A. fib, now resolved.  Coronary artery disease: S/p CABGX3 (LIMA-LAD, SVG-RI-SVG-OM) (03/2021) No exertional chest pain.  Chest wall pain likely secondary from sternotomy.  Expect this to improve. Continue aspirin and Plavix for 1 year given ACS presentation prior to CABG. Continue Lipitor 80 mg daily.  Recheck lipid panel.  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  We will follow-up after lipid panel. Continue metoprolol tartrate 12.5 mg twice daily. Added amlodipine 2.5 mg daily to improve graft patency and losartan 12.5 mg given ACS presentation and mild DM, Also added Jardiance 10 mg daily.   Brief post op Afib: No recurrence. Will give 2 hour cardiac telemetry.   Mixed hyperlipidemia: As above    Nigel Mormon, MD Pager: [phone] Office: [phone]

## 2021-04-24 ENCOUNTER — Encounter: Payer: Self-pay | Admitting: Cardiology

## 2021-04-24 DIAGNOSIS — I480 Paroxysmal atrial fibrillation: Secondary | ICD-10-CM | POA: Insufficient documentation

## 2021-04-24 DIAGNOSIS — I2510 Atherosclerotic heart disease of native coronary artery without angina pectoris: Secondary | ICD-10-CM | POA: Insufficient documentation

## 2021-04-24 DIAGNOSIS — I10 Essential (primary) hypertension: Secondary | ICD-10-CM | POA: Insufficient documentation

## 2021-04-24 DIAGNOSIS — E782 Mixed hyperlipidemia: Secondary | ICD-10-CM | POA: Insufficient documentation

## 2021-04-24 MED ORDER — LOSARTAN POTASSIUM 25 MG PO TABS
12.5000 mg | ORAL_TABLET | Freq: Every day | ORAL | 3 refills | Status: DC
Start: 2021-04-24 — End: 2021-11-18

## 2021-04-24 MED ORDER — AMLODIPINE BESYLATE 2.5 MG PO TABS: 2.5000 mg | ORAL_TABLET | Freq: Every day | ORAL | 3 refills | Status: DC

## 2021-04-30 ENCOUNTER — Other Ambulatory Visit: Payer: Self-pay | Admitting: Cardiothoracic Surgery

## 2021-04-30 DIAGNOSIS — Z951 Presence of aortocoronary bypass graft: Secondary | ICD-10-CM

## 2021-05-01 LAB — LIPID PANEL
Chol/HDL Ratio: 2.7 ratio (ref 0.0–4.4)
Cholesterol, Total: 158 mg/dL (ref 100–199)
HDL: 58 mg/dL (ref >39)
LDL Chol Calc (NIH): 78 mg/dL (ref 0–99)
Triglycerides: 127 mg/dL (ref 0–149)
VLDL Cholesterol Cal: 22 mg/dL (ref 5–40)

## 2021-05-03 ENCOUNTER — Ambulatory Visit (INDEPENDENT_AMBULATORY_CARE_PROVIDER_SITE_OTHER): Payer: Self-pay | Admitting: Cardiothoracic Surgery

## 2021-05-03 ENCOUNTER — Other Ambulatory Visit: Payer: Self-pay

## 2021-05-03 ENCOUNTER — Ambulatory Visit
Admission: RE | Admit: 2021-05-03 | Discharge: 2021-05-03 | Disposition: A | Discharge status: Home / Self Care | Payer: Medicare Other | Source: Ambulatory Visit | Attending: Cardiothoracic Surgery | Admitting: Cardiothoracic Surgery

## 2021-05-03 ENCOUNTER — Other Ambulatory Visit: Payer: Self-pay | Admitting: Cardiology

## 2021-05-03 ENCOUNTER — Encounter: Payer: Self-pay | Admitting: Cardiothoracic Surgery

## 2021-05-03 VITALS — BP 134/81 | HR 67 | Resp 20 | Ht 63.0 in | Wt 163.0 lb

## 2021-05-03 DIAGNOSIS — I2510 Atherosclerotic heart disease of native coronary artery without angina pectoris: Secondary | ICD-10-CM

## 2021-05-03 DIAGNOSIS — Z951 Presence of aortocoronary bypass graft: Secondary | ICD-10-CM

## 2021-05-03 DIAGNOSIS — E782 Mixed hyperlipidemia: Secondary | ICD-10-CM

## 2021-05-03 IMAGING — CR DG CHEST 2V
2 series · 2 of 2 positions shown · non-contrast
Comparison: [DATE]

CLINICAL DATA: Sternal pain, CABG [DATE], shortness of breath
with exertion

EXAM:
CHEST - 2 VIEW

[w chest pa]
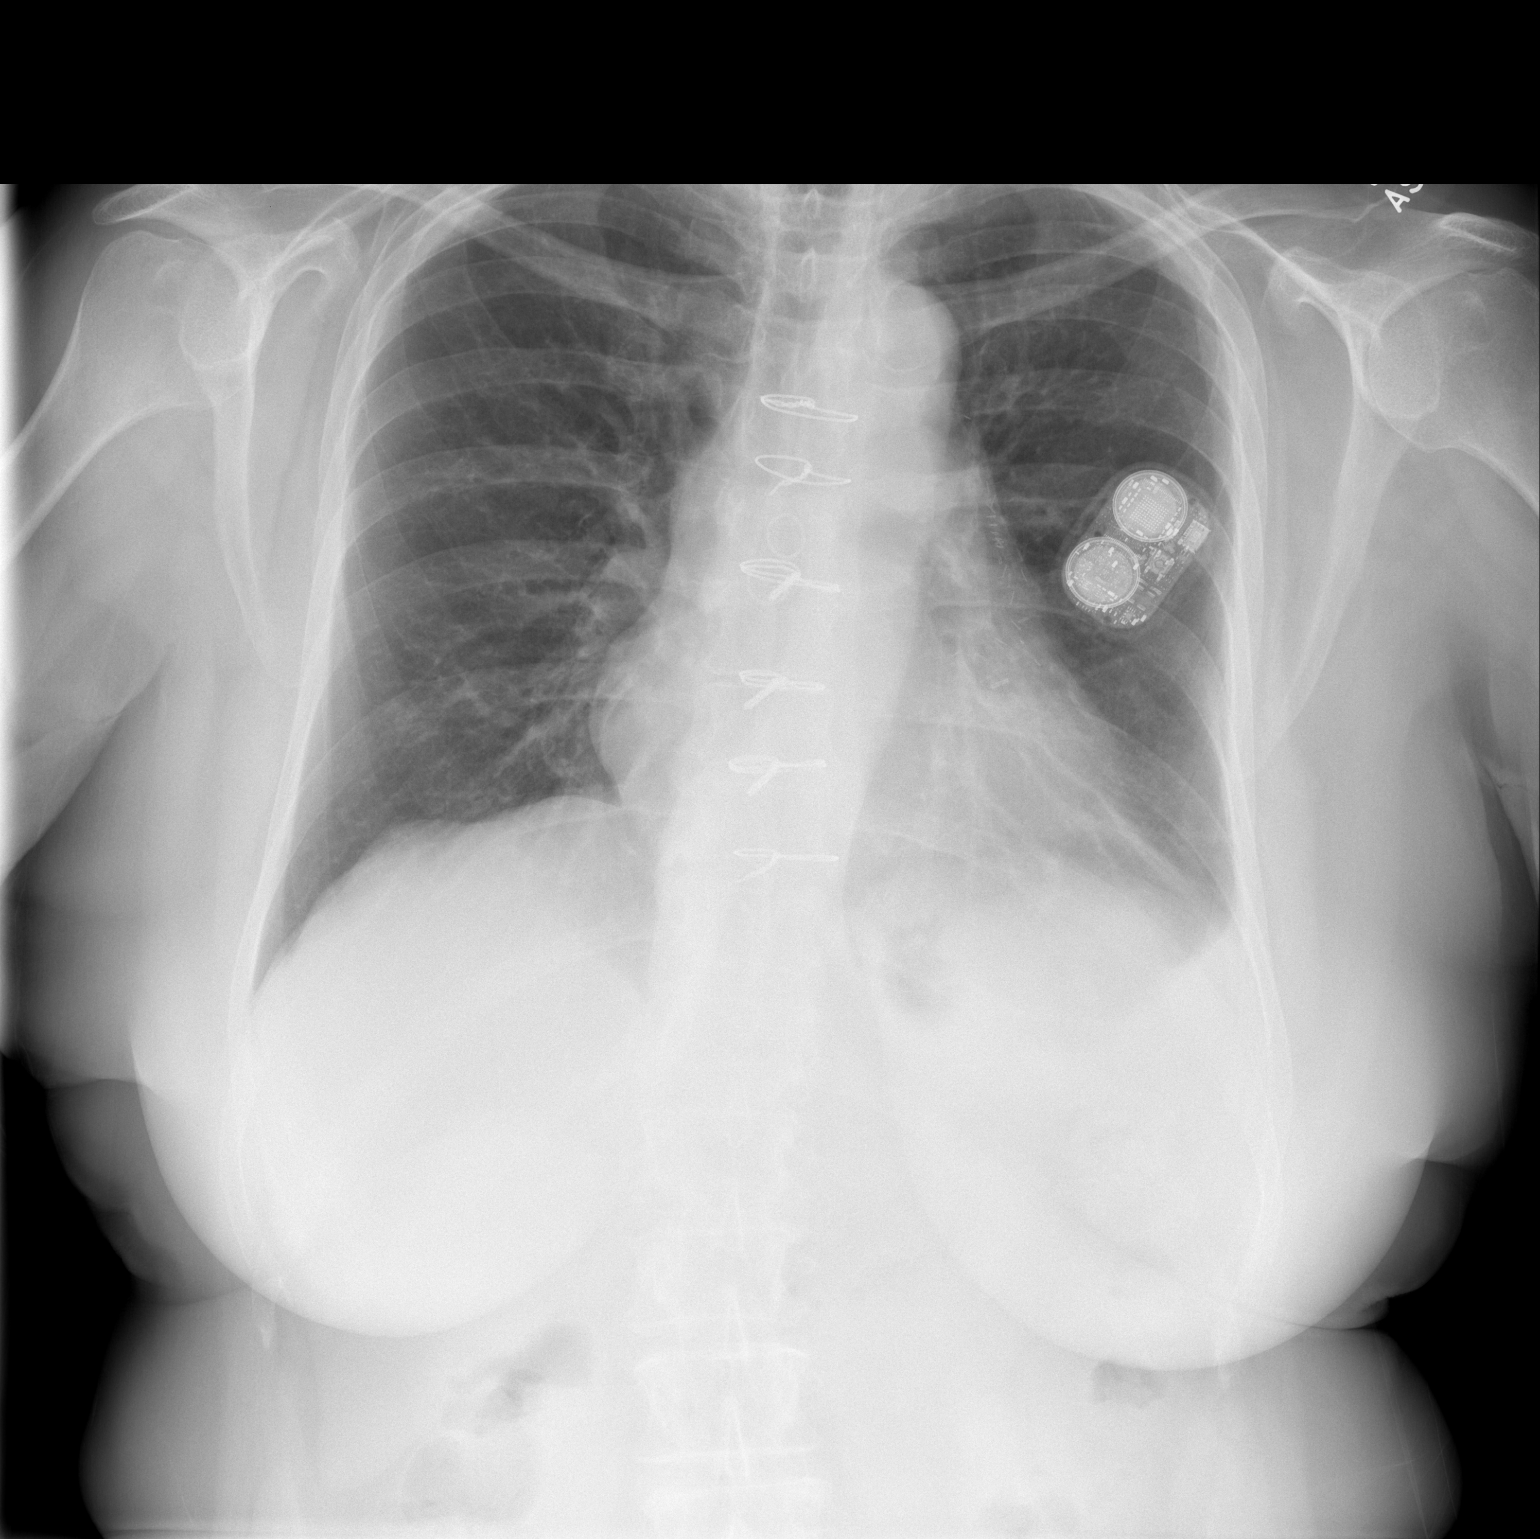

[w chest lat]
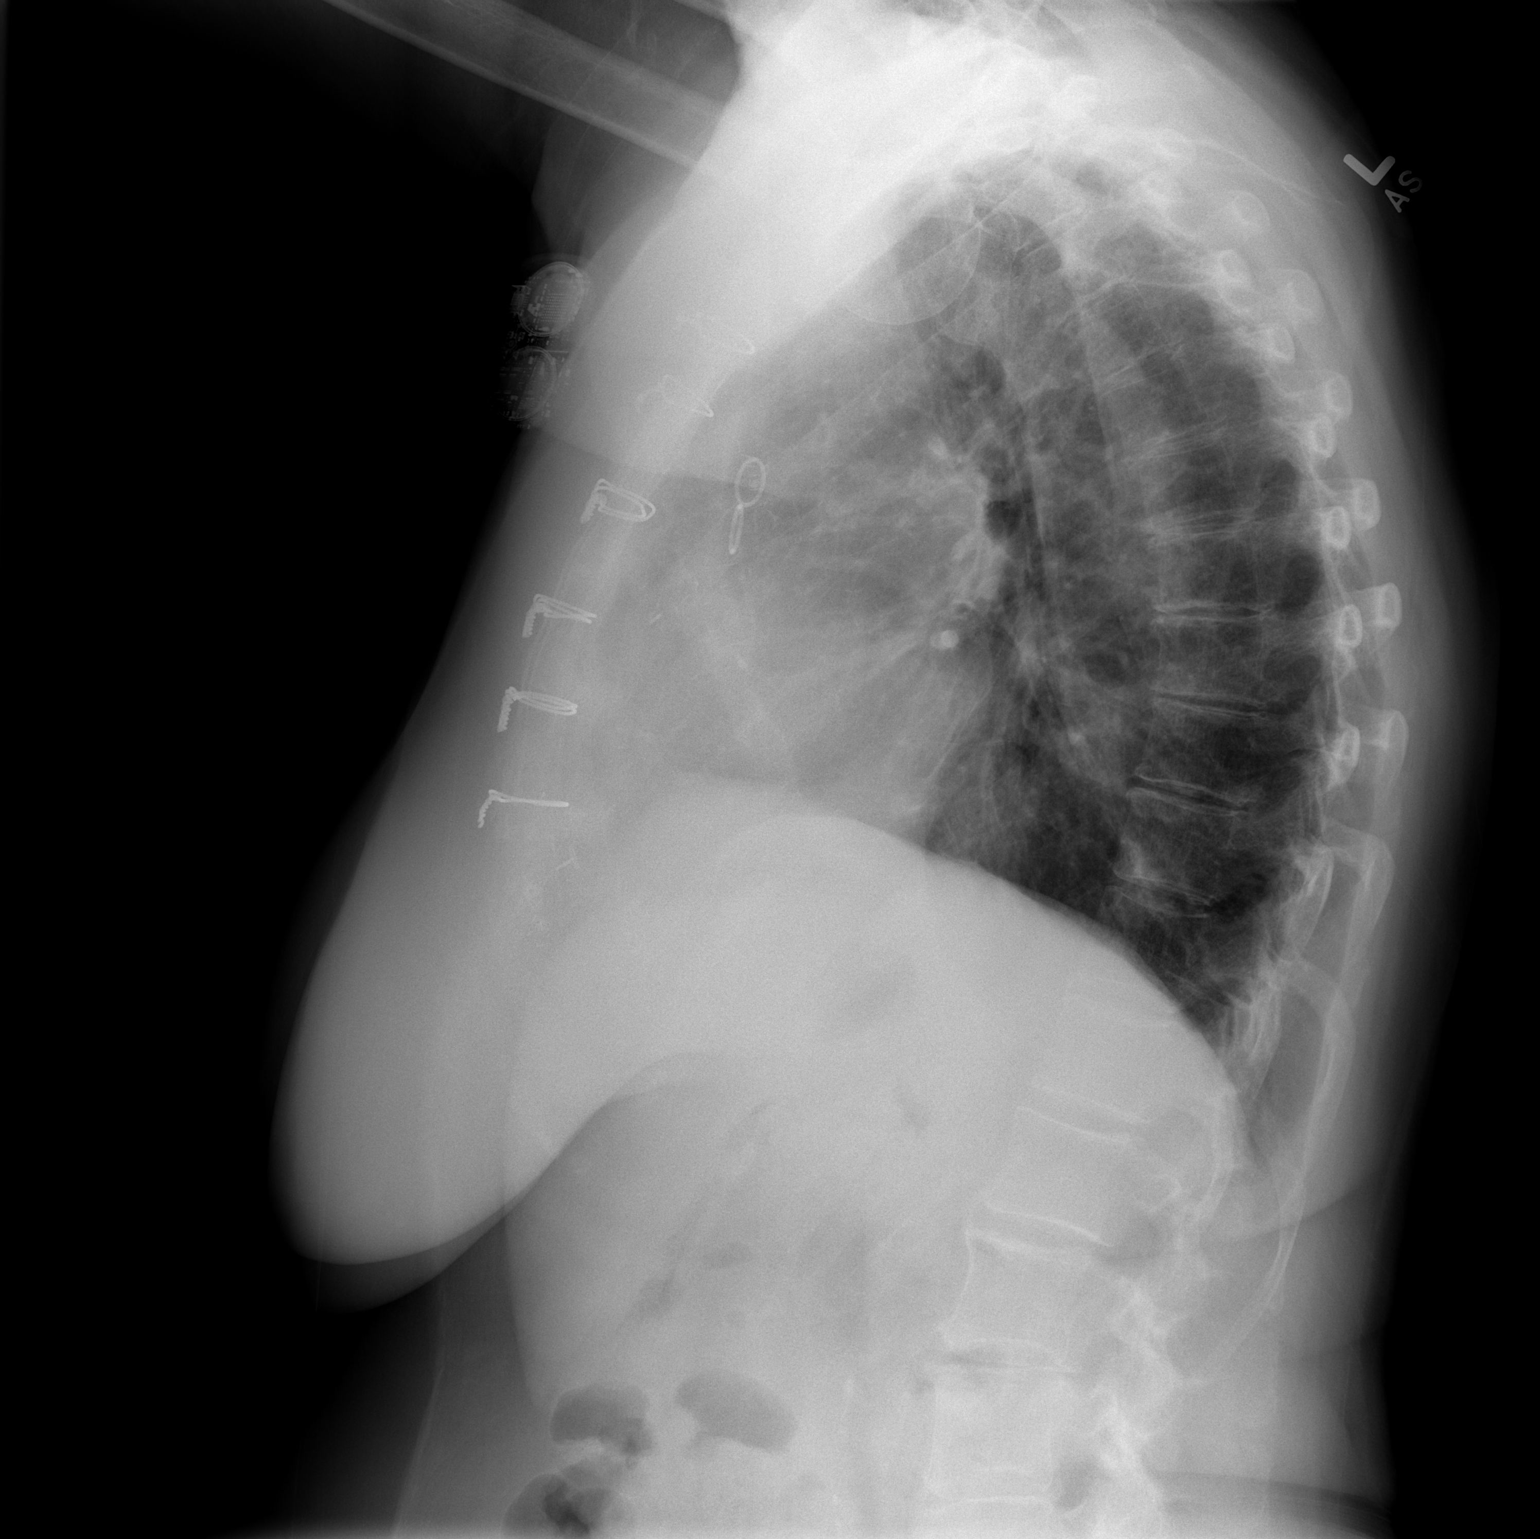

[2 of 2 positions shown; findings below may reference images not displayed]

FINDINGS: Status post median sternotomy and CABG. Cardiac and mediastinal
contours are within normal limits. Cardiac device overlies the left
midlung. Redemonstrated blunting of the left costophrenic angle,
likely a small left pleural effusion or pleural thickening, as this
opacity is not seen on the lateral view. No right pleural effusion.
Left basilar atelectasis. No pneumothorax. No acute osseous
abnormality.
IMPRESSION: Blunting of the left costophrenic angle, possibly a small left
pleural effusion or pleural thickening.

## 2021-05-03 NOTE — Progress Notes (Signed)
PCP is Redmond School, MD Referring Provider is Nigel Mormon, MD  Chief Complaint  Patient presents with   Routine Post Op    S/p CABG 1/17, cxr today    HPI: The patient returns for scheduled follow-up after multivessel CABG on January 9.  She is doing well and is starting to walk.  The surgical incisions are well-healed.  I reviewed her chest x-ray images today and it shows clear lung fields without significant pleural effusion and sternal wires intact.  Patient developed atrial fibrillation postoperatively and is now tapering off course of oral amiodarone which will stop approximately a month after discharge.  He is not on oral anticoagulation.     Past Medical History:  Diagnosis Date   Asthma    Colitis    Hypertension    Kidney stones    Migraines     Past Surgical History:  Procedure Laterality Date   ABDOMINAL HYSTERECTOMY     CORONARY ARTERY BYPASS GRAFT N/A 04/06/2021   Procedure: CORONARY ARTERY BYPASS GRAFTING (CABG) X 3,ON PUMP, USING LEFT INTERNAL MAMMARY ARTERY AND RIGHT ENDOSCOPIC GREATER SAPHENOUS VEIN CONDUITS;  Surgeon: Dahlia Byes, MD;  Location: [facility];  Service: Open Heart Surgery;  Laterality: N/A;   ENDOVEIN HARVEST OF GREATER SAPHENOUS VEIN Right 04/06/2021   Procedure: ENDOVEIN HARVEST OF GREATER SAPHENOUS VEIN;  Surgeon: Dahlia Byes, MD;  Location: [facility];  Service: Open Heart Surgery;  Laterality: Right;   LEFT HEART CATH AND CORONARY ANGIOGRAPHY N/A 03/29/2021   Procedure: LEFT HEART CATH AND CORONARY ANGIOGRAPHY;  Surgeon: Nigel Mormon, MD;  Location: [facility];  Service: Cardiovascular;  Laterality: N/A;   SHOULDER SURGERY     TEE WITHOUT CARDIOVERSION N/A 04/06/2021   Procedure: TRANSESOPHAGEAL ECHOCARDIOGRAM (TEE);  Surgeon: Dahlia Byes, MD;  Location: [facility];  Service: Open Heart Surgery;  Laterality: N/A;   TONSILLECTOMY      Family History  Problem Relation Age of Onset   Hypertension Mother    Heart disease  Mother    Cancer Father    COPD Father    COPD Sister     Social History Social History   Tobacco Use   Smoking status: Never   Smokeless tobacco: Never  Vaping Use   Vaping Use: Never used  Substance Use Topics   Alcohol use: Not Currently   Drug use: Not Currently    Current Outpatient Medications  Medication Sig Dispense Refill   acetaminophen (TYLENOL) 500 MG tablet Take 1,000 mg by mouth every 6 (six) hours as needed for mild pain, headache or fever.     albuterol (PROVENTIL) 2 MG tablet Take 2 mg by mouth 3 (three) times daily as needed for wheezing or shortness of breath.     albuterol (VENTOLIN HFA) 108 (90 Base) MCG/ACT inhaler Inhale 2 puffs into the lungs in the morning and at bedtime.     amiodarone (PACERONE) 200 MG tablet Take 1 tablet (200 mg total) by mouth daily. X 3 days, then decrease to 200 mg BID x 7 days, then decrease to 200 mg daily 30 tablet 1   amLODipine (NORVASC) 2.5 MG tablet Take 1 tablet (2.5 mg total) by mouth daily. 60 tablet 3   aspirin 81 MG EC tablet Take 1 tablet (81 mg total) by mouth daily. 90 tablet 3   atorvastatin (LIPITOR) 80 MG tablet Take 1 tablet (80 mg total) by mouth daily. 90 tablet 3   budesonide-formoterol (SYMBICORT) 160-4.5 MCG/ACT inhaler Inhale 2 puffs  into the lungs 2 (two) times daily.     butalbital-acetaminophen-caffeine (FIORICET) 50-325-40 MG tablet Take 1 tablet by mouth 2 (two) times daily as needed for migraine.     calcium carbonate (TUMS - DOSED IN MG ELEMENTAL CALCIUM) 500 MG chewable tablet Take 1 tablet by mouth daily as needed for heartburn, indigestion     Cholecalciferol (VITAMIN D-3) 25 MCG (1000 UT) CAPS Take 1,000 Units by mouth daily.     clonazePAM (KLONOPIN) 0.5 MG tablet Take 0.5 mg by mouth daily as needed for anxiety.     clopidogrel (PLAVIX) 75 MG tablet Take 1 tablet (75 mg total) by mouth daily. 90 tablet 3   DULoxetine (CYMBALTA) 30 MG capsule Take 30 mg by mouth daily. Takes in addition to 60mg   dose for a total daily dose of 90mg      DULoxetine (CYMBALTA) 60 MG capsule Take 60 mg by mouth at bedtime. Takes in addition to 30mg  dose for a total daily dose of 90mg      empagliflozin (JARDIANCE) 10 MG TABS tablet Take 1 tablet (10 mg total) by mouth daily before breakfast. 30 tablet 3   levothyroxine (SYNTHROID) 25 MCG tablet Take 25 mcg by mouth See admin instructions. Take 1 and 1/2 every morning     loratadine (CLARITIN) 10 MG tablet Take 10 mg by mouth daily as needed.     losartan (COZAAR) 25 MG tablet Take 0.5 tablets (12.5 mg total) by mouth daily. 60 tablet 3   metoprolol tartrate (LOPRESSOR) 25 MG tablet Take 0.5 tablets (12.5 mg total) by mouth 2 (two) times daily. 30 tablet 3   montelukast (SINGULAIR) 10 MG tablet Take 10 mg by mouth at bedtime.     ondansetron (ZOFRAN) 4 MG tablet Take 4 mg by mouth every 8 (eight) hours as needed for nausea or vomiting.     pantoprazole (PROTONIX) 40 MG tablet Take 80 mg by mouth daily.     promethazine (PHENERGAN) 12.5 MG tablet Take 12.5 mg by mouth every 4 (four) hours as needed for nausea or vomiting.     Rimegepant Sulfate (NURTEC PO) Take by mouth as needed.     tiZANidine (ZANAFLEX) 4 MG tablet Take 2-8 mg by mouth See admin instructions. Take 2 tablets every evening if needed take 1/2 to a whole if headache is bad     traMADol (ULTRAM) 50 MG tablet Take 1 tablet (50 mg total) by mouth every 6 (six) hours as needed for moderate pain. 30 tablet 0   traZODone (DESYREL) 150 MG tablet Take 150 mg by mouth at bedtime.     No current facility-administered medications for this visit.    Allergies  Allergen Reactions   Compazine [Prochlorperazine] Anxiety   Dilaudid [Hydromorphone]     Pt does not tolerate well   Haloperidol     homicidal thoughts   Reglan [Metoclopramide] Anxiety   Zolmitriptan Anxiety    Review of Systems  Complains of some left hand ulnar nerve distribution dysesthesia discussed with the patient.  She has some  intermittent chest wall discomfort from the incision.  BP 134/81 (BP Location: Left Arm, Patient Position: Sitting)    Pulse 67    Resp 20    Ht 5\' 3"  (1.6 m)    Wt 163 lb (73.9 kg)    SpO2 94% Comment: RA   BMI 28.87 kg/m  Physical Exam Patient appears alert and comfortable Lungs are clear Heart rhythm regular Stable well-healed Pedal edema  Diagnostic Tests: Chest t  x-ray performed today is reviewed and is satisfactory as noted above  Impression: Doing well 1 month after elective CABG for significant left main disease. She may now drive, lift up to 10 pounds, and use skin lotion on her incisions. Plan: Turn for final evaluation in 6-8 weeks. Stop amiodarone when current prescription is finished Dahlia Byes, MD [facility] [phone]

## 2021-05-04 ENCOUNTER — Other Ambulatory Visit: Payer: Self-pay | Admitting: Physician Assistant

## 2021-05-04 DIAGNOSIS — I480 Paroxysmal atrial fibrillation: Secondary | ICD-10-CM

## 2021-05-05 ENCOUNTER — Ambulatory Visit (INDEPENDENT_AMBULATORY_CARE_PROVIDER_SITE_OTHER): Payer: Medicare Other | Admitting: Gastroenterology

## 2021-05-09 ENCOUNTER — Other Ambulatory Visit: Payer: Self-pay | Admitting: Cardiology

## 2021-05-09 DIAGNOSIS — I480 Paroxysmal atrial fibrillation: Secondary | ICD-10-CM

## 2021-05-13 NOTE — Telephone Encounter (Signed)
Refill request

## 2021-05-14 NOTE — Telephone Encounter (Signed)
Initial sent accidentally as print prescription. I merely changed it to e-prescription. Not a true refill request. Please let the pharmacy know.  Thanks MJP

## 2021-05-24 ENCOUNTER — Other Ambulatory Visit: Payer: Self-pay

## 2021-05-24 ENCOUNTER — Encounter (HOSPITAL_COMMUNITY): Payer: Self-pay

## 2021-05-24 ENCOUNTER — Encounter (HOSPITAL_COMMUNITY)
Admission: RE | Admit: 2021-05-24 | Discharge: 2021-05-24 | Disposition: A | Discharge status: Home / Self Care | Payer: Medicare Other | Source: Ambulatory Visit | Attending: Cardiology | Admitting: Cardiology

## 2021-05-24 VITALS — BP 112/72 | HR 66 | Ht 63.0 in | Wt 165.3 lb

## 2021-05-24 DIAGNOSIS — I213 ST elevation (STEMI) myocardial infarction of unspecified site: Secondary | ICD-10-CM | POA: Diagnosis not present

## 2021-05-24 DIAGNOSIS — Z951 Presence of aortocoronary bypass graft: Secondary | ICD-10-CM | POA: Diagnosis present

## 2021-05-24 LAB — GLUCOSE, CAPILLARY: Glucose-Capillary: 92 mg/dL (ref 70–99)

## 2021-05-24 MED ORDER — AMIODARONE HCL 200 MG PO TABS: 200.0000 mg | ORAL_TABLET | Freq: Every day | ORAL | 1 refills | Status: DC

## 2021-05-24 NOTE — Addendum Note (Signed)
Addended by: Nigel Mormon on: 05/24/2021 09:46 PM ? ? Modules accepted: Orders ? ?

## 2021-05-24 NOTE — Progress Notes (Signed)
Cardiac Individual Treatment Plan  Patient Details  Name: Meredith Guerra MRN: [id number] Date of Birth: 03/31/1950 Referring Provider:   Flowsheet Row CARDIAC REHAB PHASE II ORIENTATION from 05/24/2021 in [facility]  Referring Provider Dr. Darcey Nora       Initial Encounter Date:  Flowsheet Row CARDIAC REHAB PHASE II ORIENTATION from 05/24/2021 in [facility]  Date 05/24/21       Visit Diagnosis: ST elevation myocardial infarction (STEMI), unspecified artery ([place])  S/P CABG x 3  Patient's Home Medications on Admission:  Current Outpatient Medications:    acetaminophen (TYLENOL) 500 MG tablet, Take 1,000 mg by mouth every 6 (six) hours as needed for mild pain, headache or fever., Disp: , Rfl:    albuterol (PROVENTIL) 2 MG tablet, Take 2 mg by mouth 3 (three) times daily as needed for wheezing or shortness of breath., Disp: , Rfl:    albuterol (VENTOLIN HFA) 108 (90 Base) MCG/ACT inhaler, Inhale 2 puffs into the lungs in the morning and at bedtime., Disp: , Rfl:    amLODipine (NORVASC) 2.5 MG tablet, Take 1 tablet (2.5 mg total) by mouth daily., Disp: 60 tablet, Rfl: 3   aspirin 81 MG EC tablet, Take 1 tablet (81 mg total) by mouth daily., Disp: 90 tablet, Rfl: 3   atorvastatin (LIPITOR) 80 MG tablet, Take 1 tablet (80 mg total) by mouth daily., Disp: 90 tablet, Rfl: 3   budesonide-formoterol (SYMBICORT) 160-4.5 MCG/ACT inhaler, Inhale 2 puffs into the lungs 2 (two) times daily., Disp: , Rfl:    butalbital-acetaminophen-caffeine (FIORICET) 50-325-40 MG tablet, Take 1 tablet by mouth 2 (two) times daily as needed for migraine., Disp: , Rfl:    Cholecalciferol (VITAMIN D-3) 25 MCG (1000 UT) CAPS, Take 1,000 Units by mouth daily., Disp: , Rfl:    clonazePAM (KLONOPIN) 0.5 MG tablet, Take 0.5 mg by mouth daily as needed for anxiety., Disp: , Rfl:    clopidogrel (PLAVIX) 75 MG tablet, Take 1 tablet (75 mg total) by mouth daily., Disp: 90 tablet, Rfl:  3   DULoxetine (CYMBALTA) 30 MG capsule, Take 30 mg by mouth daily., Disp: , Rfl:    DULoxetine (CYMBALTA) 60 MG capsule, Take 60 mg by mouth at bedtime., Disp: , Rfl:    empagliflozin (JARDIANCE) 10 MG TABS tablet, Take 1 tablet (10 mg total) by mouth daily before breakfast., Disp: 30 tablet, Rfl: 3   ketotifen (ZADITOR) 0.025 % ophthalmic solution, Place 1 drop into both eyes 2 (two) times daily as needed (allergies)., Disp: , Rfl:    levothyroxine (SYNTHROID) 25 MCG tablet, Take 37.5 mcg by mouth daily before breakfast., Disp: , Rfl:    loratadine (CLARITIN) 10 MG tablet, Take 10 mg by mouth daily., Disp: , Rfl:    losartan (COZAAR) 25 MG tablet, Take 0.5 tablets (12.5 mg total) by mouth daily., Disp: 60 tablet, Rfl: 3   metoprolol tartrate (LOPRESSOR) 25 MG tablet, Take 0.5 tablets (12.5 mg total) by mouth 2 (two) times daily., Disp: 30 tablet, Rfl: 3   ondansetron (ZOFRAN) 4 MG tablet, Take 4 mg by mouth every 8 (eight) hours as needed for nausea or vomiting., Disp: , Rfl:    pantoprazole (PROTONIX) 40 MG tablet, Take 80 mg by mouth daily., Disp: , Rfl:    promethazine (PHENERGAN) 12.5 MG tablet, Take 12.5 mg by mouth every 4 (four) hours as needed for nausea or vomiting., Disp: , Rfl:    Rimegepant Sulfate (NURTEC) 75 MG TBDP, Take 75 mg by  mouth daily as needed (migraines)., Disp: , Rfl:    tiZANidine (ZANAFLEX) 4 MG tablet, Take 2-8 mg by mouth See admin instructions. Take 8 mg every night, may take 2 mg during the day as needed for migraines, Disp: , Rfl:    traZODone (DESYREL) 150 MG tablet, Take 150 mg by mouth at bedtime., Disp: , Rfl:    amiodarone (PACERONE) 200 MG tablet, Take 1 tablet (200 mg total) by mouth daily. X 3 days, then decrease to 200 mg BID x 7 days, then decrease to 200 mg daily (Patient not taking: Reported on 05/19/2021), Disp: 30 tablet, Rfl: 1   calcium carbonate (TUMS - DOSED IN MG ELEMENTAL CALCIUM) 500 MG chewable tablet, Take 1 tablet by mouth daily as needed for  heartburn, indigestion (Patient not taking: Reported on 05/19/2021), Disp: , Rfl:    montelukast (SINGULAIR) 10 MG tablet, Take 10 mg by mouth at bedtime., Disp: , Rfl:    traMADol (ULTRAM) 50 MG tablet, Take 1 tablet (50 mg total) by mouth every 6 (six) hours as needed for moderate pain. (Patient not taking: Reported on 05/19/2021), Disp: 30 tablet, Rfl: 0  Past Medical History: Past Medical History:  Diagnosis Date   Asthma    Colitis    Hypertension    Kidney stones    Migraines     Tobacco Use: Social History   Tobacco Use  Smoking Status Never  Smokeless Tobacco Never    Labs: Recent Review Flowsheet Data     Labs for ITP Cardiac and Pulmonary Rehab Latest Ref Rng & Units 04/06/2021 04/06/2021 04/06/2021 04/07/2021 04/30/2021   Cholestrol 100 - 199 mg/dL - - - - 032   LDLCALC 0 - 99 mg/dL - - - - 78   HDL >12 mg/dL - - - - 58   Trlycerides 0 - 149 mg/dL - - - - 248   Hemoglobin A1c 4.8 - 5.6 % - - - - -   PHART 7.350 - 7.450 7.385 7.307(L) 7.301(L) - -   PCO2ART 32.0 - 48.0 mmHg 39.0 47.3 47.5 - -   HCO3 20.0 - 28.0 mmol/L 23.6 23.9 23.7 - -   TCO2 22 - 32 mmol/L 25 25 25  - -   ACIDBASEDEF 0.0 - 2.0 mmol/L 2.0 3.0(H) 3.0(H) - -   O2SAT % 100.0 99.0 99.0 64.1 -       Capillary Blood Glucose: Lab Results  Component Value Date   GLUCAP 92 05/24/2021   GLUCAP 108 (H) 04/09/2021   GLUCAP 135 (H) 04/09/2021   GLUCAP 145 (H) 04/08/2021   GLUCAP 114 (H) 04/08/2021     Exercise Target Goals: Exercise Program Goal: Individual exercise prescription set using results from initial 6 min walk test and THRR while considering  patients activity barriers and safety.   Exercise Prescription Goal: Starting with aerobic activity 30 plus minutes a day, 3 days per week for initial exercise prescription. Provide home exercise prescription and guidelines that participant acknowledges understanding prior to discharge.  Activity Barriers & Risk Stratification:  Activity Barriers &  Cardiac Risk Stratification - 05/24/21 1305       Activity Barriers & Cardiac Risk Stratification   Activity Barriers Joint Problems;Shortness of Breath    Cardiac Risk Stratification High             6 Minute Walk:  6 Minute Walk     Row Name 05/24/21 1356         6 Minute Walk   Phase Initial  Distance 1300 feet     Walk Time 6 minutes     # of Rest Breaks 0     MPH 2.46     METS 2.73     RPE 12     VO2 Peak 9.58     Symptoms No     Resting HR 66 bpm     Resting BP 112/72     Resting Oxygen Saturation  95 %     Exercise Oxygen Saturation  during 6 min walk 96 %     Max Ex. HR 89 bpm     Max Ex. BP 148/76     2 Minute Post BP 120/72              Oxygen Initial Assessment:   Oxygen Re-Evaluation:   Oxygen Discharge (Final Oxygen Re-Evaluation):   Initial Exercise Prescription:  Initial Exercise Prescription - 05/24/21 1300       Date of Initial Exercise RX and Referring Provider   Date 05/24/21    Referring Provider Dr. Darcey Nora    Expected Discharge Date 08/20/21      Treadmill   MPH 1.8    Grade 0    Minutes 17      NuStep   Level 1    SPM 60    Minutes 22      Prescription Details   Frequency (times per week) 3    Duration Progress to 30 minutes of continuous aerobic without signs/symptoms of physical distress      Intensity   THRR 40-80% of Max Heartrate 60-120    Ratings of Perceived Exertion 11-13      Progression   Progression Continue progressive overload as per policy without signs/symptoms or physical distress.      Resistance Training   Training Prescription Yes    Weight 3    Reps 10-15             Perform Capillary Blood Glucose checks as needed.  Exercise Prescription Changes:   Exercise Comments:   Exercise Goals and Review:   Exercise Goals     Row Name 05/24/21 1359             Exercise Goals   Increase Physical Activity Yes       Intervention Provide advice, education, support and  counseling about physical activity/exercise needs.;Develop an individualized exercise prescription for aerobic and resistive training based on initial evaluation findings, risk stratification, comorbidities and participant's personal goals.       Expected Outcomes Short Term: Attend rehab on a regular basis to increase amount of physical activity.;Long Term: Add in home exercise to make exercise part of routine and to increase amount of physical activity.;Long Term: Exercising regularly at least 3-5 days a week.       Increase Strength and Stamina Yes       Intervention Provide advice, education, support and counseling about physical activity/exercise needs.;Develop an individualized exercise prescription for aerobic and resistive training based on initial evaluation findings, risk stratification, comorbidities and participant's personal goals.       Expected Outcomes Short Term: Increase workloads from initial exercise prescription for resistance, speed, and METs.;Short Term: Perform resistance training exercises routinely during rehab and add in resistance training at home;Long Term: Improve cardiorespiratory fitness, muscular endurance and strength as measured by increased METs and functional capacity (6MWT)       Able to understand and use rate of perceived exertion (RPE) scale Yes  Intervention Provide education and explanation on how to use RPE scale       Expected Outcomes Short Term: Able to use RPE daily in rehab to express subjective intensity level;Long Term:  Able to use RPE to guide intensity level when exercising independently       Knowledge and understanding of Target Heart Rate Range (THRR) Yes       Intervention Provide education and explanation of THRR including how the numbers were predicted and where they are located for reference       Expected Outcomes Short Term: Able to state/look up THRR;Long Term: Able to use THRR to govern intensity when exercising independently;Short Term:  Able to use daily as guideline for intensity in rehab       Able to check pulse independently Yes       Intervention Provide education and demonstration on how to check pulse in carotid and radial arteries.;Review the importance of being able to check your own pulse for safety during independent exercise       Expected Outcomes Short Term: Able to explain why pulse checking is important during independent exercise;Long Term: Able to check pulse independently and accurately       Understanding of Exercise Prescription Yes       Intervention Provide education, explanation, and written materials on patient's individual exercise prescription       Expected Outcomes Short Term: Able to explain program exercise prescription;Long Term: Able to explain home exercise prescription to exercise independently                Exercise Goals Re-Evaluation :    Discharge Exercise Prescription (Final Exercise Prescription Changes):   Nutrition:  Target Goals: Understanding of nutrition guidelines, daily intake of sodium 1500mg , cholesterol 200mg , calories 30% from fat and 7% or less from saturated fats, daily to have 5 or more servings of fruits and vegetables.  Biometrics:  Pre Biometrics - 05/24/21 1359       Pre Biometrics   Height 5\' 3"  (1.6 m)    Weight 165 lb 5.5 oz (75 kg)    Waist Circumference 39.5 inches    Hip Circumference 39 inches    Waist to Hip Ratio 1.01 %    BMI (Calculated) 29.3    Triceps Skinfold 24 mm    % Body Fat 41 %    Grip Strength 21 kg    Flexibility 8 in    Single Leg Stand 5 seconds              Nutrition Therapy Plan and Nutrition Goals:  Nutrition Therapy & Goals - 05/24/21 1310       Intervention Plan   Intervention Nutrition handout(s) given to patient.    Expected Outcomes Short Term Goal: Understand basic principles of dietary content, such as calories, fat, sodium, cholesterol and nutrients.             Nutrition Assessments:   Nutrition Assessments - 05/24/21 1308       MEDFICTS Scores   Pre Score 15            MEDIFICTS Score Key: ?70 Need to make dietary changes  40-70 Heart Healthy Diet ? 40 Therapeutic Level Cholesterol Diet   Picture Your Plate Scores: D34-534 Unhealthy dietary pattern with much room for improvement. 41-50 Dietary pattern unlikely to meet recommendations for good health and room for improvement. 51-60 More healthful dietary pattern, with some room for improvement.  >60 Healthy dietary pattern, although there  may be some specific behaviors that could be improved.    Nutrition Goals Re-Evaluation:   Nutrition Goals Discharge (Final Nutrition Goals Re-Evaluation):   Psychosocial: Target Goals: Acknowledge presence or absence of significant depression and/or stress, maximize coping skills, provide positive support system. Participant is able to verbalize types and ability to use techniques and skills needed for reducing stress and depression.  Initial Review & Psychosocial Screening:  Initial Psych Review & Screening - 05/24/21 1306       Initial Review   Current issues with Current Anxiety/Panic   On Cymbalta     Family Dynamics   Good Support System? Yes    Comments Her support system includes her children and her husband are her main support system.      Barriers   Psychosocial barriers to participate in program There are no identifiable barriers or psychosocial needs.      Screening Interventions   Interventions Encouraged to exercise    Expected Outcomes Long Term goal: The participant improves quality of Life and PHQ9 Scores as seen by post scores and/or verbalization of changes;Short Term goal: Identification and review with participant of any Quality of Life or Depression concerns found by scoring the questionnaire.             Quality of Life Scores:  Quality of Life - 05/24/21 1401       Quality of Life   Select Quality of Life      Quality of Life  Scores   Health/Function Pre 23.7 %    Socioeconomic Pre 28.07 %    Psych/Spiritual Pre 26.57 %    Family Pre 28.8 %    GLOBAL Pre 25.94 %            Scores of 19 and below usually indicate a poorer quality of life in these areas.  A difference of  2-3 points is a clinically meaningful difference.  A difference of 2-3 points in the total score of the Quality of Life Index has been associated with significant improvement in overall quality of life, self-image, physical symptoms, and general health in studies assessing change in quality of life.  PHQ-9: Recent Review Flowsheet Data     Depression screen [facility] 2/9 05/24/2021   Decreased Interest 0   Down, Depressed, Hopeless 0   PHQ - 2 Score 0   Altered sleeping 0   Tired, decreased energy 1   Change in appetite 0   Feeling bad or failure about yourself  0   Trouble concentrating 2    Moving slowly or fidgety/restless 1   Suicidal thoughts 0   PHQ-9 Score 4   Difficult doing work/chores Not difficult at all      Interpretation of Total Score  Total Score Depression Severity:  1-4 = Minimal depression, 5-9 = Mild depression, 10-14 = Moderate depression, 15-19 = Moderately severe depression, 20-27 = Severe depression   Psychosocial Evaluation and Intervention:  Psychosocial Evaluation - 05/24/21 1420       Psychosocial Evaluation & Interventions   Interventions Encouraged to exercise with the program and follow exercise prescription    Comments Pt has no barriers to participating in cardiac rehab. She has no identifiable psychosocial issues. She does have anxiety which is managed by Cymbalta. She reports that her Cymbalta was doubled from 30 mg daily to 60 mg daily about a year and a half ago. Her grandson commited suicide and this was an extremely tough time for her. She denies any current  depression. She scored a 4 on her PHQ-9. She relates all of this to her current health. She is slowly getting her energy back and is feeling  better everyday. 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 She does have trouble concentrating when reading due to this. She reports that she has a good support system with her three children and her husband. Her goals while in the program are to lose weight, decrease her SOB with exertion, and to be able to resume her outdoor activities such as fishing and kayaking. She reports that she has already made lifestyle changes since her STEMI, and she has cleaned her diet up a lot. She is eager to start the program and to further improve her lifestyle.    Expected Outcomes Pt's anxiety will continue to be treated with Cymbalta, and pt will have no other identifiable psychosocial issues.    Continue Psychosocial Services  No Follow up required             Psychosocial Re-Evaluation:   Psychosocial Discharge (Final Psychosocial Re-Evaluation):   Vocational Rehabilitation: Provide vocational rehab assistance to qualifying candidates.   Vocational Rehab Evaluation & Intervention:  Vocational Rehab - 05/24/21 1313       Initial Vocational Rehab Evaluation & Intervention   Assessment shows need for Vocational Rehabilitation No   She is retired and not going back to work            Education: Education Goals: Education classes will be provided on a weekly basis, covering required topics. Participant will state understanding/return demonstration of topics presented.  Learning Barriers/Preferences:  Learning Barriers/Preferences - 05/24/21 1310       Learning Barriers/Preferences   Learning Barriers None    Learning Preferences Skilled Demonstration             Education Topics: Hypertension, Hypertension Reduction -Define heart disease and high blood pressure. Discus how high blood pressure affects the body and ways to reduce high blood pressure.   Exercise and Your Heart -Discuss why  it is important to exercise, the FITT principles of exercise, normal and abnormal responses to exercise, and how to exercise safely.   Angina -Discuss definition of angina, causes of angina, treatment of angina, and how to decrease risk of having angina.   Cardiac Medications -Review what the following cardiac medications are used for, how they affect the body, and side effects that may occur when taking the medications.  Medications include Aspirin, Beta blockers, calcium channel blockers, ACE Inhibitors, angiotensin receptor blockers, diuretics, digoxin, and antihyperlipidemics.   Congestive Heart Failure -Discuss the definition of CHF, how to live with CHF, the signs and symptoms of CHF, and how keep track of weight and sodium intake.   Heart Disease and Intimacy -Discus the effect sexual activity has on the heart, how changes occur during intimacy as we age, and safety during sexual activity.   Smoking Cessation / COPD -Discuss different methods to quit smoking, the health benefits of quitting smoking, and the definition of COPD.   Nutrition I: Fats -Discuss the types of cholesterol, what cholesterol does to the heart, and how cholesterol levels can be controlled.   Nutrition II: Labels -Discuss the different components of food labels and how to read food label   Heart Parts/Heart Disease and PAD -Discuss the anatomy of the heart, the pathway of blood circulation through the heart, and  these are affected by heart disease.   Stress I: Signs and Symptoms -Discuss the causes of stress, how stress may lead to anxiety and depression, and ways to limit stress.   Stress II: Relaxation -Discuss different types of relaxation techniques to limit stress.   Warning Signs of Stroke / TIA -Discuss definition of a stroke, what the signs and symptoms are of a stroke, and how to identify when someone is having stroke.   Knowledge Questionnaire Score:  Knowledge Questionnaire Score  - 05/24/21 1311       Knowledge Questionnaire Score   Pre Score 22/24             Core Components/Risk Factors/Patient Goals at Admission:  Personal Goals and Risk Factors at Admission - 05/24/21 1314       Core Components/Risk Factors/Patient Goals on Admission    Weight Management Yes;Weight Loss    Intervention Weight Management: Develop a combined nutrition and exercise program designed to reach desired caloric intake, while maintaining appropriate intake of nutrient and fiber, sodium and fats, and appropriate energy expenditure required for the weight goal.;Weight Management: Provide education and appropriate resources to help participant work on and attain dietary goals.;Weight Management/Obesity: Establish reasonable short term and long term weight goals.;Obesity: Provide education and appropriate resources to help participant work on and attain dietary goals.    Expected Outcomes Short Term: Continue to assess and modify interventions until short term weight is achieved;Long Term: Adherence to nutrition and physical activity/exercise program aimed toward attainment of established weight goal;Weight Maintenance: Understanding of the daily nutrition guidelines, which includes 25-35% calories from fat, 7% or less cal from saturated fats, less than 200mg  cholesterol, less than 1.5gm of sodium, & 5 or more servings of fruits and vegetables daily;Weight Loss: Understanding of general recommendations for a balanced deficit meal plan, which promotes 1-2 lb weight loss per week and includes a negative energy balance of [phone] kcal/d;Understanding recommendations for meals to include 15-35% energy as protein, 25-35% energy from fat, 35-60% energy from carbohydrates, less than 200mg  of dietary cholesterol, 20-35 gm of total fiber daily;Understanding of distribution of calorie intake throughout the day with the consumption of 4-5 meals/snacks    Improve shortness of breath with ADL's Yes     Intervention Provide education, individualized exercise plan and daily activity instruction to help decrease symptoms of SOB with activities of daily living.    Expected Outcomes Short Term: Improve cardiorespiratory fitness to achieve a reduction of symptoms when performing ADLs;Long Term: Be able to perform more ADLs without symptoms or delay the onset of symptoms    Personal Goal Other Yes    Personal Goal Be able to resume outdoor activities such as fishing and kayaking.    Intervention Attend CR three times per week and begin a home exercise program.    Expected Outcomes Pt will be able to resume her previous outdoor activities without issues.             Core Components/Risk Factors/Patient Goals Review:    Core Components/Risk Factors/Patient Goals at Discharge (Final Review):    ITP Comments:   Comments: Patient arrived for 1st visit/orientation/education at 1230. Patient was referred to CR by Dr. Darcey Nora due to STEMI (I21.3) and S/P CABG x3 (Z95.1). During orientation advised patient on arrival and appointment times what to wear, what to do before, during and after exercise. Reviewed attendance and class policy.  Pt is scheduled to return Cardiac Rehab on 05/26/2021 at 1445. Pt was advised to come  to class 15 minutes before class starts.  Discussed RPE/Dpysnea scales. Patient participated in warm up stretches. Patient was able to complete 6 minute walk test.  Telemetry:NSR w/ RBBB. Patient was measured for the equipment. Discussed equipment safety with patient. Took patient pre-anthropometric measurements. Patient finished visit at 1350.

## 2021-05-26 ENCOUNTER — Encounter (HOSPITAL_COMMUNITY): Payer: Medicare Other

## 2021-05-26 ENCOUNTER — Encounter (HOSPITAL_COMMUNITY)
Admission: RE | Admit: 2021-05-26 | Discharge: 2021-05-26 | Disposition: A | Discharge status: Home / Self Care | Payer: Medicare Other | Source: Ambulatory Visit | Attending: Cardiology | Admitting: Cardiology

## 2021-05-26 DIAGNOSIS — I213 ST elevation (STEMI) myocardial infarction of unspecified site: Secondary | ICD-10-CM

## 2021-05-26 DIAGNOSIS — Z951 Presence of aortocoronary bypass graft: Secondary | ICD-10-CM

## 2021-05-26 LAB — GLUCOSE, CAPILLARY: Glucose-Capillary: 123 mg/dL — ABNORMAL HIGH (ref 70–99)

## 2021-05-26 NOTE — Progress Notes (Signed)
Daily Session Note ? ?Patient Details  ?Name: Meredith Guerra ?MRN: [id number] ?Date of Birth: 1950-05-26 ?Referring Provider:   ?Flowsheet Row CARDIAC REHAB PHASE II ORIENTATION from 05/24/2021 in [place]  ?Referring Provider Dr. Darcey Nora  ? ?  ? ? ?Encounter Date: 05/26/2021 ? ?Check In: ? Session Check In - 05/26/21 1445   ? ?  ? Check-In  ? Supervising physician immediately available to respond to emergencies [facility] MD immediately available   ? Physician(s) Dr. Domenic Polite   ? Location [facility]   ? Staff Present Geanie Cooley, RN;Heather Otho Ket, BS, Exercise Physiologist;Debra Wynetta Emery, RN, BSN   ? Virtual Visit No   ? Medication changes reported     No   ? Fall or balance concerns reported    No   ? Tobacco Cessation No Change   ? Warm-up and Cool-down Performed as group-led instruction   ? Resistance Training Performed Yes   ? VAD Patient? No   ? PAD/SET Patient? No   ?  ? Pain Assessment  ? Currently in Pain? No/denies   ? Multiple Pain Sites No   ? ?  ?  ? ?  ? ? ?Capillary Blood Glucose: ?Results for orders placed or performed during the hospital encounter of 05/26/21 (from the past 24 hour(s))  ?Glucose, capillary     Status: Abnormal  ? Collection Time: 05/26/21  2:32 PM  ?Result Value Ref Range  ? Glucose-Capillary 123 (H) 70 - 99 mg/dL  ? ? ? ? ?Social History  ? ?Tobacco Use  ?Smoking Status Never  ?Smokeless Tobacco Never  ? ? ?Goals Met:  ?Exercise tolerated well ?No report of concerns or symptoms today ?Strength training completed today ? ?Goals Unmet:  ?Not Applicable ? ?Comments: check out @ 3:45pm ? ? ?Dr. Carlyle Dolly is Medical Director for [place] ?

## 2021-05-27 ENCOUNTER — Other Ambulatory Visit: Payer: Self-pay | Admitting: Cardiology

## 2021-05-27 DIAGNOSIS — I480 Paroxysmal atrial fibrillation: Secondary | ICD-10-CM

## 2021-05-28 ENCOUNTER — Other Ambulatory Visit: Payer: Self-pay | Admitting: Cardiology

## 2021-05-28 ENCOUNTER — Encounter (HOSPITAL_COMMUNITY)
Admission: RE | Admit: 2021-05-28 | Discharge: 2021-05-28 | Disposition: A | Discharge status: Home / Self Care | Payer: Medicare Other | Source: Ambulatory Visit | Attending: Cardiology | Admitting: Cardiology

## 2021-05-28 ENCOUNTER — Encounter (HOSPITAL_COMMUNITY): Payer: Medicare Other

## 2021-05-28 DIAGNOSIS — I213 ST elevation (STEMI) myocardial infarction of unspecified site: Secondary | ICD-10-CM

## 2021-05-28 DIAGNOSIS — Z951 Presence of aortocoronary bypass graft: Secondary | ICD-10-CM

## 2021-05-28 DIAGNOSIS — I480 Paroxysmal atrial fibrillation: Secondary | ICD-10-CM

## 2021-05-28 LAB — GLUCOSE, CAPILLARY: Glucose-Capillary: 120 mg/dL — ABNORMAL HIGH (ref 70–99)

## 2021-05-28 NOTE — Telephone Encounter (Signed)
No. Discontinued. Please tell the pharmacy know.

## 2021-05-28 NOTE — Progress Notes (Signed)
Daily Session Note ? ?Patient Details  ?Name: Meredith Guerra ?MRN: [id number] ?Date of Birth: January 08, 1951 ?Referring Provider:   ?Flowsheet Row CARDIAC REHAB PHASE II ORIENTATION from 05/24/2021 in [place]  ?Referring Provider Dr. Darcey Nora  ? ?  ? ? ?Encounter Date: 05/28/2021 ? ?Check In: ? Session Check In - 05/28/21 1445   ? ?  ? Check-In  ? Supervising physician immediately available to respond to emergencies [facility] MD immediately available   ? Physician(s) Dr. Domenic Polite   ? Location [facility]   ? Staff Present Redge Gainer, BS, Exercise Physiologist;Nakea Gouger Kris Mouton, MS, ACSM-CEP, Exercise Physiologist;Phyllis Billingsley, RN   ? Virtual Visit No   ? Medication changes reported     No   ? Fall or balance concerns reported    No   ? Tobacco Cessation No Change   ? Warm-up and Cool-down Performed as group-led instruction   ? Resistance Training Performed Yes   ? VAD Patient? No   ? PAD/SET Patient? No   ?  ? Pain Assessment  ? Currently in Pain? No/denies   ? Multiple Pain Sites No   ? ?  ?  ? ?  ? ? ?Capillary Blood Glucose: ?Results for orders placed or performed during the hospital encounter of 05/28/21 (from the past 24 hour(s))  ?Glucose, capillary     Status: Abnormal  ? Collection Time: 05/28/21  2:27 PM  ?Result Value Ref Range  ? Glucose-Capillary 120 (H) 70 - 99 mg/dL  ? ? ? ? ?Social History  ? ?Tobacco Use  ?Smoking Status Never  ?Smokeless Tobacco Never  ? ? ?Goals Met:  ?Independence with exercise equipment ?Exercise tolerated well ?No report of concerns or symptoms today ?Strength training completed today ? ?Goals Unmet:  ?Not Applicable ? ?Comments: checkout time Is 1545 ? ? ?Dr. Carlyle Dolly is Medical Director for [place] ?

## 2021-05-28 NOTE — Telephone Encounter (Signed)
Pt is requesting a refill. 

## 2021-05-28 NOTE — Telephone Encounter (Signed)
Spoke with the patient. No refill needed. Amiodarone is now to be stopped. ? ?Thanks ?MJP ? ?

## 2021-05-28 NOTE — Telephone Encounter (Signed)
Okay to refill? 

## 2021-05-31 ENCOUNTER — Encounter (HOSPITAL_COMMUNITY): Payer: Medicare Other

## 2021-05-31 ENCOUNTER — Ambulatory Visit (INDEPENDENT_AMBULATORY_CARE_PROVIDER_SITE_OTHER): Payer: Medicare Other | Admitting: Gastroenterology

## 2021-06-02 ENCOUNTER — Encounter (HOSPITAL_COMMUNITY): Payer: Medicare Other

## 2021-06-02 ENCOUNTER — Encounter (HOSPITAL_COMMUNITY)
Admission: RE | Admit: 2021-06-02 | Discharge: 2021-06-02 | Disposition: A | Discharge status: Home / Self Care | Payer: Medicare Other | Source: Ambulatory Visit | Attending: Cardiology | Admitting: Cardiology

## 2021-06-02 DIAGNOSIS — Z951 Presence of aortocoronary bypass graft: Secondary | ICD-10-CM

## 2021-06-02 DIAGNOSIS — I213 ST elevation (STEMI) myocardial infarction of unspecified site: Secondary | ICD-10-CM | POA: Diagnosis not present

## 2021-06-02 LAB — GLUCOSE, CAPILLARY: Glucose-Capillary: 94 mg/dL (ref 70–99)

## 2021-06-02 NOTE — Progress Notes (Signed)
Daily Session Note ? ?Patient Details  ?Name: Meredith Guerra ?MRN: [id number] ?Date of Birth: 12-12-1950 ?Referring Provider:   ?Flowsheet Row CARDIAC REHAB PHASE II ORIENTATION from 05/24/2021 in [place]  ?Referring Provider Dr. Darcey Nora  ? ?  ? ? ?Encounter Date: 06/02/2021 ? ?Check In: ? Session Check In - 06/02/21 1445   ? ?  ? Check-In  ? Supervising physician immediately available to respond to emergencies [facility] MD immediately available   ? Physician(s) Dr Harl Bowie   ? Location [facility]   ? Staff Present Hoy Register, MS, ACSM-CEP, Exercise Physiologist;Heather Zigmund Daniel, Exercise Physiologist;Thelton Graca Hassell Done, RN, BSN   ? Virtual Visit No   ? Medication changes reported     No   ? Fall or balance concerns reported    No   ? Tobacco Cessation No Change   ? Warm-up and Cool-down Performed as group-led instruction   ? Resistance Training Performed Yes   ? VAD Patient? No   ? PAD/SET Patient? No   ?  ? Pain Assessment  ? Currently in Pain? No/denies   ? Multiple Pain Sites No   ? ?  ?  ? ?  ? ? ?Capillary Blood Glucose: ?Results for orders placed or performed during the hospital encounter of 06/02/21 (from the past 24 hour(s))  ?Glucose, capillary     Status: None  ? Collection Time: 06/02/21  2:32 PM  ?Result Value Ref Range  ? Glucose-Capillary 94 70 - 99 mg/dL  ? ? ? ? ?Social History  ? ?Tobacco Use  ?Smoking Status Never  ?Smokeless Tobacco Never  ? ? ?Goals Met:  ?Independence with exercise equipment ?Exercise tolerated well ?No report of concerns or symptoms today ?Strength training completed today ? ?Goals Unmet:  ?Not Applicable ? ?Comments: Check out 1545 ? ? ?Dr. Carlyle Dolly is Medical Director for [place] ?

## 2021-06-04 ENCOUNTER — Encounter (HOSPITAL_COMMUNITY): Payer: Medicare Other

## 2021-06-04 ENCOUNTER — Encounter (HOSPITAL_COMMUNITY)
Admission: RE | Admit: 2021-06-04 | Discharge: 2021-06-04 | Disposition: A | Discharge status: Home / Self Care | Payer: Medicare Other | Source: Ambulatory Visit | Attending: Cardiology | Admitting: Cardiology

## 2021-06-04 DIAGNOSIS — I213 ST elevation (STEMI) myocardial infarction of unspecified site: Secondary | ICD-10-CM | POA: Diagnosis not present

## 2021-06-04 DIAGNOSIS — Z951 Presence of aortocoronary bypass graft: Secondary | ICD-10-CM

## 2021-06-04 NOTE — Progress Notes (Signed)
Daily Session Note ? ?Patient Details  ?Name: Meredith Guerra ?MRN: [id number] ?Date of Birth: 1950-07-18 ?Referring Provider:   ?Flowsheet Row CARDIAC REHAB PHASE II ORIENTATION from 05/24/2021 in [facility]  ?Referring Provider Dr. Darcey Nora  ? ?  ? ? ?Encounter Date: 06/04/2021 ? ?Check In: ? Session Check In - 06/04/21 1445   ? ?  ? Check-In  ? Supervising physician immediately available to respond to emergencies [facility] MD immediately available   ? Physician(s) Dr Harl Bowie   ? Location [facility]   ? Staff Present Geanie Cooley, RN;Debra Wynetta Emery, RN, Joanette Gula, RN, Madlyn Frankel, RN, BSN   ? Virtual Visit No   ? Medication changes reported     No   ? Fall or balance concerns reported    No   ? Tobacco Cessation No Change   ? Warm-up and Cool-down Performed as group-led instruction   ? Resistance Training Performed Yes   ? VAD Patient? No   ? PAD/SET Patient? No   ?  ? Pain Assessment  ? Currently in Pain? No/denies   ? Multiple Pain Sites No   ? ?  ?  ? ?  ? ? ?Capillary Blood Glucose: ?No results found for this or any previous visit (from the past 24 hour(s)). ? ? ? ?Social History  ? ?Tobacco Use  ?Smoking Status Never  ?Smokeless Tobacco Never  ? ? ?Goals Met:  ?Independence with exercise equipment ?Exercise tolerated well ?No report of concerns or symptoms today ?Strength training completed today ? ?Goals Unmet:  ?Not Applicable ? ?Comments: checkout 1545 ? ? ? ?Dr. Carlyle Dolly is Medical Director for [facility] ?

## 2021-06-07 ENCOUNTER — Encounter (HOSPITAL_COMMUNITY)
Admission: RE | Admit: 2021-06-07 | Discharge: 2021-06-07 | Disposition: A | Discharge status: Home / Self Care | Payer: Medicare Other | Source: Ambulatory Visit | Attending: Cardiology | Admitting: Cardiology

## 2021-06-07 ENCOUNTER — Encounter (HOSPITAL_COMMUNITY): Payer: Medicare Other

## 2021-06-07 DIAGNOSIS — I213 ST elevation (STEMI) myocardial infarction of unspecified site: Secondary | ICD-10-CM

## 2021-06-07 DIAGNOSIS — Z951 Presence of aortocoronary bypass graft: Secondary | ICD-10-CM

## 2021-06-07 NOTE — Progress Notes (Signed)
Daily Session Note ? ?Patient Details  ?Name: Meredith Guerra ?MRN: [id number] ?Date of Birth: [birth year] ?Referring Provider:   ?Flowsheet Row CARDIAC REHAB PHASE II ORIENTATION from 05/24/2021 in [place]  ?Referring Provider Dr. Darcey Nora  ? ?  ? ? ?Encounter Date: 06/07/2021 ? ?Check In: ? Session Check In - 06/07/21 1442   ? ?  ? Check-In  ? Supervising physician immediately available to respond to emergencies [facility] MD immediately available   ? Physician(s) Dr Harl Bowie   ? Location [facility]   ? Staff Present Redge Gainer, BS, Exercise Physiologist;Debra Wynetta Emery, RN, Joanette Gula, RN, Madlyn Frankel, RN, BSN   ? Virtual Visit No   ? Medication changes reported     No   ? Fall or balance concerns reported    No   ? Tobacco Cessation No Change   ? Warm-up and Cool-down Performed as group-led instruction   ? Resistance Training Performed Yes   ? VAD Patient? No   ? PAD/SET Patient? No   ?  ? Pain Assessment  ? Currently in Pain? No/denies   ? Multiple Pain Sites No   ? ?  ?  ? ?  ? ? ?Capillary Blood Glucose: ?No results found for this or any previous visit (from the past 24 hour(s)). ? ? ? ?Social History  ? ?Tobacco Use  ?Smoking Status Never  ?Smokeless Tobacco Never  ? ? ?Goals Met:  ?Independence with exercise equipment ?Exercise tolerated well ?No report of concerns or symptoms today ?Strength training completed today ? ?Goals Unmet:  ?Not Applicable ? ?Comments: Check out 1545 ? ? ?Dr. Carlyle Dolly is Medical Director for [place] ?

## 2021-06-09 ENCOUNTER — Telehealth: Payer: Self-pay | Admitting: Cardiology

## 2021-06-09 ENCOUNTER — Encounter (HOSPITAL_COMMUNITY)
Admission: RE | Admit: 2021-06-09 | Discharge: 2021-06-09 | Disposition: A | Discharge status: Home / Self Care | Payer: Medicare Other | Source: Ambulatory Visit | Attending: Cardiology | Admitting: Cardiology

## 2021-06-09 ENCOUNTER — Telehealth: Payer: Self-pay

## 2021-06-09 ENCOUNTER — Encounter (HOSPITAL_COMMUNITY): Payer: Medicare Other

## 2021-06-09 DIAGNOSIS — I213 ST elevation (STEMI) myocardial infarction of unspecified site: Secondary | ICD-10-CM

## 2021-06-09 DIAGNOSIS — Z951 Presence of aortocoronary bypass graft: Secondary | ICD-10-CM

## 2021-06-09 NOTE — Telephone Encounter (Signed)
LVM with patient to schedule follow up tomorrow 3/23 for chest pain & sob.  ?

## 2021-06-09 NOTE — Telephone Encounter (Signed)
From pt

## 2021-06-09 NOTE — Telephone Encounter (Signed)
Appt tomorrow w/me. ? ?Thanks ?MJP ? ?

## 2021-06-09 NOTE — Telephone Encounter (Signed)
Debby with [facility] called and stated that the pt is there for cardiac rehab. The last 3 days the pt has been having chest pressure radiating to her neck. She felt this on the treadmill. Pt was also SOB, vitals and teli strips were normal. Chest pain went away after she stopped walking on the treadmill.  ? ?Debby [phone] ?

## 2021-06-09 NOTE — Progress Notes (Signed)
Daily Session Note ? ?Patient Details  ?Name: Meredith Guerra ?MRN: [id number] ?Date of Birth: 12/09/1950 ?Referring Provider:   ?Flowsheet Row CARDIAC REHAB PHASE II ORIENTATION from 05/24/2021 in [place]  ?Referring Provider Dr. Darcey Nora  ? ?  ? ? ?Encounter Date: 06/09/2021 ? ?Check In: ? Session Check In - 06/09/21 1440   ? ?  ? Check-In  ? Supervising physician immediately available to respond to emergencies [facility] MD immediately available   ? Physician(s) Dr Domenic Polite   ? Location [facility]   ? Staff Present Geanie Cooley, RN;Heather Otho Ket, [place], Exercise Physiologist;Decklin Weddington Hassell Done, RN, Bjorn Loser, MS, ACSM-CEP, Exercise Physiologist   ? Virtual Visit No   ? Medication changes reported     No   ? Fall or balance concerns reported    No   ? Tobacco Cessation No Change   ? Warm-up and Cool-down Performed as group-led instruction   ? Resistance Training Performed Yes   ? VAD Patient? No   ? PAD/SET Patient? No   ?  ? Pain Assessment  ? Currently in Pain? No/denies   ? Multiple Pain Sites No   ? ?  ?  ? ?  ? ? ?Capillary Blood Glucose: ?No results found for this or any previous visit (from the past 24 hour(s)). ? ? ? ?Social History  ? ?Tobacco Use  ?Smoking Status Never  ?Smokeless Tobacco Never  ? ? ?Goals Met:  ?Independence with exercise equipment ?Exercise tolerated well ?No report of concerns or symptoms today ?Strength training completed today ? ?Goals Unmet:  ?Not Applicable ? ?Comments: Checkout at 42. ? ? ?Dr. Carlyle Dolly is Medical Director for [place] ?

## 2021-06-10 ENCOUNTER — Other Ambulatory Visit: Payer: Self-pay

## 2021-06-10 ENCOUNTER — Encounter: Payer: Self-pay | Admitting: Cardiology

## 2021-06-10 ENCOUNTER — Ambulatory Visit: Payer: Medicare Other | Admitting: Cardiology

## 2021-06-10 VITALS — BP 125/73 | HR 74 | Temp 97.8°F | Resp 16 | Ht 63.0 in | Wt 164.0 lb

## 2021-06-10 DIAGNOSIS — I25110 Atherosclerotic heart disease of native coronary artery with unstable angina pectoris: Secondary | ICD-10-CM

## 2021-06-10 DIAGNOSIS — R072 Precordial pain: Secondary | ICD-10-CM

## 2021-06-10 DIAGNOSIS — E782 Mixed hyperlipidemia: Secondary | ICD-10-CM

## 2021-06-10 MED ORDER — METOPROLOL SUCCINATE ER 50 MG PO TB24
50.0000 mg | ORAL_TABLET | Freq: Every day | ORAL | 3 refills | Status: DC
Start: 2021-06-10 — End: 2021-06-30

## 2021-06-10 MED ORDER — NITROGLYCERIN 0.4 MG SL SUBL: 0.4000 mg | SUBLINGUAL_TABLET | SUBLINGUAL | 1 refills | Status: AC | PRN

## 2021-06-10 NOTE — Progress Notes (Signed)
? ?Follow up visit ? ?Subjective:  ? ?Meredith Guerra, female    DOB: September 09, 1950, 71 y.o.   MRN: [id number] ? ? ? ?HPI ? ?Chief Complaint  ?Patient presents with  ? Chest Pain  ? Shortness of Breath  ? Follow-up  ? ? ?71 y.o. Caucasian female  with hypertension, mixed hyperlipidemia, asthma, remote history of Takotsubo cardiomyopathy, s/p CABGX3 (LIMA-LAD, SVG-RI-SVG-OM) for STEMI presentation while recovering from [place] (03/2021), brief postop A. fib, now resolved. ? ?Patient has been doing well since her bypass surgery.  However, few days ago, she had an episode of chest tightness and shortness of breath that lasted for a few hours, and then resolved on its own.  Symptoms worsened.  Today, but much less in intensity, episode of.  Subsequently, patient was at cardiac rehab and similar pain recurred while walking on the treadmill, and lasted for up to 45 minutes, then resolved.  I personally reviewed EKG tracings from cardiac rehab, which essentially show IVCD on exercise, with no further assessment possible for ischemia.  Patient is not having any chest pain symptoms at this time. ? ? ? ?Current Outpatient Medications:  ?  acetaminophen (TYLENOL) 500 MG tablet, Take 1,000 mg by mouth every 6 (six) hours as needed for mild pain, headache or fever., Disp: , Rfl:  ?  albuterol (PROVENTIL) 2 MG tablet, Take 2 mg by mouth 3 (three) times daily as needed for wheezing or shortness of breath., Disp: , Rfl:  ?  albuterol (VENTOLIN HFA) 108 (90 Base) MCG/ACT inhaler, Inhale 2 puffs into the lungs in the morning and at bedtime., Disp: , Rfl:  ?  amiodarone (PACERONE) 200 MG tablet, Take 1 tablet (200 mg total) by mouth daily. TID for 7 days, then BID for 7 days, then once daily, Disp: 90 tablet, Rfl: 1 ?  amLODipine (NORVASC) 2.5 MG tablet, Take 1 tablet (2.5 mg total) by mouth daily., Disp: 60 tablet, Rfl: 3 ?  aspirin 81 MG EC tablet, Take 1 tablet (81 mg total) by mouth daily., Disp: 90 tablet, Rfl: 3 ?  atorvastatin (LIPITOR)  80 MG tablet, Take 1 tablet (80 mg total) by mouth daily., Disp: 90 tablet, Rfl: 3 ?  budesonide-formoterol (SYMBICORT) 160-4.5 MCG/ACT inhaler, Inhale 2 puffs into the lungs 2 (two) times daily., Disp: , Rfl:  ?  butalbital-acetaminophen-caffeine (FIORICET) 50-325-40 MG tablet, Take 1 tablet by mouth 2 (two) times daily as needed for migraine., Disp: , Rfl:  ?  Cholecalciferol (VITAMIN D-3) 25 MCG (1000 UT) CAPS, Take 1,000 Units by mouth daily., Disp: , Rfl:  ?  clopidogrel (PLAVIX) 75 MG tablet, Take 1 tablet (75 mg total) by mouth daily., Disp: 90 tablet, Rfl: 3 ?  DULoxetine (CYMBALTA) 30 MG capsule, Take 30 mg by mouth daily., Disp: , Rfl:  ?  DULoxetine (CYMBALTA) 60 MG capsule, Take 60 mg by mouth at bedtime., Disp: , Rfl:  ?  empagliflozin (JARDIANCE) 10 MG TABS tablet, Take 1 tablet (10 mg total) by mouth daily before breakfast., Disp: 30 tablet, Rfl: 3 ?  ketotifen (ZADITOR) 0.025 % ophthalmic solution, Place 1 drop into both eyes 2 (two) times daily as needed (allergies)., Disp: , Rfl:  ?  levothyroxine (SYNTHROID) 25 MCG tablet, Take 37.5 mcg by mouth daily before breakfast., Disp: , Rfl:  ?  loratadine (CLARITIN) 10 MG tablet, Take 10 mg by mouth daily., Disp: , Rfl:  ?  losartan (COZAAR) 25 MG tablet, Take 0.5 tablets (12.5 mg total) by mouth daily., Disp:  60 tablet, Rfl: 3 ?  metoprolol tartrate (LOPRESSOR) 25 MG tablet, Take 0.5 tablets (12.5 mg total) by mouth 2 (two) times daily., Disp: 30 tablet, Rfl: 3 ?  montelukast (SINGULAIR) 10 MG tablet, Take 10 mg by mouth at bedtime., Disp: , Rfl:  ?  ondansetron (ZOFRAN) 4 MG tablet, Take 4 mg by mouth every 8 (eight) hours as needed for nausea or vomiting., Disp: , Rfl:  ?  pantoprazole (PROTONIX) 40 MG tablet, Take 80 mg by mouth daily., Disp: , Rfl:  ?  promethazine (PHENERGAN) 12.5 MG tablet, Take 12.5 mg by mouth every 4 (four) hours as needed for nausea or vomiting., Disp: , Rfl:  ?  Rimegepant Sulfate (NURTEC) 75 MG TBDP, Take 75 mg by mouth daily  as needed (migraines)., Disp: , Rfl:  ?  tiZANidine (ZANAFLEX) 4 MG tablet, Take 2-8 mg by mouth See admin instructions. Take 8 mg every night, may take 2 mg during the day as needed for migraines, Disp: , Rfl:  ?  traZODone (DESYREL) 150 MG tablet, Take 150 mg by mouth at bedtime., Disp: , Rfl:  ? ? ? ?Cardiovascular & other pertient studies: ? ?EKG 06/10/2021: ?Sinus rhythm 67 bpm  ?Old anteroseptal infarct ?Diffuse nonspecific T-abnormality ? ?Op note 04/06/2021 (Dr Prescott Gum): ?1.  Coronary artery bypass grafting x3 (left internal mammary artery to LAD, saphenous vein graft to ramus intermedius, saphenous vein graft to circumflex marginal).  ?2.  Endoscopic harvest of right leg greater saphenous vein. ? ?Cardiac MRI 03/31/2021 ?1.  No late gadolinium enhancement, suggesting myocardium is viable ?2. Normal LV size with mild systolic dysfunction (EF 44%). Apical ?hypokinesis ?3.  Small RV size with normal systolic function (EF 62%) ? ?Echocardiogram 03/30/2021: ?1.Left ventricular ejection fraction, by estimation, is 30 to 35%. The  ?left ventricle has moderately decreased function. The left ventricle  ?demonstrates regional wall motion abnormalities (see scoring  ?diagram/findings for description). Left ventricular  ? diastolic parameters are consistent with Grade I diastolic dysfunction  ?(impaired relaxation). There is akinesis of the left ventricular,  ?mid-apical anteroseptal wall. There is severe hypokinesis of the left  ?ventricular, mid-apical inferolateral wall.  ? 2. Right ventricular systolic function is low normal. The right  ?ventricular size is normal.  ? 3. The mitral valve is normal in structure. No evidence of mitral valve  ?regurgitation. No evidence of mitral stenosis.  ? 4. The aortic valve is tricuspid. Aortic valve regurgitation is not  ? ?Coronary angiogram 03/29/2021: ?LM: 90% distal stenosis at the trifurcation ?LAD: Proximal focal 80% stenosis at bifurcation with large septal perforator branch,  focal mid 60% stenosis ?Ramus intermedius: No significant disease ?Left circumflex: Mid 30% stenosis ?RCA: Moderate diffuse disease in small caliber RPDA ?  ?Aspirin ?Aggrastat for next 4 hours, then IV heparin ?IV nitroglycerin ?BP control ?CVTS consult for CABG ? ?Recent labs: ?04/30/2021: ?Chol 158, TG 227, HDL 58, LDL 78 ? ?04/12/2021: ?Glucose 119, BUN/Cr 12/0.8. EGFR >60. Na/K 140/4.4 ?H/H 11/33. MCV 90. Platelets 350 ?HbA1C 6.6% ?Chol 327, TG 221, HDL 51, LDL 221 ? ? ? ? ?Review of Systems  ?Cardiovascular:  Positive for chest pain (Chest wall pain from sternotomy recovery) and dyspnea on exertion (Mild, improving). Negative for leg swelling, palpitations and syncope.  ? ?   ? ? ?Vitals:  ? 06/10/21 0944  ?BP: 125/73  ?Pulse: 74  ?Resp: 16  ?Temp: 97.8 ?F (36.6 ?C)  ?SpO2: 95%  ? ? ?Body mass index is 29.05 kg/m?. ?Filed Weights  ?  06/10/21 0944  ?Weight: 164 lb (74.4 kg)  ? ? ? ?Objective:  ? Physical Exam ?Vitals and nursing note reviewed.  ?Constitutional:   ?   General: She is not in acute distress. ?Neck:  ?   Vascular: No JVD.  ?Cardiovascular:  ?   Rate and Rhythm: Normal rate and regular rhythm.  ?   Pulses:     ?     Dorsalis pedis pulses are 0 on the right side and 0 on the left side.  ?     Posterior tibial pulses are 0 on the right side and 1+ on the left side.  ?   Heart sounds: Normal heart sounds. No murmur heard. ?Pulmonary:  ?   Effort: Pulmonary effort is normal.  ?   Breath sounds: Normal breath sounds. No wheezing or rales.  ?Musculoskeletal:  ?   Right lower leg: No edema.  ?   Left lower leg: No edema.  ? ? ? ?  ICD-10-CM   ?1. Precordial pain  R07.2 EKG 12-Lead  ?  PCV ECHOCARDIOGRAM COMPLETE  ?  metoprolol succinate (TOPROL-XL) 50 MG 24 hr tablet  ?  nitroGLYCERIN (NITROSTAT) 0.4 MG SL tablet  ?  CK (Creatine Kinase)  ?  Troponin T  ?  ?2. Coronary artery disease involving native coronary artery of native heart with unstable angina pectoris ([place])  I25.110   ?  ?3. Mixed hyperlipidemia   E78.2   ?  ? ? ? ?   ?Assessment & Recommendations:  ? ?71 y.o. Caucasian female  with hypertension, mixed hyperlipidemia, asthma, remote history of Takotsubo cardiomyopathy, s/p CABGX3 (LIMA-LAD, S

## 2021-06-11 ENCOUNTER — Encounter (HOSPITAL_COMMUNITY): Payer: Medicare Other

## 2021-06-11 LAB — TROPONIN T: Troponin T (Highly Sensitive): 13 ng/L (ref 0–14)

## 2021-06-11 LAB — CK: Total CK: 80 U/L (ref 32–182)

## 2021-06-14 ENCOUNTER — Encounter (HOSPITAL_COMMUNITY): Payer: Medicare Other

## 2021-06-15 ENCOUNTER — Ambulatory Visit: Payer: Medicare Other

## 2021-06-15 DIAGNOSIS — R072 Precordial pain: Secondary | ICD-10-CM

## 2021-06-16 ENCOUNTER — Encounter (HOSPITAL_COMMUNITY): Payer: Medicare Other

## 2021-06-16 ENCOUNTER — Other Ambulatory Visit: Payer: Self-pay

## 2021-06-16 NOTE — Progress Notes (Signed)
Cardiac Individual Treatment Plan ? ?Patient Details  ?Name: Meredith Guerra ?MRN: [id number] ?Date of Birth: Jul 22, 1950 ?Referring Provider:   ?Flowsheet Row CARDIAC REHAB PHASE II ORIENTATION from 05/24/2021 in [facility]  ?Referring Provider Dr. Darcey Nora  ? ?  ? ? ?Initial Encounter Date:  ?Flowsheet Row CARDIAC REHAB PHASE II ORIENTATION from 05/24/2021 in [facility]  ?Date 05/24/21  ? ?  ? ? ?Visit Diagnosis: S/P CABG x 3 ? ?ST elevation myocardial infarction (STEMI), unspecified artery ([place]) ? ?Patient's Home Medications on Admission: ? ?Current Outpatient Medications:  ?  acetaminophen (TYLENOL) 500 MG tablet, Take 1,000 mg by mouth every 6 (six) hours as needed for mild pain, headache or fever., Disp: , Rfl:  ?  albuterol (PROVENTIL) 2 MG tablet, Take 2 mg by mouth 3 (three) times daily as needed for wheezing or shortness of breath., Disp: , Rfl:  ?  albuterol (VENTOLIN HFA) 108 (90 Base) MCG/ACT inhaler, Inhale 2 puffs into the lungs in the morning and at bedtime., Disp: , Rfl:  ?  amiodarone (PACERONE) 200 MG tablet, Take 1 tablet (200 mg total) by mouth daily. TID for 7 days, then BID for 7 days, then once daily, Disp: 90 tablet, Rfl: 1 ?  amLODipine (NORVASC) 2.5 MG tablet, Take 1 tablet (2.5 mg total) by mouth daily., Disp: 60 tablet, Rfl: 3 ?  aspirin 81 MG EC tablet, Take 1 tablet (81 mg total) by mouth daily., Disp: 90 tablet, Rfl: 3 ?  atorvastatin (LIPITOR) 80 MG tablet, Take 1 tablet (80 mg total) by mouth daily., Disp: 90 tablet, Rfl: 3 ?  budesonide-formoterol (SYMBICORT) 160-4.5 MCG/ACT inhaler, Inhale 2 puffs into the lungs 2 (two) times daily., Disp: , Rfl:  ?  butalbital-acetaminophen-caffeine (FIORICET) 50-325-40 MG tablet, Take 1 tablet by mouth 2 (two) times daily as needed for migraine., Disp: , Rfl:  ?  Cholecalciferol (VITAMIN D-3) 25 MCG (1000 UT) CAPS, Take 1,000 Units by mouth daily., Disp: , Rfl:  ?  clopidogrel (PLAVIX) 75 MG tablet, Take 1  tablet (75 mg total) by mouth daily., Disp: 90 tablet, Rfl: 3 ?  DULoxetine (CYMBALTA) 30 MG capsule, Take 30 mg by mouth daily., Disp: , Rfl:  ?  DULoxetine (CYMBALTA) 60 MG capsule, Take 60 mg by mouth at bedtime., Disp: , Rfl:  ?  empagliflozin (JARDIANCE) 10 MG TABS tablet, Take 1 tablet (10 mg total) by mouth daily before breakfast., Disp: 30 tablet, Rfl: 3 ?  ketotifen (ZADITOR) 0.025 % ophthalmic solution, Place 1 drop into both eyes 2 (two) times daily as needed (allergies)., Disp: , Rfl:  ?  levothyroxine (SYNTHROID) 25 MCG tablet, Take 37.5 mcg by mouth daily before breakfast., Disp: , Rfl:  ?  loratadine (CLARITIN) 10 MG tablet, Take 10 mg by mouth daily., Disp: , Rfl:  ?  losartan (COZAAR) 25 MG tablet, Take 0.5 tablets (12.5 mg total) by mouth daily., Disp: 60 tablet, Rfl: 3 ?  metoprolol succinate (TOPROL-XL) 50 MG 24 hr tablet, Take 1 tablet (50 mg total) by mouth daily. Take with or immediately following a meal., Disp: 30 tablet, Rfl: 3 ?  montelukast (SINGULAIR) 10 MG tablet, Take 10 mg by mouth at bedtime., Disp: , Rfl:  ?  nitroGLYCERIN (NITROSTAT) 0.4 MG SL tablet, Place 1 tablet (0.4 mg total) under the tongue every 5 (five) minutes as needed for chest pain., Disp: 30 tablet, Rfl: 1 ?  ondansetron (ZOFRAN) 4 MG tablet, Take 4 mg by mouth every 8 (  eight) hours as needed for nausea or vomiting., Disp: , Rfl:  ?  pantoprazole (PROTONIX) 40 MG tablet, Take 80 mg by mouth daily., Disp: , Rfl:  ?  promethazine (PHENERGAN) 12.5 MG tablet, Take 12.5 mg by mouth every 4 (four) hours as needed for nausea or vomiting., Disp: , Rfl:  ?  Rimegepant Sulfate (NURTEC) 75 MG TBDP, Take 75 mg by mouth daily as needed (migraines)., Disp: , Rfl:  ?  tiZANidine (ZANAFLEX) 4 MG tablet, Take 2-8 mg by mouth See admin instructions. Take 8 mg every night, may take 2 mg during the day as needed for migraines, Disp: , Rfl:  ?  traZODone (DESYREL) 150 MG tablet, Take 150 mg by mouth at bedtime., Disp: , Rfl:  ? ?Past  Medical History: ?Past Medical History:  ?Diagnosis Date  ? Asthma   ? Colitis   ? Hypertension   ? Kidney stones   ? Migraines   ? ? ?Tobacco Use: ?Social History  ? ?Tobacco Use  ?Smoking Status Never  ?Smokeless Tobacco Never  ? ? ?Labs: ?Review Flowsheet   ? ?  ?  Latest Ref Rng & Units 03/30/2021 04/06/2021 04/07/2021 04/30/2021  ?Labs for ITP Cardiac and Pulmonary Rehab  ?Cholestrol 100 - 199 mg/dL 327     158    ?LDL (calc) 0 - 99 mg/dL 232     78    ?HDL-C >39 mg/dL 51     58    ?Trlycerides 0 - 149 mg/dL 221     127    ?Hemoglobin A1c 4.8 - 5.6 % 6.2       ?PH, Arterial 7.350 - 7.450  7.301    ? 7.307    ? 7.385    ? 7.397    ? 7.399    ? 7.413    ? 7.435    ? 7.487    ? 7.519    ? 7.406    ? 7.387      ?PCO2 arterial 32.0 - 48.0 mmHg  47.5    ? 47.3    ? 39.0    ? 39.1    ? 41.7    ? 40.0    ? 38.4    ? 33.9    ? 33.8    ? 42.4    ? 42.2      ?Bicarbonate 20.0 - 28.0 mmol/L  23.7    ? 23.9    ? 23.6    ? 24.1    ? 25.8    ? 25.5    ? 25.8    ? 25.6    ? 26.6    ? 27.6    ? 26.7    ? 24.9      ?TCO2 22 - 32 mmol/L  25    ? 25    ? 25    ? 25    ? 25    ? 27    ? 27    ? 25    ? 27    ? 27    ? 26    ? 28    ? 29    ? 24    ? 28    ? 27      ?Acid-base deficit 0.0 - 2.0 mmol/L  3.0    ? 3.0    ? 2.0    ? 1.0      ?O2 Saturation %  99.0    ? 99.0    ?  100.0    ? 100.0    ? 100.0    ? 100.0    ? 100.0    ? 100.0    ? 82.0    ? 100.0    ? 100.0    ? 94.6   64.1     ?  ? ? Multiple values from one day are sorted in reverse-chronological order  ?  ?  ? ? ?Capillary Blood Glucose: ?Lab Results  ?Component Value Date  ? GLUCAP 94 06/02/2021  ? GLUCAP 120 (H) 05/28/2021  ? GLUCAP 123 (H) 05/26/2021  ? GLUCAP 92 05/24/2021  ? GLUCAP 108 (H) 04/09/2021  ? ? ? ?Exercise Target Goals: ?Exercise Program Goal: ?Individual exercise prescription set using results from initial 6 min walk test and THRR while considering  patient?s activity barriers and safety.  ? ?Exercise Prescription Goal: ?Starting with aerobic activity 30  plus minutes a day, 3 days per week for initial exercise prescription. Provide home exercise prescription and guidelines that participant acknowledges understanding prior to discharge. ? ?Activity Barriers & Risk Stratification: ? Activity Barriers & Cardiac Risk Stratification - 05/24/21 1305   ? ?  ? Activity Barriers & Cardiac Risk Stratification  ? Activity Barriers Joint Problems;Shortness of Breath   ? Cardiac Risk Stratification High   ? ?  ?  ? ?  ? ? ?6 Minute Walk: ? 6 Minute Walk   ? ? [place] Name 05/24/21 1356  ?  ?  ?  ? 6 Minute Walk  ? Phase Initial    ? Distance 1300 feet    ? Walk Time 6 minutes    ? # of Rest Breaks 0    ? MPH 2.46    ? METS 2.73    ? RPE 12    ? VO2 Peak 9.58    ? Symptoms No    ? Resting HR 66 bpm    ? Resting BP 112/72    ? Resting Oxygen Saturation  95 %    ? Exercise Oxygen Saturation  during 6 min walk 96 %    ? Max Ex. HR 89 bpm    ? Max Ex. BP 148/76    ? 2 Minute Post BP 120/72    ? ?  ?  ? ?  ? ? ?Oxygen Initial Assessment: ? ? ?Oxygen Re-Evaluation: ? ? ?Oxygen Discharge (Final Oxygen Re-Evaluation): ? ? ?Initial Exercise Prescription: ? Initial Exercise Prescription - 05/24/21 1300   ? ?  ? Date of Initial Exercise RX and Referring Provider  ? Date 05/24/21   ? Referring Provider Dr. Darcey Nora   ? Expected Discharge Date 08/20/21   ?  ? Treadmill  ? MPH 1.8   ? Grade 0   ? Minutes 17   ?  ? NuStep  ? Level 1   ? SPM 60   ? Minutes 22   ?  ? Prescription Details  ? Frequency (times per week) 3   ? Duration Progress to 30 minutes of continuous aerobic without signs/symptoms of physical distress   ?  ? Intensity  ? THRR 40-80% of Max Heartrate 60-120   ? Ratings of Perceived Exertion 11-13   ?  ? Progression  ? Progression Continue progressive overload as per policy without signs/symptoms or physical distress.   ?  ? Resistance Training  ? Training Prescription Yes   ? Weight 3   ? Reps 10-15   ? ?  ?  ? ?  ? ? ?  Perform Capillary Blood Glucose checks as needed. ? ?Exercise  Prescription Changes: ? ? Exercise Prescription Changes   ? ? [place] Name 05/28/21 1543 06/09/21 1550  ?  ?  ?  ?  ? Response to Exercise  ? Blood Pressure (Admit) 118/68 148/80     ? Blood Pressure (Exercise) 132/72

## 2021-06-18 ENCOUNTER — Encounter (HOSPITAL_COMMUNITY): Payer: Medicare Other

## 2021-06-21 ENCOUNTER — Encounter (HOSPITAL_COMMUNITY): Payer: Medicare Other

## 2021-06-21 ENCOUNTER — Ambulatory Visit: Payer: Self-pay | Admitting: Cardiothoracic Surgery

## 2021-06-23 ENCOUNTER — Encounter (HOSPITAL_COMMUNITY): Payer: Medicare Other

## 2021-06-23 ENCOUNTER — Encounter (HOSPITAL_COMMUNITY)
Admission: RE | Admit: 2021-06-23 | Discharge: 2021-06-23 | Disposition: A | Discharge status: Home / Self Care | Payer: Medicare Other | Source: Ambulatory Visit | Attending: Cardiology | Admitting: Cardiology

## 2021-06-23 DIAGNOSIS — I213 ST elevation (STEMI) myocardial infarction of unspecified site: Secondary | ICD-10-CM | POA: Insufficient documentation

## 2021-06-23 DIAGNOSIS — Z951 Presence of aortocoronary bypass graft: Secondary | ICD-10-CM | POA: Diagnosis present

## 2021-06-23 NOTE — Progress Notes (Signed)
Daily Session Note ? ?Patient Details  ?Name: Meredith Guerra ?MRN: [id number] ?Date of Birth: 1951-01-13 ?Referring Provider:   ?Flowsheet Row CARDIAC REHAB PHASE II ORIENTATION from 05/24/2021 in [facility]  ?Referring Provider Dr. Darcey Nora  ? ?  ? ? ?Encounter Date: 06/23/2021 ? ?Check In: ? Session Check In - 06/23/21 1443   ? ?  ? Check-In  ? Supervising physician immediately available to respond to emergencies [facility] MD immediately available   ? Physician(s) Dr Harl Bowie   ? Location [facility]   ? Staff Present Madelyn Flavors, RN, Bjorn Loser, MS, ACSM-CEP, Exercise Physiologist;Heather Zigmund Daniel, Exercise Physiologist   ? Virtual Visit No   ? Medication changes reported     Yes   ? Comments Tropol increased to 70m once daily by PCP.   ? Fall or balance concerns reported    No   ? Tobacco Cessation No Change   ? Warm-up and Cool-down Performed as group-led instruction   ? Resistance Training Performed Yes   ? VAD Patient? No   ? PAD/SET Patient? No   ?  ? Pain Assessment  ? Currently in Pain? No/denies   ? Multiple Pain Sites No   ? ?  ?  ? ?  ? ? ?Capillary Blood Glucose: ?No results found for this or any previous visit (from the past 24 hour(s)). ? ? ? ?Social History  ? ?Tobacco Use  ?Smoking Status Never  ?Smokeless Tobacco Never  ? ? ?Goals Met:  ?Independence with exercise equipment ?Exercise tolerated well ?No report of concerns or symptoms today ?Strength training completed today ? ?Goals Unmet:  ?Not Applicable ? ?Comments: Check out at 1345. ? ? ?Dr. JCarlyle Dollyis Medical Director for [place]?

## 2021-06-24 ENCOUNTER — Encounter: Payer: Self-pay | Admitting: Cardiothoracic Surgery

## 2021-06-24 ENCOUNTER — Ambulatory Visit (INDEPENDENT_AMBULATORY_CARE_PROVIDER_SITE_OTHER): Payer: Self-pay | Admitting: Cardiothoracic Surgery

## 2021-06-24 VITALS — BP 128/77 | HR 77 | Resp 20 | Ht 63.0 in | Wt 166.0 lb

## 2021-06-24 DIAGNOSIS — Z951 Presence of aortocoronary bypass graft: Secondary | ICD-10-CM

## 2021-06-24 NOTE — Progress Notes (Signed)
?HPI: ?Patient returns for her final postoperative follow-up having undergone multivessel CABG on April 06, 2021 almost 3 months ago.Marland Kitchen ?The patient's early postoperative recovery while in the hospital was notable for no difficulties or setbacks.Marland Kitchen ?Since hospital discharge the patient reports some intermittent chest discomfort which sounds like musculoskeletal.  More recently she has noted significantly improved energy and exercise tolerance doing her normal activities.  Last chest x-ray was clear and her surgical incisions are healing well. ? ? ?Current Outpatient Medications  ?Medication Sig Dispense Refill  ? acetaminophen (TYLENOL) 500 MG tablet Take 1,000 mg by mouth every 6 (six) hours as needed for mild pain, headache or fever.    ? albuterol (PROVENTIL) 2 MG tablet Take 2 mg by mouth 3 (three) times daily as needed for wheezing or shortness of breath.    ? albuterol (VENTOLIN HFA) 108 (90 Base) MCG/ACT inhaler Inhale 2 puffs into the lungs in the morning and at bedtime.    ? amiodarone (PACERONE) 200 MG tablet Take 1 tablet (200 mg total) by mouth daily. TID for 7 days, then BID for 7 days, then once daily 90 tablet 1  ? amLODipine (NORVASC) 2.5 MG tablet Take 1 tablet (2.5 mg total) by mouth daily. 60 tablet 3  ? aspirin 81 MG EC tablet Take 1 tablet (81 mg total) by mouth daily. 90 tablet 3  ? atorvastatin (LIPITOR) 80 MG tablet Take 1 tablet (80 mg total) by mouth daily. 90 tablet 3  ? budesonide-formoterol (SYMBICORT) 160-4.5 MCG/ACT inhaler Inhale 2 puffs into the lungs 2 (two) times daily.    ? butalbital-acetaminophen-caffeine (FIORICET) 50-325-40 MG tablet Take 1 tablet by mouth 2 (two) times daily as needed for migraine.    ? Cholecalciferol (VITAMIN D-3) 25 MCG (1000 UT) CAPS Take 1,000 Units by mouth daily.    ? clopidogrel (PLAVIX) 75 MG tablet Take 1 tablet (75 mg total) by mouth daily. 90 tablet 3  ? DULoxetine (CYMBALTA) 30 MG capsule Take 30 mg by mouth daily.    ? DULoxetine (CYMBALTA) 60  MG capsule Take 60 mg by mouth at bedtime.    ? empagliflozin (JARDIANCE) 10 MG TABS tablet Take 1 tablet (10 mg total) by mouth daily before breakfast. 30 tablet 3  ? ketotifen (ZADITOR) 0.025 % ophthalmic solution Place 1 drop into both eyes 2 (two) times daily as needed (allergies).    ? levothyroxine (SYNTHROID) 25 MCG tablet Take 37.5 mcg by mouth daily before breakfast.    ? loratadine (CLARITIN) 10 MG tablet Take 10 mg by mouth daily.    ? losartan (COZAAR) 25 MG tablet Take 0.5 tablets (12.5 mg total) by mouth daily. 60 tablet 3  ? metoprolol succinate (TOPROL-XL) 50 MG 24 hr tablet Take 1 tablet (50 mg total) by mouth daily. Take with or immediately following a meal. 30 tablet 3  ? montelukast (SINGULAIR) 10 MG tablet Take 10 mg by mouth at bedtime.    ? nitroGLYCERIN (NITROSTAT) 0.4 MG SL tablet Place 1 tablet (0.4 mg total) under the tongue every 5 (five) minutes as needed for chest pain. 30 tablet 1  ? ondansetron (ZOFRAN) 4 MG tablet Take 4 mg by mouth every 8 (eight) hours as needed for nausea or vomiting.    ? pantoprazole (PROTONIX) 40 MG tablet Take 80 mg by mouth daily.    ? promethazine (PHENERGAN) 12.5 MG tablet Take 12.5 mg by mouth every 4 (four) hours as needed for nausea or vomiting.    ? Rimegepant Sulfate (  NURTEC) 75 MG TBDP Take 75 mg by mouth daily as needed (migraines).    ? tiZANidine (ZANAFLEX) 4 MG tablet Take 2-8 mg by mouth See admin instructions. Take 8 mg every night, may take 2 mg during the day as needed for migraines    ? traZODone (DESYREL) 150 MG tablet Take 150 mg by mouth at bedtime.    ? ?No current facility-administered medications for this visit.  ? ? ?Physical Exam: ?General-alert and comfortable peers to be in good health ?Lungs-clear ?Sternal incision well-healed ?Heart rhythm regular murmur or gallop ?Neuro intact ?Extremities without edema ? ?Diagnostic Tests: ?No chest x-ray today.  Chest x-ray last month was clear ? ?Imp  ?Excellent recovery after multivessel  CABG. ?We discussed the importance of maintaining heart healthy diet and heart healthy lifestyle going forward.  She understands the details of both of those issues and all questions were answered regarding any restrictions for upcoming travel plans and activities.  Sternal precautions are lifted after April 17. ? ?Plan: ?Return as needed ? ? ?Dahlia Byes, MD ?[facility] ?[phone] ? ?

## 2021-06-25 ENCOUNTER — Encounter (HOSPITAL_COMMUNITY): Payer: Medicare Other

## 2021-06-28 ENCOUNTER — Encounter (HOSPITAL_COMMUNITY): Payer: Medicare Other

## 2021-06-28 ENCOUNTER — Encounter (HOSPITAL_COMMUNITY)
Admission: RE | Admit: 2021-06-28 | Discharge: 2021-06-28 | Disposition: A | Discharge status: Home / Self Care | Payer: Medicare Other | Source: Ambulatory Visit | Attending: Cardiology | Admitting: Cardiology

## 2021-06-28 VITALS — Wt 166.9 lb

## 2021-06-28 DIAGNOSIS — Z951 Presence of aortocoronary bypass graft: Secondary | ICD-10-CM | POA: Diagnosis not present

## 2021-06-28 DIAGNOSIS — I213 ST elevation (STEMI) myocardial infarction of unspecified site: Secondary | ICD-10-CM

## 2021-06-28 NOTE — Progress Notes (Signed)
Daily Session Note ? ?Patient Details  ?Name: MARVINE ENCALADE ?MRN: [id number] ?Date of Birth: 1950-09-14 ?Referring Provider:   ?Flowsheet Row CARDIAC REHAB PHASE II ORIENTATION from 05/24/2021 in [facility]  ?Referring Provider Dr. Darcey Nora  ? ?  ? ? ?Encounter Date: 06/28/2021 ? ?Check In: ? Session Check In - 06/28/21 1445   ? ?  ? Check-In  ? Supervising physician immediately available to respond to emergencies [facility] MD immediately available   ? Physician(s) Dr Johnsie Cancel   ? Location [facility]   ? Staff Present Geanie Cooley, RN;Heather Otho Ket, [place], Exercise Physiologist;Deyvi Bonanno Hassell Done, RN, Bjorn Loser, MS, ACSM-CEP, Exercise Physiologist   ? Virtual Visit No   ? Medication changes reported     No   ? Fall or balance concerns reported    No   ? Tobacco Cessation No Change   ? Warm-up and Cool-down Performed as group-led instruction   ? Resistance Training Performed Yes   ? VAD Patient? No   ? PAD/SET Patient? No   ?  ? Pain Assessment  ? Currently in Pain? No/denies   ? Multiple Pain Sites No   ? ?  ?  ? ?  ? ? ?Capillary Blood Glucose: ?No results found for this or any previous visit (from the past 24 hour(s)). ? ? ? ?Social History  ? ?Tobacco Use  ?Smoking Status Never  ?Smokeless Tobacco Never  ? ? ?Goals Met:  ?Independence with exercise equipment ?Exercise tolerated well ?No report of concerns or symptoms today ?Strength training completed today ? ?Goals Unmet:  ?Not Applicable ? ?Comments: Checkout at 33. ? ? ?Dr. Carlyle Dolly is Medical Director for [place] ?

## 2021-06-30 ENCOUNTER — Encounter (HOSPITAL_COMMUNITY): Payer: Medicare Other

## 2021-06-30 ENCOUNTER — Encounter: Payer: Self-pay | Admitting: Cardiology

## 2021-06-30 ENCOUNTER — Ambulatory Visit: Payer: Medicare Other | Admitting: Cardiology

## 2021-06-30 VITALS — BP 154/81 | HR 60 | Temp 98.0°F | Resp 16 | Ht 63.0 in | Wt 167.0 lb

## 2021-06-30 DIAGNOSIS — I2510 Atherosclerotic heart disease of native coronary artery without angina pectoris: Secondary | ICD-10-CM

## 2021-06-30 DIAGNOSIS — I10 Essential (primary) hypertension: Secondary | ICD-10-CM

## 2021-06-30 DIAGNOSIS — E782 Mixed hyperlipidemia: Secondary | ICD-10-CM

## 2021-06-30 MED ORDER — METOPROLOL SUCCINATE ER 25 MG PO TB24: 25.0000 mg | ORAL_TABLET | Freq: Every day | ORAL | 3 refills | Status: DC

## 2021-06-30 MED ORDER — AMLODIPINE BESYLATE 5 MG PO TABS: 5.0000 mg | ORAL_TABLET | Freq: Every day | ORAL | 3 refills | Status: DC

## 2021-06-30 NOTE — Progress Notes (Signed)
? ?Follow up visit ? ?Subjective:  ? ?Meredith Guerra, female    DOB: 12-04-1950, 71 y.o.   MRN: [id number] ? ? ? ?HPI ? ?Chief Complaint  ?Patient presents with  ? Precordial pain  ? Follow-up  ?  2-3 week  ? ? ?71 y.o. Caucasian female  with hypertension, mixed hyperlipidemia, asthma, remote history of Takotsubo cardiomyopathy, s/p CABGX3 (LIMA-LAD, SVG-RI-SVG-OM) for STEMI presentation while recovering from [place] (03/2021), brief postop A. fib, now resolved. ? ?Patient has not had any recurrent chest pain. She is back to cardiac rehab and tolerating well. She has noticed increased fatigue symptoms since increasing metoprolol succinate to 50 mg daily. BP elevated today, but usually well controlled. Reviewed recent test results with the patient, details below.  ? ? ? ? ?Current Outpatient Medications:  ?  acetaminophen (TYLENOL) 500 MG tablet, Take 1,000 mg by mouth every 6 (six) hours as needed for mild pain, headache or fever., Disp: , Rfl:  ?  albuterol (PROVENTIL) 2 MG tablet, Take 2 mg by mouth 3 (three) times daily as needed for wheezing or shortness of breath., Disp: , Rfl:  ?  albuterol (VENTOLIN HFA) 108 (90 Base) MCG/ACT inhaler, Inhale 2 puffs into the lungs in the morning and at bedtime., Disp: , Rfl:  ?  amiodarone (PACERONE) 200 MG tablet, Take 1 tablet (200 mg total) by mouth daily. TID for 7 days, then BID for 7 days, then once daily, Disp: 90 tablet, Rfl: 1 ?  amLODipine (NORVASC) 2.5 MG tablet, Take 1 tablet (2.5 mg total) by mouth daily., Disp: 60 tablet, Rfl: 3 ?  aspirin 81 MG EC tablet, Take 1 tablet (81 mg total) by mouth daily., Disp: 90 tablet, Rfl: 3 ?  atorvastatin (LIPITOR) 80 MG tablet, Take 1 tablet (80 mg total) by mouth daily., Disp: 90 tablet, Rfl: 3 ?  budesonide-formoterol (SYMBICORT) 160-4.5 MCG/ACT inhaler, Inhale 2 puffs into the lungs 2 (two) times daily., Disp: , Rfl:  ?  butalbital-acetaminophen-caffeine (FIORICET) 50-325-40 MG tablet, Take 1 tablet by mouth 2 (two) times daily  as needed for migraine., Disp: , Rfl:  ?  Cholecalciferol (VITAMIN D-3) 25 MCG (1000 UT) CAPS, Take 1,000 Units by mouth daily., Disp: , Rfl:  ?  clopidogrel (PLAVIX) 75 MG tablet, Take 1 tablet (75 mg total) by mouth daily., Disp: 90 tablet, Rfl: 3 ?  DULoxetine (CYMBALTA) 30 MG capsule, Take 30 mg by mouth daily., Disp: , Rfl:  ?  DULoxetine (CYMBALTA) 60 MG capsule, Take 60 mg by mouth at bedtime., Disp: , Rfl:  ?  empagliflozin (JARDIANCE) 10 MG TABS tablet, Take 1 tablet (10 mg total) by mouth daily before breakfast., Disp: 30 tablet, Rfl: 3 ?  ketotifen (ZADITOR) 0.025 % ophthalmic solution, Place 1 drop into both eyes 2 (two) times daily as needed (allergies)., Disp: , Rfl:  ?  levothyroxine (SYNTHROID) 25 MCG tablet, Take 37.5 mcg by mouth daily before breakfast., Disp: , Rfl:  ?  loratadine (CLARITIN) 10 MG tablet, Take 10 mg by mouth daily., Disp: , Rfl:  ?  losartan (COZAAR) 25 MG tablet, Take 0.5 tablets (12.5 mg total) by mouth daily., Disp: 60 tablet, Rfl: 3 ?  metoprolol succinate (TOPROL-XL) 50 MG 24 hr tablet, Take 1 tablet (50 mg total) by mouth daily. Take with or immediately following a meal., Disp: 30 tablet, Rfl: 3 ?  montelukast (SINGULAIR) 10 MG tablet, Take 10 mg by mouth at bedtime., Disp: , Rfl:  ?  nitroGLYCERIN (NITROSTAT) 0.4 MG  SL tablet, Place 1 tablet (0.4 mg total) under the tongue every 5 (five) minutes as needed for chest pain., Disp: 30 tablet, Rfl: 1 ?  ondansetron (ZOFRAN) 4 MG tablet, Take 4 mg by mouth every 8 (eight) hours as needed for nausea or vomiting., Disp: , Rfl:  ?  pantoprazole (PROTONIX) 40 MG tablet, Take 80 mg by mouth daily., Disp: , Rfl:  ?  promethazine (PHENERGAN) 12.5 MG tablet, Take 12.5 mg by mouth every 4 (four) hours as needed for nausea or vomiting., Disp: , Rfl:  ?  Rimegepant Sulfate (NURTEC) 75 MG TBDP, Take 75 mg by mouth daily as needed (migraines)., Disp: , Rfl:  ?  tiZANidine (ZANAFLEX) 4 MG tablet, Take 2-8 mg by mouth See admin instructions.  Take 8 mg every night, may take 2 mg during the day as needed for migraines, Disp: , Rfl:  ?  traZODone (DESYREL) 150 MG tablet, Take 150 mg by mouth at bedtime., Disp: , Rfl:  ? ? ? ?Cardiovascular & other pertient studies: ? ?Echocardiogram 06/15/2021:  ?Left ventricle cavity is normal in size and wall thickness. Abnormal  ?septal wall motion due to post-operative coronary artery bypass graft.  ?Normal LV systolic function with EF 56%. Doppler evidence of grade I  ?(impaired) diastolic dysfunction, normal LAP.  ?Mild tricuspid regurgitation.  ?No evidence of pulmonary hypertension.  ?Compared to previous study on 03/30/2021, wall motion abnormalities have  ?resolved. LVEF has improved from 30-35%. ? ?EKG 06/10/2021: ?Sinus rhythm 67 bpm  ?Old anteroseptal infarct ?Diffuse nonspecific T-abnormality ? ?Op note 04/06/2021 (Dr Prescott Gum): ?1.  Coronary artery bypass grafting x3 (left internal mammary artery to LAD, saphenous vein graft to ramus intermedius, saphenous vein graft to circumflex marginal).  ?2.  Endoscopic harvest of right leg greater saphenous vein. ? ?Cardiac MRI 03/31/2021 ?1.  No late gadolinium enhancement, suggesting myocardium is viable ?2. Normal LV size with mild systolic dysfunction (EF 93%). Apical ?hypokinesis ?3.  Small RV size with normal systolic function (EF 81%) ? ?Coronary angiogram 03/29/2021: ?LM: 90% distal stenosis at the trifurcation ?LAD: Proximal focal 80% stenosis at bifurcation with large septal perforator branch, focal mid 60% stenosis ?Ramus intermedius: No significant disease ?Left circumflex: Mid 30% stenosis ?RCA: Moderate diffuse disease in small caliber RPDA ?  ?Aspirin ?Aggrastat for next 4 hours, then IV heparin ?IV nitroglycerin ?BP control ?CVTS consult for CABG ? ?Recent labs: ?04/30/2021: ?Chol 158, TG 227, HDL 58, LDL 78 ? ?04/12/2021: ?Glucose 119, BUN/Cr 12/0.8. EGFR >60. Na/K 140/4.4 ?H/H 11/33. MCV 90. Platelets 350 ?HbA1C 6.6% ?Chol 327, TG 221, HDL 51, LDL  221 ? ? ? ? ?Review of Systems  ?Cardiovascular:  Negative for chest pain, dyspnea on exertion, leg swelling, palpitations and syncope.  ? ?   ? ? ?Vitals:  ? 06/30/21 1428  ?BP: (!) 154/81  ?Pulse: 60  ?Resp: 16  ?Temp: 98 ?F (36.7 ?C)  ?SpO2: 97%  ? ? ?Body mass index is 29.58 kg/m?. ?Filed Weights  ? 06/30/21 1428  ?Weight: 167 lb (75.8 kg)  ? ? ? ?Objective:  ? Physical Exam ?Vitals and nursing note reviewed.  ?Constitutional:   ?   General: She is not in acute distress. ?Neck:  ?   Vascular: No JVD.  ?Cardiovascular:  ?   Rate and Rhythm: Normal rate and regular rhythm.  ?   Pulses:     ?     Dorsalis pedis pulses are 0 on the right side and 0 on the left side.  ?  Posterior tibial pulses are 0 on the right side and 1+ on the left side.  ?   Heart sounds: Normal heart sounds. No murmur heard. ?Pulmonary:  ?   Effort: Pulmonary effort is normal.  ?   Breath sounds: Normal breath sounds. No wheezing or rales.  ?Musculoskeletal:  ?   Right lower leg: No edema.  ?   Left lower leg: No edema.  ? ? ? ?  ICD-10-CM   ?1. Coronary artery disease involving native coronary artery of native heart without angina pectoris  I25.10 clonazePAM (KLONOPIN) 0.5 MG tablet  ?  amLODipine (NORVASC) 5 MG tablet  ?  metoprolol succinate (TOPROL-XL) 25 MG 24 hr tablet  ?  ?2. Mixed hyperlipidemia  E78.2   ?  ?3. Primary hypertension  I10   ?  ? ? ? ?   ?Assessment & Recommendations:  ? ?71 y.o. Caucasian female  with hypertension, mixed hyperlipidemia, asthma, remote history of Takotsubo cardiomyopathy, s/p CABGX3 (LIMA-LAD, SVG-RI, SVG-OM) for STEMI presentation while recovering from [place] (03/2021), brief postop A. fib, now resolved. ? ?Coronary artery disease: ?S/p CABGX3 (LIMA-LAD, SVG-RI-SVG-OM) (03/2021) ?No recurrent chest pain ?EF has normalized ? ?Continue aspirin and Plavix for 1 year given ACS presentation prior to CABG. ?Continue Lipitor 80 mg daily.  LDL is down from 232 to 78.  ?Decrease metoprolol succinate from 50 mg to  25 mg daily given fatigue. ?Increase amlodipine to 5 mg daily. ?Continue losartan 12.5 mg daily, Jardiance 10 mg daily.  ? ?Brief post op Afib: ?No recurrence. ?Off amiodarone. ?Hold off anticoagulation as recurr

## 2021-07-02 ENCOUNTER — Encounter (HOSPITAL_COMMUNITY): Payer: Medicare Other

## 2021-07-02 ENCOUNTER — Encounter (HOSPITAL_COMMUNITY)
Admission: RE | Admit: 2021-07-02 | Discharge: 2021-07-02 | Disposition: A | Discharge status: Home / Self Care | Payer: Medicare Other | Source: Ambulatory Visit | Attending: Cardiology | Admitting: Cardiology

## 2021-07-02 DIAGNOSIS — I213 ST elevation (STEMI) myocardial infarction of unspecified site: Secondary | ICD-10-CM

## 2021-07-02 DIAGNOSIS — Z951 Presence of aortocoronary bypass graft: Secondary | ICD-10-CM | POA: Diagnosis not present

## 2021-07-02 NOTE — Progress Notes (Signed)
Daily Session Note ? ?Patient Details  ?Name: Meredith Guerra ?MRN: [id number] ?Date of Birth: [birth year] ?Referring Provider:   ?Flowsheet Row CARDIAC REHAB PHASE II ORIENTATION from 05/24/2021 in [place]  ?Referring Provider Dr. Darcey Nora  ? ?  ? ? ?Encounter Date: 07/02/2021 ? ?Check In: ? Session Check In - 07/02/21 1445   ? ?  ? Check-In  ? Supervising physician immediately available to respond to emergencies [facility] MD immediately available   ? Physician(s) Dr Johnsie Cancel   ? Location [facility]   ? Staff Present Redge Gainer, BS, Exercise Physiologist;Dalton Kris Mouton, MS, ACSM-CEP, Exercise Physiologist;Betzabe Bevans Wynetta Emery, RN, BSN   ? Virtual Visit No   ? Medication changes reported     No   ? Fall or balance concerns reported    No   ? Tobacco Cessation No Change   ? Warm-up and Cool-down Performed as group-led instruction   ? Resistance Training Performed Yes   ? VAD Patient? No   ? PAD/SET Patient? No   ?  ? Pain Assessment  ? Currently in Pain? No/denies   ? Multiple Pain Sites No   ? ?  ?  ? ?  ? ? ?Capillary Blood Glucose: ?No results found for this or any previous visit (from the past 24 hour(s)). ? ? ? ?Social History  ? ?Tobacco Use  ?Smoking Status Never  ?Smokeless Tobacco Never  ? ? ?Goals Met:  ?Independence with exercise equipment ?Exercise tolerated well ?No report of concerns or symptoms today ?Strength training completed today ? ?Goals Unmet:  ?Not Applicable ? ?Comments: Check out 1545. ? ? ?Dr. Carlyle Dolly is Medical Director for [place] ?

## 2021-07-05 ENCOUNTER — Encounter (HOSPITAL_COMMUNITY): Payer: Medicare Other

## 2021-07-07 ENCOUNTER — Encounter (HOSPITAL_COMMUNITY): Payer: Medicare Other

## 2021-07-08 ENCOUNTER — Other Ambulatory Visit: Payer: Self-pay | Admitting: Physician Assistant

## 2021-07-09 ENCOUNTER — Encounter (HOSPITAL_COMMUNITY): Payer: Medicare Other

## 2021-07-12 ENCOUNTER — Encounter (HOSPITAL_COMMUNITY)
Admission: RE | Admit: 2021-07-12 | Discharge: 2021-07-12 | Disposition: A | Discharge status: Home / Self Care | Payer: Medicare Other | Source: Ambulatory Visit | Attending: Cardiology | Admitting: Cardiology

## 2021-07-12 ENCOUNTER — Encounter (HOSPITAL_COMMUNITY): Payer: Medicare Other

## 2021-07-12 VITALS — Wt 165.6 lb

## 2021-07-12 DIAGNOSIS — Z951 Presence of aortocoronary bypass graft: Secondary | ICD-10-CM | POA: Diagnosis not present

## 2021-07-12 DIAGNOSIS — I213 ST elevation (STEMI) myocardial infarction of unspecified site: Secondary | ICD-10-CM

## 2021-07-12 NOTE — Progress Notes (Signed)
Daily Session Note ? ?Patient Details  ?Name: Meredith Guerra ?MRN: [id number] ?Date of Birth: 1950-06-18 ?Referring Provider:   ?Flowsheet Row CARDIAC REHAB PHASE II ORIENTATION from 05/24/2021 in [place]  ?Referring Provider Dr. Darcey Nora  ? ?  ? ? ?Encounter Date: 07/12/2021 ? ?Check In: ? Session Check In - 07/12/21 1443   ? ?  ? Check-In  ? Supervising physician immediately available to respond to emergencies [facility] MD immediately available   ? Physician(s) Dr Gasper Sells   ? Location [facility]   ? Staff Present Madelyn Flavors, RN, Bjorn Loser, MS, ACSM-CEP, Exercise Physiologist;Heather Zigmund Daniel, Exercise Physiologist   ? Virtual Visit No   ? Medication changes reported     No   ? Comments Tropol increased to 10m once daily by PCP.   ? Tobacco Cessation No Change   ? Warm-up and Cool-down Performed as group-led instruction   ? Resistance Training Performed Yes   ? VAD Patient? No   ? PAD/SET Patient? No   ?  ? Pain Assessment  ? Currently in Pain? No/denies   ? Multiple Pain Sites No   ? ?  ?  ? ?  ? ? ?Capillary Blood Glucose: ?No results found for this or any previous visit (from the past 24 hour(s)). ? ? ? ?Social History  ? ?Tobacco Use  ?Smoking Status Never  ?Smokeless Tobacco Never  ? ? ?Goals Met:  ?Independence with exercise equipment ?Exercise tolerated well ?No report of concerns or symptoms today ?Strength training completed today ? ?Goals Unmet:  ?Not Applicable ? ?Comments: Check out 1545. ? ? ?Dr. JCarlyle Dollyis Medical Director for [place]?

## 2021-07-14 ENCOUNTER — Encounter (HOSPITAL_COMMUNITY): Payer: Medicare Other

## 2021-07-14 ENCOUNTER — Encounter (HOSPITAL_COMMUNITY)
Admission: RE | Admit: 2021-07-14 | Discharge: 2021-07-14 | Disposition: A | Discharge status: Home / Self Care | Payer: Medicare Other | Source: Ambulatory Visit | Attending: Cardiology | Admitting: Cardiology

## 2021-07-14 DIAGNOSIS — I213 ST elevation (STEMI) myocardial infarction of unspecified site: Secondary | ICD-10-CM

## 2021-07-14 DIAGNOSIS — Z951 Presence of aortocoronary bypass graft: Secondary | ICD-10-CM | POA: Diagnosis not present

## 2021-07-14 NOTE — Progress Notes (Signed)
Cardiac Individual Treatment Plan ? ?Patient Details  ?Name: Meredith Guerra ?MRN: [id number] ?Date of Birth: 01-24-1951 ?Referring Provider:   ?Flowsheet Row CARDIAC REHAB PHASE II ORIENTATION from 05/24/2021 in [place] CARDIAC REHABILITATION  ?Referring Provider Dr. Maren Beach  ? ?  ? ? ?Initial Encounter Date:  ?Flowsheet Row CARDIAC REHAB PHASE II ORIENTATION from 05/24/2021 in [place] CARDIAC REHABILITATION  ?Date 05/24/21  ? ?  ? ? ?Visit Diagnosis: S/P CABG x 3 ? ?ST elevation myocardial infarction (STEMI), unspecified artery (HCC) ? ?Patient's Home Medications on Admission: ? ?Current Outpatient Medications:  ?  acetaminophen (TYLENOL) 500 MG tablet, Take 1,000 mg by mouth every 6 (six) hours as needed for mild pain, headache or fever., Disp: , Rfl:  ?  albuterol (PROVENTIL) 2 MG tablet, Take 2 mg by mouth 3 (three) times daily as needed for wheezing or shortness of breath., Disp: , Rfl:  ?  albuterol (VENTOLIN HFA) 108 (90 Base) MCG/ACT inhaler, Inhale 2 puffs into the lungs in the morning and at bedtime., Disp: , Rfl:  ?  amiodarone (PACERONE) 200 MG tablet, Take 1 tablet (200 mg total) by mouth daily. TID for 7 days, then BID for 7 days, then once daily, Disp: 90 tablet, Rfl: 1 ?  amLODipine (NORVASC) 5 MG tablet, Take 1 tablet (5 mg total) by mouth daily., Disp: 90 tablet, Rfl: 3 ?  aspirin 81 MG EC tablet, Take 1 tablet (81 mg total) by mouth daily., Disp: 90 tablet, Rfl: 3 ?  atorvastatin (LIPITOR) 80 MG tablet, Take 1 tablet (80 mg total) by mouth daily., Disp: 90 tablet, Rfl: 3 ?  budesonide-formoterol (SYMBICORT) 160-4.5 MCG/ACT inhaler, Inhale 2 puffs into the lungs 2 (two) times daily., Disp: , Rfl:  ?  butalbital-acetaminophen-caffeine (FIORICET) 50-325-40 MG tablet, Take 1 tablet by mouth 2 (two) times daily as needed for migraine., Disp: , Rfl:  ?  Cholecalciferol (VITAMIN D-3) 25 MCG (1000 UT) CAPS, Take 1,000 Units by mouth daily., Disp: , Rfl:  ?  clonazePAM (KLONOPIN) 0.5 MG tablet, Take 0.5 mg  by mouth daily as needed., Disp: , Rfl:  ?  clopidogrel (PLAVIX) 75 MG tablet, Take 1 tablet (75 mg total) by mouth daily., Disp: 90 tablet, Rfl: 3 ?  DULoxetine (CYMBALTA) 30 MG capsule, Take 30 mg by mouth daily., Disp: , Rfl:  ?  DULoxetine (CYMBALTA) 60 MG capsule, Take 60 mg by mouth at bedtime., Disp: , Rfl:  ?  empagliflozin (JARDIANCE) 10 MG TABS tablet, Take 1 tablet (10 mg total) by mouth daily before breakfast., Disp: 30 tablet, Rfl: 3 ?  ketotifen (ZADITOR) 0.025 % ophthalmic solution, Place 1 drop into both eyes 2 (two) times daily as needed (allergies)., Disp: , Rfl:  ?  levothyroxine (SYNTHROID) 25 MCG tablet, Take 37.5 mcg by mouth daily before breakfast., Disp: , Rfl:  ?  loratadine (CLARITIN) 10 MG tablet, Take 10 mg by mouth daily., Disp: , Rfl:  ?  losartan (COZAAR) 25 MG tablet, Take 0.5 tablets (12.5 mg total) by mouth daily., Disp: 60 tablet, Rfl: 3 ?  metoprolol succinate (TOPROL-XL) 25 MG 24 hr tablet, Take 1 tablet (25 mg total) by mouth daily. Take with or immediately following a meal., Disp: 90 tablet, Rfl: 3 ?  montelukast (SINGULAIR) 10 MG tablet, Take 10 mg by mouth at bedtime., Disp: , Rfl:  ?  nitroGLYCERIN (NITROSTAT) 0.4 MG SL tablet, Place 1 tablet (0.4 mg total) under the tongue every 5 (five) minutes as needed for chest pain.,  Disp: 30 tablet, Rfl: 1 ?  ondansetron (ZOFRAN) 4 MG tablet, Take 4 mg by mouth every 8 (eight) hours as needed for nausea or vomiting., Disp: , Rfl:  ?  pantoprazole (PROTONIX) 40 MG tablet, Take 80 mg by mouth daily., Disp: , Rfl:  ?  promethazine (PHENERGAN) 12.5 MG tablet, Take 12.5 mg by mouth every 4 (four) hours as needed for nausea or vomiting., Disp: , Rfl:  ?  Rimegepant Sulfate (NURTEC) 75 MG TBDP, Take 75 mg by mouth daily as needed (migraines)., Disp: , Rfl:  ?  tiZANidine (ZANAFLEX) 4 MG tablet, Take 2-8 mg by mouth See admin instructions. Take 8 mg every night, may take 2 mg during the day as needed for migraines, Disp: , Rfl:  ?  traZODone  (DESYREL) 150 MG tablet, Take 150 mg by mouth at bedtime., Disp: , Rfl:  ? ?Past Medical History: ?Past Medical History:  ?Diagnosis Date  ? Asthma   ? Colitis   ? Hypertension   ? Kidney stones   ? Migraines   ? ? ?Tobacco Use: ?Social History  ? ?Tobacco Use  ?Smoking Status Never  ?Smokeless Tobacco Never  ? ? ?Labs: ?Review Flowsheet   ? ?  ?  Latest Ref Rng & Units 03/30/2021 04/06/2021 04/07/2021 04/30/2021  ?Labs for ITP Cardiac and Pulmonary Rehab  ?Cholestrol 100 - 199 mg/dL 130     865    ?LDL (calc) 0 - 99 mg/dL 784     78    ?HDL-C >39 mg/dL 51     58    ?Trlycerides 0 - 149 mg/dL 696     295    ?Hemoglobin A1c 4.8 - 5.6 % 6.2       ?PH, Arterial 7.350 - 7.450  7.301    ? 7.307    ? 7.385    ? 7.397    ? 7.399    ? 7.413    ? 7.435    ? 7.487    ? 7.519    ? 7.406    ? 7.387      ?PCO2 arterial 32.0 - 48.0 mmHg  47.5    ? 47.3    ? 39.0    ? 39.1    ? 41.7    ? 40.0    ? 38.4    ? 33.9    ? 33.8    ? 42.4    ? 42.2      ?Bicarbonate 20.0 - 28.0 mmol/L  23.7    ? 23.9    ? 23.6    ? 24.1    ? 25.8    ? 25.5    ? 25.8    ? 25.6    ? 26.6    ? 27.6    ? 26.7    ? 24.9      ?TCO2 22 - 32 mmol/L  25    ? 25    ? 25    ? 25    ? 25    ? 27    ? 27    ? 25    ? 27    ? 27    ? 26    ? 28    ? 29    ? 24    ? 28    ? 27      ?Acid-base deficit 0.0 - 2.0 mmol/L  3.0    ? 3.0    ? 2.0    ? 1.0      ?  O2 Saturation %  99.0    ? 99.0    ? 100.0    ? 100.0    ? 100.0    ? 100.0    ? 100.0    ? 100.0    ? 82.0    ? 100.0    ? 100.0    ? 94.6   64.1     ?  ? ? Multiple values from one day are sorted in reverse-chronological order  ?  ?  ? ? ?Capillary Blood Glucose: ?Lab Results  ?Component Value Date  ? GLUCAP 94 06/02/2021  ? GLUCAP 120 (H) 05/28/2021  ? GLUCAP 123 (H) 05/26/2021  ? GLUCAP 92 05/24/2021  ? GLUCAP 108 (H) 04/09/2021  ? ? ? ?Exercise Target Goals: ?Exercise Program Goal: ?Individual exercise prescription set using results from initial 6 min walk test and THRR while considering  patient?s activity barriers  and safety.  ? ?Exercise Prescription Goal: ?Starting with aerobic activity 30 plus minutes a day, 3 days per week for initial exercise prescription. Provide home exercise prescription and guidelines that participant acknowledges understanding prior to discharge. ? ?Activity Barriers & Risk Stratification: ? Activity Barriers & Cardiac Risk Stratification - 05/24/21 1305   ? ?  ? Activity Barriers & Cardiac Risk Stratification  ? Activity Barriers Joint Problems;Shortness of Breath   ? Cardiac Risk Stratification High   ? ?  ?  ? ?  ? ? ?6 Minute Walk: ? 6 Minute Walk   ? ? Row Name 05/24/21 1356  ?  ?  ?  ? 6 Minute Walk  ? Phase Initial    ? Distance 1300 feet    ? Walk Time 6 minutes    ? # of Rest Breaks 0    ? MPH 2.46    ? METS 2.73    ? RPE 12    ? VO2 Peak 9.58    ? Symptoms No    ? Resting HR 66 bpm    ? Resting BP 112/72    ? Resting Oxygen Saturation  95 %    ? Exercise Oxygen Saturation  during 6 min walk 96 %    ? Max Ex. HR 89 bpm    ? Max Ex. BP 148/76    ? 2 Minute Post BP 120/72    ? ?  ?  ? ?  ? ? ?Oxygen Initial Assessment: ? ? ?Oxygen Re-Evaluation: ? ? ?Oxygen Discharge (Final Oxygen Re-Evaluation): ? ? ?Initial Exercise Prescription: ? Initial Exercise Prescription - 05/24/21 1300   ? ?  ? Date of Initial Exercise RX and Referring Provider  ? Date 05/24/21   ? Referring Provider Dr. Maren Beach   ? Expected Discharge Date 08/20/21   ?  ? Treadmill  ? MPH 1.8   ? Grade 0   ? Minutes 17   ?  ? NuStep  ? Level 1   ? SPM 60   ? Minutes 22   ?  ? Prescription Details  ? Frequency (times per week) 3   ? Duration Progress to 30 minutes of continuous aerobic without signs/symptoms of physical distress   ?  ? Intensity  ? THRR 40-80% of Max Heartrate 60-120   ? Ratings of Perceived Exertion 11-13   ?  ? Progression  ? Progression Continue progressive overload as per policy without signs/symptoms or physical distress.   ?  ? Resistance Training  ? Training Prescription Yes   ? Weight 3   ?  Reps 10-15   ? ?   ?  ? ?  ? ? ?Perform Capillary Blood Glucose checks as needed. ? ?Exercise Prescription Changes: ? ? Exercise Prescription Changes   ? ? Row Name 05/28/21 1543 06/09/21 1550 06/28/21 1500 07/12/21 15

## 2021-07-14 NOTE — Progress Notes (Signed)
Daily Session Note ? ?Patient Details  ?Name: Meredith Guerra ?MRN: [id number] ?Date of Birth: 06/30/1950 ?Referring Provider:   ?Flowsheet Row CARDIAC REHAB PHASE II ORIENTATION from 05/24/2021 in [place]  ?Referring Provider Dr. Darcey Nora  ? ?  ? ? ?Encounter Date: 07/14/2021 ? ?Check In: ? Session Check In - 07/14/21 1441   ? ?  ? Check-In  ? Supervising physician immediately available to respond to emergencies [facility] MD immediately available   ? Physician(s) Dr Domenic Polite   ? Location [facility]   ? Staff Present Aundra Dubin, RN, Joanette Gula, RN, BSN;Heather Otho Ket, BS, Exercise Physiologist   ? Virtual Visit No   ? Medication changes reported     No   ? Comments Tropol increased to 28m once daily by PCP.   ? Fall or balance concerns reported    No   ? Tobacco Cessation No Change   ? Warm-up and Cool-down Performed as group-led instruction   ? Resistance Training Performed Yes   ? VAD Patient? No   ? PAD/SET Patient? No   ?  ? Pain Assessment  ? Currently in Pain? No/denies   ? Multiple Pain Sites No   ? ?  ?  ? ?  ? ? ?Capillary Blood Glucose: ?No results found for this or any previous visit (from the past 24 hour(s)). ? ? ? ?Social History  ? ?Tobacco Use  ?Smoking Status Never  ?Smokeless Tobacco Never  ? ? ?Goals Met:  ?Independence with exercise equipment ?Exercise tolerated well ?No report of concerns or symptoms today ?Strength training completed today ? ?Goals Unmet:  ?Not Applicable ? ?Comments: Check out 1545. ? ? ?Dr. JCarlyle Dollyis Medical Director for [place]?

## 2021-07-16 ENCOUNTER — Encounter (HOSPITAL_COMMUNITY): Payer: Medicare Other

## 2021-07-16 ENCOUNTER — Encounter (HOSPITAL_COMMUNITY)
Admission: RE | Admit: 2021-07-16 | Discharge: 2021-07-16 | Disposition: A | Discharge status: Home / Self Care | Payer: Medicare Other | Source: Ambulatory Visit | Attending: Cardiology | Admitting: Cardiology

## 2021-07-16 DIAGNOSIS — Z951 Presence of aortocoronary bypass graft: Secondary | ICD-10-CM | POA: Diagnosis not present

## 2021-07-16 DIAGNOSIS — I213 ST elevation (STEMI) myocardial infarction of unspecified site: Secondary | ICD-10-CM

## 2021-07-16 NOTE — Progress Notes (Signed)
Daily Session Note ? ?Patient Details  ?Name: Meredith Guerra ?MRN: [id number] ?Date of Birth: [birth year] ?Referring Provider:   ?Flowsheet Row CARDIAC REHAB PHASE II ORIENTATION from 05/24/2021 in [place]  ?Referring Provider Dr. Darcey Guerra  ? ?  ? ? ?Encounter Date: 07/16/2021 ? ?Check In: ? Session Check In - 07/16/21 1440   ? ?  ? Check-In  ? Supervising physician immediately available to respond to emergencies [facility] MD immediately available   ? Physician(s) Dr Meredith Guerra   ? Location [facility]   ? Staff Present Meredith Flavors, RN, Meredith Loser, MS, ACSM-CEP, Exercise Physiologist;Meredith Guerra, Exercise Physiologist   ? Virtual Visit No   ? Medication changes reported     No   ? Comments Tropol increased to 21m once daily by PCP.   ? Fall or balance concerns reported    No   ? Tobacco Cessation No Change   ? Warm-up and Cool-down Performed as group-led instruction   ? Resistance Training Performed Yes   ? VAD Patient? No   ? PAD/SET Patient? No   ?  ? Pain Assessment  ? Currently in Pain? No/denies   ? Multiple Pain Sites No   ? ?  ?  ? ?  ? ? ?Capillary Blood Glucose: ?No results found for this or any previous visit (from the past 24 hour(s)). ? ? ? ?Social History  ? ?Tobacco Use  ?Smoking Status Never  ?Smokeless Tobacco Never  ? ? ?Goals Met:  ?Independence with exercise equipment ?Exercise tolerated well ?No report of concerns or symptoms today ?Strength training completed today ? ?Goals Unmet:  ?Not Applicable ? ?Comments: Check out 1545. ? ? ?Dr. JCarlyle Dollyis Medical Director for Meredith Guerra?

## 2021-07-19 ENCOUNTER — Encounter (HOSPITAL_COMMUNITY): Payer: Medicare Other

## 2021-07-19 ENCOUNTER — Encounter (HOSPITAL_COMMUNITY)
Admission: RE | Admit: 2021-07-19 | Discharge: 2021-07-19 | Disposition: A | Discharge status: Home / Self Care | Payer: Medicare Other | Source: Ambulatory Visit | Attending: Cardiology | Admitting: Cardiology

## 2021-07-19 DIAGNOSIS — Z951 Presence of aortocoronary bypass graft: Secondary | ICD-10-CM | POA: Diagnosis present

## 2021-07-19 DIAGNOSIS — I213 ST elevation (STEMI) myocardial infarction of unspecified site: Secondary | ICD-10-CM | POA: Diagnosis present

## 2021-07-19 NOTE — Progress Notes (Signed)
Daily Session Note ? ?Patient Details  ?Name: Meredith Guerra ?MRN: [id number] ?Date of Birth: 1950/12/22 ?Referring Provider:   ?Flowsheet Row CARDIAC REHAB PHASE II ORIENTATION from 05/24/2021 in [place]  ?Referring Provider Dr. Darcey Nora  ? ?  ? ? ?Encounter Date: 07/19/2021 ? ?Check In: ? Session Check In - 07/19/21 1438   ? ?  ? Check-In  ? Supervising physician immediately available to respond to emergencies [facility] MD immediately available   ? Physician(s) Dr Harl Bowie   ? Location [facility]   ? Staff Present Aundra Dubin, RN, Joanette Gula, RN, Bjorn Loser, MS, ACSM-CEP, Exercise Physiologist;Heather Zigmund Daniel, Exercise Physiologist   ? Virtual Visit No   ? Medication changes reported     No   ? Comments Tropol increased to 60m once daily by PCP.   ? Fall or balance concerns reported    No   ? Warm-up and Cool-down Performed as group-led instruction   ? Resistance Training Performed Yes   ? VAD Patient? No   ? PAD/SET Patient? No   ?  ? Pain Assessment  ? Currently in Pain? No/denies   ? Multiple Pain Sites No   ? ?  ?  ? ?  ? ? ?Capillary Blood Glucose: ?No results found for this or any previous visit (from the past 24 hour(s)). ? ? ? ?Social History  ? ?Tobacco Use  ?Smoking Status Never  ?Smokeless Tobacco Never  ? ? ?Goals Met:  ?Independence with exercise equipment ?Exercise tolerated well ?No report of concerns or symptoms today ?Strength training completed today ? ?Goals Unmet:  ?Not Applicable ? ?Comments: check out at 1545. ? ? ?Dr. JCarlyle Dollyis Medical Director for [place]?

## 2021-07-21 ENCOUNTER — Encounter (HOSPITAL_COMMUNITY): Payer: Medicare Other

## 2021-07-22 ENCOUNTER — Ambulatory Visit: Payer: Medicare Other | Admitting: Cardiology

## 2021-07-23 ENCOUNTER — Encounter (HOSPITAL_COMMUNITY): Payer: Medicare Other

## 2021-07-23 ENCOUNTER — Encounter (HOSPITAL_COMMUNITY)
Admission: RE | Admit: 2021-07-23 | Discharge: 2021-07-23 | Disposition: A | Discharge status: Home / Self Care | Payer: Medicare Other | Source: Ambulatory Visit | Attending: Cardiology | Admitting: Cardiology

## 2021-07-23 DIAGNOSIS — I213 ST elevation (STEMI) myocardial infarction of unspecified site: Secondary | ICD-10-CM

## 2021-07-23 DIAGNOSIS — Z951 Presence of aortocoronary bypass graft: Secondary | ICD-10-CM | POA: Diagnosis not present

## 2021-07-23 NOTE — Progress Notes (Signed)
Daily Session Note ? ?Patient Details  ?Name: Meredith Guerra ?MRN: [id number] ?Date of Birth: 1950/06/10 ?Referring Provider:   ?Flowsheet Row CARDIAC REHAB PHASE II ORIENTATION from 05/24/2021 in [place]  ?Referring Provider Dr. Darcey Nora  ? ?  ? ? ?Encounter Date: 07/23/2021 ? ?Check In: ? Session Check In - 07/23/21 1445   ? ?  ? Check-In  ? Supervising physician immediately available to respond to emergencies [facility] MD immediately available   ? Physician(s) Dr. Radford Pax   ? Location [facility]   ? Staff Present Geanie Cooley, RN;Heather Otho Ket, BS, Exercise Physiologist;Dalton Fletcher, MS, ACSM-CEP, Exercise Physiologist   ? Virtual Visit No   ? Medication changes reported     No   ? Fall or balance concerns reported    No   ? Tobacco Cessation No Change   ? Warm-up and Cool-down Performed as group-led instruction   ? Resistance Training Performed Yes   ? VAD Patient? No   ? PAD/SET Patient? No   ?  ? Pain Assessment  ? Currently in Pain? No/denies   ? Multiple Pain Sites No   ? ?  ?  ? ?  ? ? ?Capillary Blood Glucose: ?No results found for this or any previous visit (from the past 24 hour(s)). ? ? ? ?Social History  ? ?Tobacco Use  ?Smoking Status Never  ?Smokeless Tobacco Never  ? ? ?Goals Met:  ?Independence with exercise equipment ?Exercise tolerated well ?No report of concerns or symptoms today ?Strength training completed today ? ?Goals Unmet:  ?Not Applicable ? ?Comments: check out@ 3:45pm ? ? ?Dr. Carlyle Dolly is Medical Director for [place] ?

## 2021-07-26 ENCOUNTER — Encounter (HOSPITAL_COMMUNITY)
Admission: RE | Admit: 2021-07-26 | Discharge: 2021-07-26 | Disposition: A | Discharge status: Home / Self Care | Payer: Medicare Other | Source: Ambulatory Visit | Attending: Cardiology | Admitting: Cardiology

## 2021-07-26 ENCOUNTER — Encounter (HOSPITAL_COMMUNITY): Payer: Medicare Other

## 2021-07-26 VITALS — Wt 165.8 lb

## 2021-07-26 DIAGNOSIS — I213 ST elevation (STEMI) myocardial infarction of unspecified site: Secondary | ICD-10-CM

## 2021-07-26 DIAGNOSIS — Z951 Presence of aortocoronary bypass graft: Secondary | ICD-10-CM

## 2021-07-26 NOTE — Progress Notes (Signed)
Daily Session Note ? ?Patient Details  ?Name: Meredith Guerra ?MRN: [id number] ?Date of Birth: 1950/06/17 ?Referring Provider:   ?Flowsheet Row CARDIAC REHAB PHASE II ORIENTATION from 05/24/2021 in [place]  ?Referring Provider Dr. Darcey Nora  ? ?  ? ? ?Encounter Date: 07/26/2021 ? ?Check In: ? Session Check In - 07/26/21 1442   ? ?  ? Check-In  ? Supervising physician immediately available to respond to emergencies [facility] MD immediately available   ? Physician(s) Dr. Audie Box   ? Location [facility]   ? Staff Present Geanie Cooley, RN;Heather Otho Ket, BS, Exercise Physiologist;Dalton Fletcher, MS, ACSM-CEP, Exercise Physiologist   ? Virtual Visit No   ? Medication changes reported     No   ? Fall or balance concerns reported    No   ? Tobacco Cessation No Change   ? Warm-up and Cool-down Performed as group-led instruction   ? Resistance Training Performed Yes   ? VAD Patient? No   ? PAD/SET Patient? No   ?  ? Pain Assessment  ? Currently in Pain? No/denies   ? Multiple Pain Sites No   ? ?  ?  ? ?  ? ? ?Capillary Blood Glucose: ?No results found for this or any previous visit (from the past 24 hour(s)). ? ? ? ?Social History  ? ?Tobacco Use  ?Smoking Status Never  ?Smokeless Tobacco Never  ? ? ?Goals Met:  ?Independence with exercise equipment ?Exercise tolerated well ?No report of concerns or symptoms today ?Strength training completed today ? ?Goals Unmet:  ?Not Applicable ? ?Comments: check out @ 3:45pm ? ? ?Dr. Carlyle Dolly is Medical Director for [place] ?

## 2021-07-28 ENCOUNTER — Encounter (HOSPITAL_COMMUNITY): Payer: Medicare Other

## 2021-07-30 ENCOUNTER — Encounter (HOSPITAL_COMMUNITY): Payer: Medicare Other

## 2021-08-02 ENCOUNTER — Encounter (HOSPITAL_COMMUNITY): Payer: Medicare Other

## 2021-08-04 ENCOUNTER — Encounter (HOSPITAL_COMMUNITY): Payer: Medicare Other

## 2021-08-06 ENCOUNTER — Encounter (HOSPITAL_COMMUNITY)
Admission: RE | Admit: 2021-08-06 | Discharge: 2021-08-06 | Disposition: A | Discharge status: Home / Self Care | Payer: Medicare Other | Source: Ambulatory Visit | Attending: Cardiology | Admitting: Cardiology

## 2021-08-06 ENCOUNTER — Encounter (HOSPITAL_COMMUNITY): Payer: Medicare Other

## 2021-08-06 DIAGNOSIS — Z951 Presence of aortocoronary bypass graft: Secondary | ICD-10-CM | POA: Diagnosis not present

## 2021-08-06 DIAGNOSIS — I213 ST elevation (STEMI) myocardial infarction of unspecified site: Secondary | ICD-10-CM

## 2021-08-06 NOTE — Progress Notes (Signed)
Daily Session Note  Patient Details  Name: Meredith Guerra MRN: [id number] Date of Birth: [birth year] Referring Provider:   Flowsheet Row CARDIAC REHAB PHASE II ORIENTATION from 05/24/2021 in [place]  Referring Provider Dr. Darcey Nora       Encounter Date: 08/06/2021  Check In:  Session Check In - 08/06/21 1434       Check-In   Supervising physician immediately available to respond to emergencies [facility] MD immediately available    Physician(s) Dr. Gasper Sells    Location [facility]    Staff Present Geanie Cooley, RN;Heather Otho Ket, BS, Exercise Physiologist;Daphyne Hassell Done, RN, BSN    Virtual Visit No    Medication changes reported     No    Fall or balance concerns reported    No    Tobacco Cessation No Change    Warm-up and Cool-down Performed as group-led instruction    Resistance Training Performed Yes    VAD Patient? No    PAD/SET Patient? No      Pain Assessment   Currently in Pain? No/denies    Multiple Pain Sites No             Capillary Blood Glucose: No results found for this or any previous visit (from the past 24 hour(s)).    Social History   Tobacco Use  Smoking Status Never  Smokeless Tobacco Never    Goals Met:  Independence with exercise equipment Exercise tolerated well No report of concerns or symptoms today Strength training completed today  Goals Unmet:  Not Applicable  Comments: check out @ 3:45pm   Dr. Carlyle Dolly is Medical Director for [place]

## 2021-08-09 ENCOUNTER — Encounter (HOSPITAL_COMMUNITY)
Admission: RE | Admit: 2021-08-09 | Discharge: 2021-08-09 | Disposition: A | Discharge status: Home / Self Care | Payer: Medicare Other | Source: Ambulatory Visit | Attending: Cardiology | Admitting: Cardiology

## 2021-08-09 ENCOUNTER — Encounter (HOSPITAL_COMMUNITY): Payer: Medicare Other

## 2021-08-09 VITALS — Wt 165.3 lb

## 2021-08-09 DIAGNOSIS — Z951 Presence of aortocoronary bypass graft: Secondary | ICD-10-CM | POA: Diagnosis not present

## 2021-08-09 DIAGNOSIS — I213 ST elevation (STEMI) myocardial infarction of unspecified site: Secondary | ICD-10-CM

## 2021-08-09 NOTE — Progress Notes (Signed)
Daily Session Note  Patient Details  Name: Meredith Guerra MRN: [id number] Date of Birth: 08/07/1950 Referring Provider:   Flowsheet Row CARDIAC REHAB PHASE II ORIENTATION from 05/24/2021 in [place]  Referring Provider Dr. Darcey Nora       Encounter Date: 08/09/2021  Check In:  Session Check In - 08/09/21 1437       Check-In   Supervising physician immediately available to respond to emergencies [facility] MD immediately available    Physician(s) Dr Gardiner Rhyme    Location [facility]    Staff Present Geanie Cooley, RN;Heather Otho Ket, BS, Exercise Physiologist;Kumari Sculley Hassell Done, RN, Jennye Moccasin, RN, BSN    Virtual Visit No    Medication changes reported     No    Comments Tropol increased to 75m once daily by PCP.    Fall or balance concerns reported    No    Tobacco Cessation No Change    Warm-up and Cool-down Performed as group-led instruction    Resistance Training Performed Yes    VAD Patient? No    PAD/SET Patient? No      Pain Assessment   Currently in Pain? No/denies    Multiple Pain Sites No             Capillary Blood Glucose: No results found for this or any previous visit (from the past 24 hour(s)).    Social History   Tobacco Use  Smoking Status Never  Smokeless Tobacco Never    Goals Met:  Independence with exercise equipment Exercise tolerated well No report of concerns or symptoms today Strength training completed today  Goals Unmet:  Not Applicable  Comments: Check out at 1545.   Dr. JCarlyle Dollyis Medical Director [facility]

## 2021-08-11 ENCOUNTER — Encounter (HOSPITAL_COMMUNITY): Payer: Medicare Other

## 2021-08-11 ENCOUNTER — Encounter (HOSPITAL_COMMUNITY)
Admission: RE | Admit: 2021-08-11 | Discharge: 2021-08-11 | Disposition: A | Discharge status: Home / Self Care | Payer: Medicare Other | Source: Ambulatory Visit | Attending: Cardiology | Admitting: Cardiology

## 2021-08-11 DIAGNOSIS — Z951 Presence of aortocoronary bypass graft: Secondary | ICD-10-CM | POA: Diagnosis not present

## 2021-08-11 DIAGNOSIS — I213 ST elevation (STEMI) myocardial infarction of unspecified site: Secondary | ICD-10-CM

## 2021-08-11 NOTE — Progress Notes (Signed)
I have reviewed a Home Exercise Prescription with Norval Morton . Cadee is not currently exercising at home.  The patient was advised to walk 5 days a week for 30-45 minutes.  Jawanda and I discussed how to progress their exercise prescription.  The patient stated that their goals were build back her endurance.  The patient stated that they understand the exercise prescription.  We reviewed exercise guidelines, target heart rate during exercise, RPE Scale, weather conditions, NTG use, endpoints for exercise, warmup and cool down.  Patient is encouraged to come to me with any questions. I will continue to follow up with the patient to assist them with progression and safety.

## 2021-08-11 NOTE — Progress Notes (Signed)
Daily Session Note  Patient Details  Name: Meredith Guerra MRN: [id number] Date of Birth: 1950-11-24 Referring Provider:   Flowsheet Row CARDIAC REHAB PHASE II ORIENTATION from 05/24/2021 in [place]  Referring Provider Dr. Darcey Nora       Encounter Date: 08/11/2021  Check In:  Session Check In - 08/11/21 1436       Check-In   Supervising physician immediately available to respond to emergencies [facility] MD immediately available    Physician(s) Dr Domenic Polite    Location [facility]    Staff Present Redge Gainer, BS, Exercise Physiologist;[name], RN, Jennye Moccasin, RN, BSN    Virtual Visit No    Medication changes reported     No    Fall or balance concerns reported    No    Tobacco Cessation No Change    Warm-up and Cool-down Performed as group-led instruction    Resistance Training Performed Yes    VAD Patient? No    PAD/SET Patient? No      Pain Assessment   Currently in Pain? No/denies    Multiple Pain Sites No             Capillary Blood Glucose: No results found for this or any previous visit (from the past 24 hour(s)).    Social History   Tobacco Use  Smoking Status Never  Smokeless Tobacco Never    Goals Met:  Independence with exercise equipment Exercise tolerated well No report of concerns or symptoms today Strength training completed today  Goals Unmet:  Not Applicable  Comments: Checkout at 1545.   Dr. Carlyle Dolly is Medical Director for [facility]

## 2021-08-11 NOTE — Progress Notes (Signed)
Cardiac Individual Treatment Plan  Patient Details  Name: Meredith Guerra MRN: [id number] Date of Birth: 1951-01-20 Referring Provider:   Flowsheet Row CARDIAC REHAB PHASE II ORIENTATION from 05/24/2021 in [facility] CARDIAC REHABILITATION  Referring Provider Dr. Maren Beach       Initial Encounter Date:  Flowsheet Row CARDIAC REHAB PHASE II ORIENTATION from 05/24/2021 in [place] CARDIAC REHABILITATION  Date 05/24/21       Visit Diagnosis: ST elevation myocardial infarction (STEMI), unspecified artery (HCC)  S/P CABG x 3  Patient's Home Medications on Admission:  Current Outpatient Medications:    acetaminophen (TYLENOL) 500 MG tablet, Take 1,000 mg by mouth every 6 (six) hours as needed for mild pain, headache or fever., Disp: , Rfl:    albuterol (PROVENTIL) 2 MG tablet, Take 2 mg by mouth 3 (three) times daily as needed for wheezing or shortness of breath., Disp: , Rfl:    albuterol (VENTOLIN HFA) 108 (90 Base) MCG/ACT inhaler, Inhale 2 puffs into the lungs in the morning and at bedtime., Disp: , Rfl:    amiodarone (PACERONE) 200 MG tablet, Take 1 tablet (200 mg total) by mouth daily. TID for 7 days, then BID for 7 days, then once daily, Disp: 90 tablet, Rfl: 1   amLODipine (NORVASC) 5 MG tablet, Take 1 tablet (5 mg total) by mouth daily., Disp: 90 tablet, Rfl: 3   aspirin 81 MG EC tablet, Take 1 tablet (81 mg total) by mouth daily., Disp: 90 tablet, Rfl: 3   atorvastatin (LIPITOR) 80 MG tablet, Take 1 tablet (80 mg total) by mouth daily., Disp: 90 tablet, Rfl: 3   budesonide-formoterol (SYMBICORT) 160-4.5 MCG/ACT inhaler, Inhale 2 puffs into the lungs 2 (two) times daily., Disp: , Rfl:    butalbital-acetaminophen-caffeine (FIORICET) 50-325-40 MG tablet, Take 1 tablet by mouth 2 (two) times daily as needed for migraine., Disp: , Rfl:    Cholecalciferol (VITAMIN D-3) 25 MCG (1000 UT) CAPS, Take 1,000 Units by mouth daily., Disp: , Rfl:    clonazePAM (KLONOPIN) 0.5 MG tablet, Take 0.5 mg  by mouth daily as needed., Disp: , Rfl:    clopidogrel (PLAVIX) 75 MG tablet, Take 1 tablet (75 mg total) by mouth daily., Disp: 90 tablet, Rfl: 3   DULoxetine (CYMBALTA) 30 MG capsule, Take 30 mg by mouth daily., Disp: , Rfl:    DULoxetine (CYMBALTA) 60 MG capsule, Take 60 mg by mouth at bedtime., Disp: , Rfl:    empagliflozin (JARDIANCE) 10 MG TABS tablet, Take 1 tablet (10 mg total) by mouth daily before breakfast., Disp: 30 tablet, Rfl: 3   ketotifen (ZADITOR) 0.025 % ophthalmic solution, Place 1 drop into both eyes 2 (two) times daily as needed (allergies)., Disp: , Rfl:    levothyroxine (SYNTHROID) 25 MCG tablet, Take 37.5 mcg by mouth daily before breakfast., Disp: , Rfl:    loratadine (CLARITIN) 10 MG tablet, Take 10 mg by mouth daily., Disp: , Rfl:    losartan (COZAAR) 25 MG tablet, Take 0.5 tablets (12.5 mg total) by mouth daily., Disp: 60 tablet, Rfl: 3   metoprolol succinate (TOPROL-XL) 25 MG 24 hr tablet, Take 1 tablet (25 mg total) by mouth daily. Take with or immediately following a meal., Disp: 90 tablet, Rfl: 3   montelukast (SINGULAIR) 10 MG tablet, Take 10 mg by mouth at bedtime., Disp: , Rfl:    nitroGLYCERIN (NITROSTAT) 0.4 MG SL tablet, Place 1 tablet (0.4 mg total) under the tongue every 5 (five) minutes as needed for chest pain.,  Disp: 30 tablet, Rfl: 1   ondansetron (ZOFRAN) 4 MG tablet, Take 4 mg by mouth every 8 (eight) hours as needed for nausea or vomiting., Disp: , Rfl:    pantoprazole (PROTONIX) 40 MG tablet, Take 80 mg by mouth daily., Disp: , Rfl:    promethazine (PHENERGAN) 12.5 MG tablet, Take 12.5 mg by mouth every 4 (four) hours as needed for nausea or vomiting., Disp: , Rfl:    Rimegepant Sulfate (NURTEC) 75 MG TBDP, Take 75 mg by mouth daily as needed (migraines)., Disp: , Rfl:    tiZANidine (ZANAFLEX) 4 MG tablet, Take 2-8 mg by mouth See admin instructions. Take 8 mg every night, may take 2 mg during the day as needed for migraines, Disp: , Rfl:    traZODone  (DESYREL) 150 MG tablet, Take 150 mg by mouth at bedtime., Disp: , Rfl:   Past Medical History: Past Medical History:  Diagnosis Date   Asthma    Colitis    Hypertension    Kidney stones    Migraines     Tobacco Use: Social History   Tobacco Use  Smoking Status Never  Smokeless Tobacco Never    Labs: Review Flowsheet        Latest Ref Rng & Units 03/30/2021 04/06/2021 04/07/2021 04/30/2021  Labs for ITP Cardiac and Pulmonary Rehab  Cholestrol 100 - 199 mg/dL 161     096    LDL (calc) 0 - 99 mg/dL 045     78    HDL-C >40 mg/dL 51     58    Trlycerides 0 - 149 mg/dL 981     191    Hemoglobin A1c 4.8 - 5.6 % 6.2       PH, Arterial 7.350 - 7.450  7.301     7.307     7.385     7.397     7.399     7.413     7.435     7.487     7.519     7.406     7.387      PCO2 arterial 32.0 - 48.0 mmHg  47.5     47.3     39.0     39.1     41.7     40.0     38.4     33.9     33.8     42.4     42.2      Bicarbonate 20.0 - 28.0 mmol/L  23.7     23.9     23.6     24.1     25.8     25.5     25.8     25.6     26.6     27.6     26.7     24.9      TCO2 22 - 32 mmol/L  25     25     25     25     25     27     27     25     27     27     26     28     29     24     28     27       Acid-base deficit 0.0 - 2.0 mmol/L  3.0     3.0     2.0     1.0  O2 Saturation %  99.0     99.0     100.0     100.0     100.0     100.0     100.0     100.0     82.0     100.0     100.0     94.6   64.1         Multiple values from one day are sorted in reverse-chronological order        Capillary Blood Glucose: Lab Results  Component Value Date   GLUCAP 94 06/02/2021   GLUCAP 120 (H) 05/28/2021   GLUCAP 123 (H) 05/26/2021   GLUCAP 92 05/24/2021   GLUCAP 108 (H) 04/09/2021     Exercise Target Goals: Exercise Program Goal: Individual exercise prescription set using results from initial 6 min walk test and THRR while considering  patient's activity barriers  and safety.   Exercise Prescription Goal: Starting with aerobic activity 30 plus minutes a day, 3 days per week for initial exercise prescription. Provide home exercise prescription and guidelines that participant acknowledges understanding prior to discharge.  Activity Barriers & Risk Stratification:  Activity Barriers & Cardiac Risk Stratification - 05/24/21 1305       Activity Barriers & Cardiac Risk Stratification   Activity Barriers Joint Problems;Shortness of Breath    Cardiac Risk Stratification High             6 Minute Walk:  6 Minute Walk     Row Name 05/24/21 1356         6 Minute Walk   Phase Initial     Distance 1300 feet     Walk Time 6 minutes     # of Rest Breaks 0     MPH 2.46     METS 2.73     RPE 12     VO2 Peak 9.58     Symptoms No     Resting HR 66 bpm     Resting BP 112/72     Resting Oxygen Saturation  95 %     Exercise Oxygen Saturation  during 6 min walk 96 %     Max Ex. HR 89 bpm     Max Ex. BP 148/76     2 Minute Post BP 120/72              Oxygen Initial Assessment:   Oxygen Re-Evaluation:   Oxygen Discharge (Final Oxygen Re-Evaluation):   Initial Exercise Prescription:  Initial Exercise Prescription - 05/24/21 1300       Date of Initial Exercise RX and Referring Provider   Date 05/24/21    Referring Provider Dr. Maren Beach    Expected Discharge Date 08/20/21      Treadmill   MPH 1.8    Grade 0    Minutes 17      NuStep   Level 1    SPM 60    Minutes 22      Prescription Details   Frequency (times per week) 3    Duration Progress to 30 minutes of continuous aerobic without signs/symptoms of physical distress      Intensity   THRR 40-80% of Max Heartrate 60-120    Ratings of Perceived Exertion 11-13      Progression   Progression Continue progressive overload as per policy without signs/symptoms or physical distress.      Resistance Training   Training Prescription Yes    Weight 3  Reps 10-15              Perform Capillary Blood Glucose checks as needed.  Exercise Prescription Changes:   Exercise Prescription Changes     Row Name 05/28/21 1543 06/09/21 1550 06/28/21 1500 07/12/21 1530 07/26/21 1500     Response to Exercise   Blood Pressure (Admit) 118/68 148/80 126/72 130/66 110/60   Blood Pressure (Exercise) 132/72 132/78 148/78 148/76 160/62   Blood Pressure (Exit) 116/70 122/66 126/66 122/72 120/60   Heart Rate (Admit) 86 bpm 67 bpm 66 bpm 80 bpm 82 bpm   Heart Rate (Exercise) 110 bpm 93 bpm 87 bpm 106 bpm 118 bpm   Heart Rate (Exit) 95 bpm 78 bpm 74 bpm 88 bpm 88 bpm   Rating of Perceived Exertion (Exercise) Duration Continue with 30 min of aerobic exercise without signs/symptoms of physical distress. Continue with 30 min of aerobic exercise without signs/symptoms of physical distress. Continue with 30 min of aerobic exercise without signs/symptoms of physical distress. Continue with 30 min of aerobic exercise without signs/symptoms of physical distress. Continue with 30 min of aerobic exercise without signs/symptoms of physical distress.   Intensity THRR unchanged THRR unchanged THRR unchanged THRR unchanged THRR unchanged     Progression   Progression Continue to progress workloads to maintain intensity without signs/symptoms of physical distress. Continue to progress workloads to maintain intensity without signs/symptoms of physical distress. Continue to progress workloads to maintain intensity without signs/symptoms of physical distress. Continue to progress workloads to maintain intensity without signs/symptoms of physical distress. Continue to progress workloads to maintain intensity without signs/symptoms of physical distress.     Resistance Training   Training Prescription Yes Yes Yes Yes Yes   Weight Reps 10-15 10-15 10-15 10-15 10-15   Time 10 Minutes 10 Minutes 10 Minutes 10 Minutes 10 Minutes     Treadmill   MPH 1.7 2 2.5 2.5 3    Grade 0 0 0 0 0   Minutes METs 2.3 2.53 2.91 2.91 3.3     NuStep   Level SPM 99 88 104 109 137   Minutes METs 3.46 2.06 2.58 3.13 4.13    Row Name 08/09/21 1530             Response to Exercise   Blood Pressure (Admit) 136/78       Blood Pressure (Exercise) 142/72       Blood Pressure (Exit) 122/62       Heart Rate (Admit) 77 bpm       Heart Rate (Exercise) 116 bpm       Heart Rate (Exit) 83 bpm       Rating of Perceived Exertion (Exercise) 11       Duration Continue with 30 min of aerobic exercise without signs/symptoms of physical distress.       Intensity THRR unchanged         Progression   Progression Continue to progress workloads to maintain intensity without signs/symptoms of physical distress.         Resistance Training   Training Prescription Yes       Weight 3       Reps 10-15       Time 10 Minutes         Treadmill  MPH 3       Grade 0       Minutes 17       METs 3.3         NuStep   Level 2       SPM 134       Minutes 22       METs 3.86                Exercise Comments:   Exercise Goals and Review:   Exercise Goals     Row Name 05/24/21 1359 06/09/21 1550 07/12/21 1530 08/10/21 0756       Exercise Goals   Increase Physical Activity Yes Yes Yes Yes    Intervention Provide advice, education, support and counseling about physical activity/exercise needs.;Develop an individualized exercise prescription for aerobic and resistive training based on initial evaluation findings, risk stratification, comorbidities and participant's personal goals. Provide advice, education, support and counseling about physical activity/exercise needs.;Develop an individualized exercise prescription for aerobic and resistive training based on initial evaluation findings, risk stratification, comorbidities and participant's personal goals. Provide advice, education, support and counseling about physical  activity/exercise needs.;Develop an individualized exercise prescription for aerobic and resistive training based on initial evaluation findings, risk stratification, comorbidities and participant's personal goals. Provide advice, education, support and counseling about physical activity/exercise needs.;Develop an individualized exercise prescription for aerobic and resistive training based on initial evaluation findings, risk stratification, comorbidities and participant's personal goals.    Expected Outcomes Short Term: Attend rehab on a regular basis to increase amount of physical activity.;Long Term: Add in home exercise to make exercise part of routine and to increase amount of physical activity.;Long Term: Exercising regularly at least 3-5 days a week. Short Term: Attend rehab on a regular basis to increase amount of physical activity.;Long Term: Add in home exercise to make exercise part of routine and to increase amount of physical activity.;Long Term: Exercising regularly at least 3-5 days a week. Short Term: Attend rehab on a regular basis to increase amount of physical activity.;Long Term: Add in home exercise to make exercise part of routine and to increase amount of physical activity.;Long Term: Exercising regularly at least 3-5 days a week. Short Term: Attend rehab on a regular basis to increase amount of physical activity.;Long Term: Add in home exercise to make exercise part of routine and to increase amount of physical activity.;Long Term: Exercising regularly at least 3-5 days a week.    Increase Strength and Stamina Yes Yes Yes Yes    Intervention Provide advice, education, support and counseling about physical activity/exercise needs.;Develop an individualized exercise prescription for aerobic and resistive training based on initial evaluation findings, risk stratification, comorbidities and participant's personal goals. Provide advice, education, support and counseling about physical  activity/exercise needs.;Develop an individualized exercise prescription for aerobic and resistive training based on initial evaluation findings, risk stratification, comorbidities and participant's personal goals. Provide advice, education, support and counseling about physical activity/exercise needs.;Develop an individualized exercise prescription for aerobic and resistive training based on initial evaluation findings, risk stratification, comorbidities and participant's personal goals. Provide advice, education, support and counseling about physical activity/exercise needs.;Develop an individualized exercise prescription for aerobic and resistive training based on initial evaluation findings, risk stratification, comorbidities and participant's personal goals.    Expected Outcomes Short Term: Increase workloads from initial exercise prescription for resistance, speed, and METs.;Short Term: Perform resistance training exercises routinely during rehab and add in resistance training at home;Long Term: Improve cardiorespiratory fitness, muscular endurance and strength  as measured by increased METs and functional capacity ( ) Short Term: Increase workloads from initial exercise prescription for resistance, speed, and METs.;Short Term: Perform resistance training exercises routinely during rehab and add in resistance training at home;Long Term: Improve cardiorespiratory fitness, muscular endurance and strength as measured by increased METs and functional capacity ( ) Short Term: Increase workloads from initial exercise prescription for resistance, speed, and METs.;Short Term: Perform resistance training exercises routinely during rehab and add in resistance training at home;Long Term: Improve cardiorespiratory fitness, muscular endurance and strength as measured by increased METs and functional capacity ( ) Short Term: Increase workloads from initial exercise prescription for resistance, speed, and  METs.;Short Term: Perform resistance training exercises routinely during rehab and add in resistance training at home;Long Term: Improve cardiorespiratory fitness, muscular endurance and strength as measured by increased METs and functional capacity ( )    Able to understand and use rate of perceived exertion (RPE) scale Yes Yes Yes Yes    Intervention Provide education and explanation on how to use RPE scale Provide education and explanation on how to use RPE scale Provide education and explanation on how to use RPE scale Provide education and explanation on how to use RPE scale    Expected Outcomes Short Term: Able to use RPE daily in rehab to express subjective intensity level;Long Term:  Able to use RPE to guide intensity level when exercising independently Short Term: Able to use RPE daily in rehab to express subjective intensity level;Long Term:  Able to use RPE to guide intensity level when exercising independently Short Term: Able to use RPE daily in rehab to express subjective intensity level;Long Term:  Able to use RPE to guide intensity level when exercising independently Short Term: Able to use RPE daily in rehab to express subjective intensity level;Long Term:  Able to use RPE to guide intensity level when exercising independently    Knowledge and understanding of Target Heart Rate Range (THRR) Yes Yes Yes Yes    Intervention Provide education and explanation of THRR including how the numbers were predicted and where they are located for reference Provide education and explanation of THRR including how the numbers were predicted and where they are located for reference Provide education and explanation of THRR including how the numbers were predicted and where they are located for reference Provide education and explanation of THRR including how the numbers were predicted and where they are located for reference    Expected Outcomes Short Term: Able to state/look up THRR;Long Term: Able to use  THRR to govern intensity when exercising independently;Short Term: Able to use daily as guideline for intensity in rehab Short Term: Able to state/look up THRR;Long Term: Able to use THRR to govern intensity when exercising independently;Short Term: Able to use daily as guideline for intensity in rehab Short Term: Able to state/look up THRR;Long Term: Able to use THRR to govern intensity when exercising independently;Short Term: Able to use daily as guideline for intensity in rehab Short Term: Able to state/look up THRR;Long Term: Able to use THRR to govern intensity when exercising independently;Short Term: Able to use daily as guideline for intensity in rehab    Able to check pulse independently Yes Yes Yes Yes    Intervention Provide education and demonstration on how to check pulse in carotid and radial arteries.;Review the importance of being able to check your own pulse for safety during independent exercise Provide education and demonstration on how to check pulse in carotid and radial arteries.;Review the importance of  being able to check your own pulse for safety during independent exercise Provide education and demonstration on how to check pulse in carotid and radial arteries.;Review the importance of being able to check your own pulse for safety during independent exercise Provide education and demonstration on how to check pulse in carotid and radial arteries.;Review the importance of being able to check your own pulse for safety during independent exercise    Expected Outcomes Short Term: Able to explain why pulse checking is important during independent exercise;Long Term: Able to check pulse independently and accurately Short Term: Able to explain why pulse checking is important during independent exercise;Long Term: Able to check pulse independently and accurately Short Term: Able to explain why pulse checking is important during independent exercise;Long Term: Able to check pulse independently  and accurately Short Term: Able to explain why pulse checking is important during independent exercise;Long Term: Able to check pulse independently and accurately    Understanding of Exercise Prescription Yes Yes Yes Yes    Intervention Provide education, explanation, and written materials on patient's individual exercise prescription Provide education, explanation, and written materials on patient's individual exercise prescription Provide education, explanation, and written materials on patient's individual exercise prescription Provide education, explanation, and written materials on patient's individual exercise prescription    Expected Outcomes Short Term: Able to explain program exercise prescription;Long Term: Able to explain home exercise prescription to exercise independently Short Term: Able to explain program exercise prescription;Long Term: Able to explain home exercise prescription to exercise independently Short Term: Able to explain program exercise prescription;Long Term: Able to explain home exercise prescription to exercise independently Short Term: Able to explain program exercise prescription;Long Term: Able to explain home exercise prescription to exercise independently             Exercise Goals Re-Evaluation :  Exercise Goals Re-Evaluation     Row Name 06/09/21 1550 07/12/21 1530 08/10/21 0757 08/10/21 0804       Exercise Goal Re-Evaluation   Exercise Goals Review Increase Physical Activity;Increase Strength and Stamina;Able to understand and use rate of perceived exertion (RPE) scale;Knowledge and understanding of Target Heart Rate Range (THRR);Able to check pulse independently;Understanding of Exercise Prescription Increase Physical Activity;Increase Strength and Stamina;Able to understand and use rate of perceived exertion (RPE) scale;Knowledge and understanding of Target Heart Rate Range (THRR);Able to check pulse independently;Understanding of Exercise Prescription  Increase Physical Activity;Increase Strength and Stamina;Able to understand and use rate of perceived exertion (RPE) scale;Knowledge and understanding of Target Heart Rate Range (THRR);Able to check pulse independently;Understanding of Exercise Prescription Increase Physical Activity;Increase Strength and Stamina;Able to understand and use rate of perceived exertion (RPE) scale;Knowledge and understanding of Target Heart Rate Range (THRR);Able to check pulse independently;Understanding of Exercise Prescription    Comments Pt has completed 7 session of cardiac rehab. She has recently had slight chest pain with exercise and she is following up with Dr. She is currently exercising at 2.53 METs on the treadmill. Will continue to monitor and progress as able. Pt has completed 11 sessions of cardiac rehab. She is no longer experiencing chest disconfort while exercising. She is currently exercising at 3.13 METs on the stepper. Will continue to monitor and progress as able. Pt has completed 18 sessions of cardiac rehab. She is motivated when she comes into class and progressing her workload. She complained about R ankle pain starting to hurt, she had broke this ankle years ago and havent given her much issues until recently. We adviced her tor change from  treadmill to another machine that has less stress on the ankle. She is currently exercsing at Pt has completed 18 sessions of cardiac rehab. She is motivated when she comes into class and progressing her workload. She complained about R ankle pain starting to hurt, she had broke this ankle years ago and havent given her much issues until recently. We adviced her tor change from treadmill to another machine that has less stress on the ankle. She is currently exercsing at 2.86 METs on the stepper. Will continue to monitor and progress as able.    Expected Outcomes Through exercise at rehab and at home, the patient will meet their stated goals. Through exercise at rehab and  at home, the patient will meet their stated goals. Through exercise at rehab and at home, the patient will meet their stated goals. Through exercise at rehab and at home, the patient will meet their stated goals.              Discharge Exercise Prescription (Final Exercise Prescription Changes):  Exercise Prescription Changes - 08/09/21 1530       Response to Exercise   Blood Pressure (Admit) 136/78    Blood Pressure (Exercise) 142/72    Blood Pressure (Exit) 122/62    Heart Rate (Admit) 77 bpm    Heart Rate (Exercise) 116 bpm    Heart Rate (Exit) 83 bpm    Rating of Perceived Exertion (Exercise) 11    Duration Continue with 30 min of aerobic exercise without signs/symptoms of physical distress.    Intensity THRR unchanged      Progression   Progression Continue to progress workloads to maintain intensity without signs/symptoms of physical distress.      Resistance Training   Training Prescription Yes    Weight 3    Reps 10-15    Time 10 Minutes      Treadmill   MPH 3    Grade 0    Minutes 17    METs 3.3      NuStep   Level 2    SPM 134    Minutes 22    METs 3.86             Nutrition:  Target Goals: Understanding of nutrition guidelines, daily intake of sodium 1500mg , cholesterol 200mg , calories 30% from fat and 7% or less from saturated fats, daily to have 5 or more servings of fruits and vegetables.  Biometrics:  Pre Biometrics - 05/24/21 1359       Pre Biometrics   Height 5\' 3"  (1.6 m)    Weight 75 kg    Waist Circumference 39.5 inches    Hip Circumference 39 inches    Waist to Hip Ratio 1.01 %    BMI (Calculated) 29.3    Triceps Skinfold 24 mm    % Body Fat 41 %    Grip Strength 21 kg    Flexibility 8 in    Single Leg Stand 5 seconds              Nutrition Therapy Plan and Nutrition Goals:  Nutrition Therapy & Goals - 06/07/21 0710       Personal Nutrition Goals   Comments Patient scored 15 on her diet assessment. We offer 2  educational sessions on heart healthy nutrition with handouts and assistance with RD referral if patient is interested.      Intervention Plan   Intervention Nutrition handout(s) given to patient.    Expected Outcomes Short Term Goal:  Understand basic principles of dietary content, such as calories, fat, sodium, cholesterol and nutrients.             Nutrition Assessments:  Nutrition Assessments - 05/24/21 1308       MEDFICTS Scores   Pre Score 15            MEDIFICTS Score Key: ?70 Need to make dietary changes  40-70 Heart Healthy Diet ? 40 Therapeutic Level Cholesterol Diet   Picture Your Plate Scores: <76 Unhealthy dietary pattern with much room for improvement. 41-50 Dietary pattern unlikely to meet recommendations for good health and room for improvement. 51-60 More healthful dietary pattern, with some room for improvement.  >60 Healthy dietary pattern, although there may be some specific behaviors that could be improved.    Nutrition Goals Re-Evaluation:   Nutrition Goals Discharge (Final Nutrition Goals Re-Evaluation):   Psychosocial: Target Goals: Acknowledge presence or absence of significant depression and/or stress, maximize coping skills, provide positive support system. Participant is able to verbalize types and ability to use techniques and skills needed for reducing stress and depression.  Initial Review & Psychosocial Screening:  Initial Psych Review & Screening - 05/24/21 1306       Initial Review   Current issues with Current Anxiety/Panic   On Cymbalta     Family Dynamics   Good Support System? Yes    Comments Her support system includes her children and her husband are her main support system.      Barriers   Psychosocial barriers to participate in program There are no identifiable barriers or psychosocial needs.      Screening Interventions   Interventions Encouraged to exercise    Expected Outcomes Long Term goal: The participant  improves quality of Life and PHQ9 Scores as seen by post scores and/or verbalization of changes;Short Term goal: Identification and review with participant of any Quality of Life or Depression concerns found by scoring the questionnaire.             Quality of Life Scores:  Quality of Life - 05/24/21 1401       Quality of Life   Select Quality of Life      Quality of Life Scores   Health/Function Pre 23.7 %    Socioeconomic Pre 28.07 %    Psych/Spiritual Pre 26.57 %    Family Pre 28.8 %    GLOBAL Pre 25.94 %            Scores of 19 and below usually indicate a poorer quality of life in these areas.  A difference of  2-3 points is a clinically meaningful difference.  A difference of 2-3 points in the total score of the Quality of Life Index has been associated with significant improvement in overall quality of life, self-image, physical symptoms, and general health in studies assessing change in quality of life.  PHQ-9: Review Flowsheet        05/24/2021  Depression screen PHQ 2/9  Decreased Interest 0  Down, Depressed, Hopeless 0  PHQ - 2 Score 0  Altered sleeping 0  Tired, decreased energy 1  Change in appetite 0  Feeling bad or failure about yourself  0  Trouble concentrating 2  Moving slowly or fidgety/restless 1  Suicidal thoughts 0  PHQ-9 Score 4  Difficult doing work/chores Not difficult at all         Interpretation of Total Score  Total Score Depression Severity:  1-4 = Minimal depression, 5-9 = Mild  depression, 10-14 = Moderate depression, 15-19 = Moderately severe depression, 20-27 = Severe depression   Psychosocial Evaluation and Intervention:  Psychosocial Evaluation - 05/24/21 1420       Psychosocial Evaluation & Interventions   Interventions Encouraged to exercise with the program and follow exercise prescription    Comments Pt has no barriers to participating in cardiac rehab. She has no identifiable psychosocial issues. She does have anxiety  which is managed by Cymbalta. She reports that her Cymbalta was doubled from 30 mg daily to 60 mg daily about a year and a half ago. Her grandson commited suicide and this was an extremely tough time for her. She denies any current depression. She scored a 4 on her PHQ-9. She relates all of this to her current health. She is slowly getting her energy back and is feeling better everyday. 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 She does have trouble concentrating when reading due to this. She reports that she has a good support system with her three children and her husband. Her goals while in the program are to lose weight, decrease her SOB with exertion, and to be able to resume her outdoor activities such as fishing and kayaking. She reports that she has already made lifestyle changes since her STEMI, and she has cleaned her diet up a lot. She is eager to start the program and to further improve her lifestyle.    Expected Outcomes Pt's anxiety will continue to be treated with Cymbalta, and pt will have no other identifiable psychosocial issues.    Continue Psychosocial Services  No Follow up required             Psychosocial Re-Evaluation:  Psychosocial Re-Evaluation     Row Name 06/07/21 0717 07/05/21 0730 08/02/21 0802         Psychosocial Re-Evaluation   Current issues with Current Anxiety/Panic Current Anxiety/Panic Current Anxiety/Panic     Comments Patient is new to the program completing 5 sessions. She seems to enjoy the sessions and demonstrates an interest in improving her health. She continues to have no psychosocial barriers identified. Her anxiety continues to be managed with Duloxetine and Trazodone. Patient has completed 10 sessions. She continues to have no psychosocial barriers identified. She continues to enjoy the sessions and demonstrates an interest in improving her health. Her anxiety  continues to be managed with Duloxetine, clonzapam, and Trazodone.We will continue to monitor her progress. Patient has completed 16 sessions. She continues to have no psychosocial barriers identified. She continues to enjoy the sessions and demonstrates an interest in improving her health. Her anxiety continues to be managed with Duloxetine, clonzapam, and Trazodone.We will continue to monitor her progress.     Expected Outcomes Patient will continue to have no psychosocial barriers identified and her anxiety will continue to be managed. Patient will continue to have no psychosocial barriers identified and her anxiety will continue to be managed. Patient will continue to have no psychosocial barriers identified and her anxiety will continue to be managed.     Interventions Stress management education;Encouraged to attend Cardiac Rehabilitation for the exercise;Relaxation education Stress management education;Encouraged to attend Cardiac Rehabilitation for the exercise;Relaxation education Stress management education;Encouraged to attend Cardiac Rehabilitation for the exercise;Relaxation education     Continue Psychosocial Services  No Follow up required No Follow up required No Follow up required  Psychosocial Discharge (Final Psychosocial Re-Evaluation):  Psychosocial Re-Evaluation - 08/02/21 0802       Psychosocial Re-Evaluation   Current issues with Current Anxiety/Panic    Comments Patient has completed 16 sessions. She continues to have no psychosocial barriers identified. She continues to enjoy the sessions and demonstrates an interest in improving her health. Her anxiety continues to be managed with Duloxetine, clonzapam, and Trazodone.We will continue to monitor her progress.    Expected Outcomes Patient will continue to have no psychosocial barriers identified and her anxiety will continue to be managed.    Interventions Stress management education;Encouraged to attend  Cardiac Rehabilitation for the exercise;Relaxation education    Continue Psychosocial Services  No Follow up required             Vocational Rehabilitation: Provide vocational rehab assistance to qualifying candidates.   Vocational Rehab Evaluation & Intervention:  Vocational Rehab - 05/24/21 1313       Initial Vocational Rehab Evaluation & Intervention   Assessment shows need for Vocational Rehabilitation No   She is retired and not going back to work            Education: Education Goals: Education classes will be provided on a weekly basis, covering required topics. Participant will state understanding/return demonstration of topics presented.  Learning Barriers/Preferences:  Learning Barriers/Preferences - 05/24/21 1310       Learning Barriers/Preferences   Learning Barriers None    Learning Preferences Skilled Demonstration             Education Topics: Hypertension, Hypertension Reduction -Define heart disease and high blood pressure. Discus how high blood pressure affects the body and ways to reduce high blood pressure. Flowsheet Row CARDIAC REHAB PHASE II EXERCISE from 07/14/2021 in [place] C[facility]AC [facility]AB[facility]IT[facility]  Date 06/23/21  Educator HJ  Instruction Review Code 1- Verbalizes Understanding       Exercise and Your Heart -Discuss why it is important to exercise, the FITT principles of exercise, normal and abnormal responses to exercise, and how to exercise safely.   Angina -Discuss definition of angina, causes of angina, treatment of angina, and how to decrease risk of having angina.   Cardiac Medications -Review what the following cardiac medications are used for, how they affect the body, and side effects that may occur when taking the medications.  Medications include Aspirin, Beta blockers, calcium channel blockers, ACE Inhibitors, angiotensin receptor blockers, diuretics, digoxin, and antihyperlipidemics. Flowsheet Row CARDIAC REHAB PHASE  II EXERCISE from 07/14/2021 in [place] C[facility]AC [facility]AB[facility]  Date 07/14/21  Educator DJ  Instruction Review Code 1- Verbalizes Understanding       Congestive Heart Failure -Discuss the definition of CHF, how to live with CHF, the signs and symptoms of CHF, and how keep track of weight and sodium intake.   Heart Disease and Intimacy -Discus the effect sexual activity has on the heart, how changes occur during intimacy as we age, and safety during sexual activity.   Smoking Cessation / COPD -Discuss different methods to quit smoking, the health benefits of quitting smoking, and the definition of COPD.   Nutrition I: Fats -Discuss the types of cholesterol, what cholesterol does to the heart, and how cholesterol levels can be controlled.   Nutrition II: Labels -Discuss the different components of food labels and how to read food label   Heart Parts/Heart Disease and PAD -Discuss the anatomy of the heart, the pathway of blood circulation through the heart, and these are affected by heart disease.  Flowsheet Row CARDIAC REHAB PHASE II EXERCISE from 07/14/2021 in [place] C[facility]IAC [facility]HAB[facility]  Date 05/26/21  Educator pb  Instruction Review Code 1- Verbalizes Understanding       Stress I: Signs and Symptoms -Discuss the causes of stress, how stress may lead to anxiety and depression, and ways to limit stress. Flowsheet Row CARDIAC REHAB PHASE II EXERCISE from 07/14/2021 in [place] C[facility]IAC REHAB[facility]  Date 06/02/21  Educator HJ  Instruction Review Code 1- Verbalizes Understanding       Stress II: Relaxation -Discuss different types of relaxation techniques to limit stress.   Warning Signs of Stroke / TIA -Discuss definition of a stroke, what the signs and symptoms are of a stroke, and how to identify when someone is having stroke.   Knowledge Questionnaire Score:  Knowledge Questionnaire Score - 05/24/21 1311       Knowledge Questionnaire Score   Pre  Score 22/24             Core Components/Risk Factors/Patient Goals at Admission:  Personal Goals and Risk Factors at Admission - 05/24/21 1314       Core Components/Risk Factors/Patient Goals on Admission    Weight Management Yes;Weight Loss    Intervention Weight Management: Develop a combined nutrition and exercise program designed to reach desired caloric intake, while maintaining appropriate intake of nutrient and fiber, sodium and fats, and appropriate energy expenditure required for the weight goal.;Weight Management: Provide education and appropriate resources to help participant work on and attain dietary goals.;Weight Management/Obesity: Establish reasonable short term and long term weight goals.;Obesity: Provide education and appropriate resources to help participant work on and attain dietary goals.    Expected Outcomes Short Term: Continue to assess and modify interventions until short term weight is achieved;Long Term: Adherence to nutrition and physical activity/exercise program aimed toward attainment of established weight goal;Weight Maintenance: Understanding of the daily nutrition guidelines, which includes 25-35% calories from fat, 7% or less cal from saturated fats, less than  cholesterol, less than 1.5gm of sodium, & 5 or more servings of fruits and vegetables daily;Weight Loss: Understanding of general recommendations for a balanced deficit meal plan, which promotes 1-2 lb weight loss per week and includes a negative energy balance of [phone] kcal/d;Understanding recommendations for meals to include 15-35% energy as protein, 25-35% energy from fat, 35-60% energy from carbohydrates, less than  of dietary cholesterol, 20-35 gm of total fiber daily;Understanding of distribution of calorie intake throughout the day with the consumption of 4-5 meals/snacks    Improve shortness of breath with ADL's Yes    Intervention Provide education, individualized exercise plan and  daily activity instruction to help decrease symptoms of SOB with activities of daily living.    Expected Outcomes Short Term: Improve cardiorespiratory fitness to achieve a reduction of symptoms when performing ADLs;Long Term: Be able to perform more ADLs without symptoms or delay the onset of symptoms    Personal Goal Other Yes    Personal Goal Be able to resume outdoor activities such as fishing and kayaking.    Intervention Attend CR three times per week and begin a home exercise program.    Expected Outcomes Pt will be able to resume her previous outdoor activities without issues.             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 She has multiple risk factors for CAD and is participating in the program for risk modification. She has completed 5 sessions. Her initial wegith was 165.7 and her current weight is 165.0 lbs. Her last A1C was 6.2% 03/30/21. She is on Jardiance. Her personal goals for the program are to lost weight; decrease her SOB with exertion; be able to resume her outdoor activities such as fishing and kayaking. We will continue to monitor her progress as she works towards meeting these goals. Patient has completed 10 sessions. Her 166.5 lbs gaining 1 lb since last 30 day review. Her last A1C was 6.2% 03/30/21. She is on Jardiance. She is doing well in the program with consistent attendance. She was evaluated by Dr. Rosemary Holms for recurring chest pain and SOB. She saw him on 3/23. He ordered labs and an ECHO which were all WNL and he did not feel she needed further ischemic work-up. She him again 4/12 and he decreased her  Metoprolol from 50-25 mg and increased Amlidipine to 5 mg daily. She saw Dr. Alla German 4/6 and he thourght her chest pain was musculoskeletal. She reported to him that she had increased energy and exercise tolerance with her daily activities. Her blood pressure is usually at goal in CR. Her personal goals for the program are to lost weight; decrease her SOB with exertion; be able to resume her outdoor activities such as fishing and kayaking. We will continue to monitor her progress as she works towards meeting these goals. Patient has completed 16 sessions with current weight of 166.4 lbs maintaining her weight in past 30 day review. She continues to do well in the program with progressions. Her attendance has been consistent. She has missed the past few sessions due to her daughter being in an accident while horseback riding. Patient's blood pressue is at goal. She denies anymore chest pain and her SOB is improving. Her personal goals for the program continue to be to lose weight, decrease her SOB with exertion and be able to resume her outdoor activities such as fishing and kayaking. We will continue to monitor her progress as she works towards meeting these goals.     Expected Outcomes Patient will complete the program meeting both personal and program goals. Patient will complete the program meeting both personal and program goals. Patient will complete the program meeting both personal and program goals.              Core Components/Risk Factors/Patient Goals at Discharge (Final Review):   Goals and Risk Factor Review - 08/02/21 0757       Core Components/Risk Factors/Patient Goals Review   Personal Goals Review Weight Management/Obesity;Other;Diabetes    Review Patient has completed 16 sessions with current weight of 166.4 lbs maintaining her weight in past 30 day review. She continues to do well in the program with progressions. Her attendance has been consistent. She has missed the past few  sessions due to her daughter being in an accident while horseback riding. Patient's blood pressue is at goal. She denies anymore chest pain and her SOB is improving. Her personal goals for the program continue to be to lose weight, decrease her SOB with exertion and be able to resume her outdoor activities such as fishing and kayaking. We will continue to monitor her progress as she works towards meeting these goals.  Expected Outcomes Patient will complete the program meeting both personal and program goals.             ITP Comments:  ITP Comments     Row Name 06/10/21 1326 06/17/21 1130         ITP Comments Patient saw Dr. Rosemary Holms today for evaluation of chest pain and SOB. 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 He ordered an ECHO scheduled for 3/28 and troponin levels. He wants to put CR on hold until she has her ECHO. Pending results, she may need further ischemic workup. We will wait for his ok for her to restart the program. Dr. Rosemary Holms sent a message stating patient's echo had improved and he did not think her recent chest pain was caused by ischemia and he did not feel she needed any further evaluation. He said she was good to return to cardiac rehab. He notified patient through [employer].               Comments: ITP REVIEW Pt is making expected progress toward Cardiac Rehab goals after completing 18 sessions. Recommend continued exercise, life style modification, education, and increased stamina and strength.

## 2021-08-13 ENCOUNTER — Encounter (HOSPITAL_COMMUNITY)
Admission: RE | Admit: 2021-08-13 | Discharge: 2021-08-13 | Disposition: A | Discharge status: Home / Self Care | Payer: Medicare Other | Source: Ambulatory Visit | Attending: Cardiology | Admitting: Cardiology

## 2021-08-13 ENCOUNTER — Encounter (HOSPITAL_COMMUNITY): Payer: Medicare Other

## 2021-08-13 DIAGNOSIS — Z951 Presence of aortocoronary bypass graft: Secondary | ICD-10-CM | POA: Diagnosis not present

## 2021-08-13 DIAGNOSIS — I213 ST elevation (STEMI) myocardial infarction of unspecified site: Secondary | ICD-10-CM

## 2021-08-13 NOTE — Progress Notes (Signed)
Daily Session Note  Patient Details  Name: Meredith Guerra MRN: [id number] Date of Birth: Feb 23, 1951 Referring Provider:   Flowsheet Row CARDIAC REHAB PHASE II ORIENTATION from 05/24/2021 in [place]  Referring Provider Dr. Darcey Nora       Encounter Date: 08/13/2021  Check In:  Session Check In - 08/13/21 1432       Check-In   Supervising physician immediately available to respond to emergencies [facility] MD immediately available    Physician(s) Dr Debara Pickett    Location [facility]    Staff Present Redge Gainer, BS, Exercise Physiologist;Cadence Haslam Hassell Done, RN, Bjorn Loser, MS, ACSM-CEP, Exercise Physiologist    Virtual Visit No    Medication changes reported     No    Fall or balance concerns reported    No    Tobacco Cessation No Change    Warm-up and Cool-down Performed as group-led instruction    Resistance Training Performed Yes    VAD Patient? No    PAD/SET Patient? No      Pain Assessment   Currently in Pain? No/denies    Multiple Pain Sites No             Capillary Blood Glucose: No results found for this or any previous visit (from the past 24 hour(s)).    Social History   Tobacco Use  Smoking Status Never  Smokeless Tobacco Never    Goals Met:  Independence with exercise equipment Exercise tolerated well No report of concerns or symptoms today Strength training completed today  Goals Unmet:  Not Applicable  Comments: Checkout at 1545.   Dr. Carlyle Dolly is Medical Director for [facility]

## 2021-08-16 ENCOUNTER — Encounter (HOSPITAL_COMMUNITY): Payer: Medicare Other

## 2021-08-18 ENCOUNTER — Encounter (HOSPITAL_COMMUNITY): Payer: Medicare Other

## 2021-08-20 ENCOUNTER — Encounter (HOSPITAL_COMMUNITY): Payer: Medicare Other

## 2021-08-23 ENCOUNTER — Encounter (HOSPITAL_COMMUNITY)
Admission: RE | Admit: 2021-08-23 | Discharge: 2021-08-23 | Disposition: A | Discharge status: Home / Self Care | Payer: Medicare Other | Source: Ambulatory Visit | Attending: Cardiology | Admitting: Cardiology

## 2021-08-23 DIAGNOSIS — Z951 Presence of aortocoronary bypass graft: Secondary | ICD-10-CM | POA: Diagnosis present

## 2021-08-23 DIAGNOSIS — I213 ST elevation (STEMI) myocardial infarction of unspecified site: Secondary | ICD-10-CM | POA: Insufficient documentation

## 2021-08-23 NOTE — Progress Notes (Signed)
Daily Session Note  Patient Details  Name: Meredith Guerra MRN: [id number] Date of Birth: 10-18-1950 Referring Provider:   Flowsheet Row CARDIAC REHAB PHASE II ORIENTATION from 05/24/2021 in [place]  Referring Provider Dr. Darcey Nora       Encounter Date: 08/23/2021  Check In:  Session Check In - 08/23/21 1445       Check-In   Supervising physician immediately available to respond to emergencies [facility] MD immediately available    Physician(s) Dr. Marlou Porch    Location [facility]    Staff Present Geanie Cooley, RN;Heather Otho Ket, BS, Exercise Physiologist;Dalton Kris Mouton, MS, ACSM-CEP, Exercise Physiologist    Virtual Visit No    Medication changes reported     No    Fall or balance concerns reported    No    Tobacco Cessation No Change    Warm-up and Cool-down Performed as group-led instruction    Resistance Training Performed Yes    VAD Patient? No    PAD/SET Patient? No      Pain Assessment   Currently in Pain? No/denies    Multiple Pain Sites No             Capillary Blood Glucose: No results found for this or any previous visit (from the past 24 hour(s)).    Social History   Tobacco Use  Smoking Status Never  Smokeless Tobacco Never    Goals Met:  Independence with exercise equipment Exercise tolerated well No report of concerns or symptoms today Strength training completed today  Goals Unmet:  Not Applicable  Comments: check out @ 3:45pm   Dr. Carlyle Dolly is Medical Director for [facility]

## 2021-08-25 ENCOUNTER — Encounter (HOSPITAL_COMMUNITY): Payer: Medicare Other

## 2021-08-26 ENCOUNTER — Other Ambulatory Visit: Payer: Self-pay | Admitting: Cardiology

## 2021-08-26 DIAGNOSIS — I2510 Atherosclerotic heart disease of native coronary artery without angina pectoris: Secondary | ICD-10-CM

## 2021-08-27 ENCOUNTER — Encounter (HOSPITAL_COMMUNITY): Payer: Medicare Other

## 2021-08-30 NOTE — Progress Notes (Signed)
Discharge Progress Report  Patient Details  Name: Meredith Guerra MRN: [id number] Date of Birth: 1950/08/23 Referring Provider:   Flowsheet Row CARDIAC REHAB PHASE II ORIENTATION from 05/24/2021 in [facility] CARDIAC REHABILITATION  Referring Provider Dr. Maren Beach        Number of Visits: 22  Reason for Discharge:  Early Exit:  Lack of attendance  Smoking History:  Social History   Tobacco Use  Smoking Status Never  Smokeless Tobacco Never    Diagnosis:  ST elevation myocardial infarction (STEMI), unspecified artery (HCC)  S/P CABG x 3  ADL UCSD:   Initial Exercise Prescription:  Initial Exercise Prescription - 05/24/21 1300       Date of Initial Exercise RX and Referring Provider   Date 05/24/21    Referring Provider Dr. Maren Beach    Expected Discharge Date 08/20/21      Treadmill   MPH 1.8    Grade 0    Minutes 17      NuStep   Level 1    SPM 60    Minutes 22      Prescription Details   Frequency (times per week) 3    Duration Progress to 30 minutes of continuous aerobic without signs/symptoms of physical distress      Intensity   THRR 40-80% of Max Heartrate 60-120    Ratings of Perceived Exertion 11-13      Progression   Progression Continue progressive overload as per policy without signs/symptoms or physical distress.      Resistance Training   Training Prescription Yes    Weight 3    Reps 10-15             Discharge Exercise Prescription (Final Exercise Prescription Changes):  Exercise Prescription Changes - 08/11/21 1500       Home Exercise Plan   Plans to continue exercise at Home (comment)    Frequency Add 2 additional days to program exercise sessions.    Initial Home Exercises Provided 08/11/21             Functional Capacity:  6 Minute Walk     Row Name 05/24/21 1356         6 Minute Walk   Phase Initial     Distance 1300 feet     Walk Time 6 minutes     # of Rest Breaks 0     MPH 2.46     METS 2.73     RPE  12     VO2 Peak 9.58     Symptoms No     Resting HR 66 bpm     Resting BP 112/72     Resting Oxygen Saturation  95 %     Exercise Oxygen Saturation  during 6 min walk 96 %     Max Ex. HR 89 bpm     Max Ex. BP 148/76     2 Minute Post BP 120/72              Psychological, QOL, Others - Outcomes: PHQ 2/9:    05/24/2021   12:59 PM  Depression screen PHQ 2/9  Decreased Interest 0  Down, Depressed, Hopeless 0  PHQ - 2 Score 0  Altered sleeping 0  Tired, decreased energy 1  Change in appetite 0  Feeling bad or failure about yourself  0  Trouble concentrating 2  Moving slowly or fidgety/restless 1  Suicidal thoughts 0  PHQ-9 Score 4  Difficult doing work/chores Not difficult  at all    Quality of Life:  Quality of Life - 05/24/21 1401       Quality of Life   Select Quality of Life      Quality of Life Scores   Health/Function Pre 23.7 %    Socioeconomic Pre 28.07 %    Psych/Spiritual Pre 26.57 %    Family Pre 28.8 %    GLOBAL Pre 25.94 %             Personal Goals: Goals established at orientation with interventions provided to work toward goal.  Personal Goals and Risk Factors at Admission - 05/24/21 1314       Core Components/Risk Factors/Patient Goals on Admission    Weight Management Yes;Weight Loss    Intervention Weight Management: Develop a combined nutrition and exercise program designed to reach desired caloric intake, while maintaining appropriate intake of nutrient and fiber, sodium and fats, and appropriate energy expenditure required for the weight goal.;Weight Management: Provide education and appropriate resources to help participant work on and attain dietary goals.;Weight Management/Obesity: Establish reasonable short term and long term weight goals.;Obesity: Provide education and appropriate resources to help participant work on and attain dietary goals.    Expected Outcomes Short Term: Continue to assess and modify interventions until short  term weight is achieved;Long Term: Adherence to nutrition and physical activity/exercise program aimed toward attainment of established weight goal;Weight Maintenance: Understanding of the daily nutrition guidelines, which includes 25-35% calories from fat, 7% or less cal from saturated fats, less than  cholesterol, less than 1.5gm of sodium, & 5 or more servings of fruits and vegetables daily;Weight Loss: Understanding of general recommendations for a balanced deficit meal plan, which promotes 1-2 lb weight loss per week and includes a negative energy balance of [phone] kcal/d;Understanding recommendations for meals to include 15-35% energy as protein, 25-35% energy from fat, 35-60% energy from carbohydrates, less than  of dietary cholesterol, 20-35 gm of total fiber daily;Understanding of distribution of calorie intake throughout the day with the consumption of 4-5 meals/snacks    Improve shortness of breath with ADL's Yes    Intervention Provide education, individualized exercise plan and daily activity instruction to help decrease symptoms of SOB with activities of daily living.    Expected Outcomes Short Term: Improve cardiorespiratory fitness to achieve a reduction of symptoms when performing ADLs;Long Term: Be able to perform more ADLs without symptoms or delay the onset of symptoms    Personal Goal Other Yes    Personal Goal Be able to resume outdoor activities such as fishing and kayaking.    Intervention Attend CR three times per week and begin a home exercise program.    Expected Outcomes Pt will be able to resume her previous outdoor activities without issues.              Personal Goals Discharge:  Goals and Risk Factor Review     Row Name 06/07/21 4098 07/05/21 0733 08/02/21 0757         Core Components/Risk Factors/Patient Goals Review   Personal Goals Review Weight Management/Obesity;Other;Diabetes Weight Management/Obesity;Other;Diabetes Weight  Management/Obesity;Other;Diabetes     Review Patient referred to CR with STEMI;S/P CABGx3. She has multiple risk factors for CAD and is participating in the program for risk modification. She has completed 5 sessions. Her initial wegith was 165.7 and her current weight is 165.0 lbs. Her last A1C was 6.2% 03/30/21. She is on Jardiance. Her personal goals for the program are to lost weight; decrease  her SOB with exertion; be able to resume her outdoor activities such as fishing and kayaking. We will continue to monitor her progress as she works towards meeting these goals. Patient has completed 10 sessions. Her 166.5 lbs gaining 1 lb since last 30 day review. Her last A1C was 6.2% 03/30/21. She is on Jardiance. She is doing well in the program with consistent attendance. She was evaluated by Dr. Rosemary Holms for recurring chest pain and SOB. She saw him on 3/23. He ordered labs and an ECHO which were all WNL and he did not feel she needed further ischemic work-up. She him again 4/12 and he decreased her Metoprolol from 50-25 mg and increased Amlidipine to 5 mg daily. She saw Dr. Alla German 4/6 and he thourght her chest pain was musculoskeletal. She reported to him that she had increased energy and exercise tolerance with her daily activities. Her blood pressure is usually at goal in CR. Her personal goals for the program are to lost weight; decrease her SOB with exertion; be able to resume her outdoor activities such as fishing and kayaking. We will continue to monitor her progress as she works towards meeting these goals. Patient has completed 16 sessions with current weight of 166.4 lbs maintaining her weight in past 30 day review. She continues to do well in the program with progressions. Her attendance has been consistent. She has missed the past few sessions due to her daughter being in an accident while horseback riding. Patient's blood pressue is at goal. She denies anymore chest pain and her SOB is improving. Her  personal goals for the program continue to be to lose weight, decrease her SOB with exertion and be able to resume her outdoor activities such as fishing and kayaking. We will continue to monitor her progress as she works towards meeting these goals.     Expected Outcomes Patient will complete the program meeting both personal and program goals. Patient will complete the program meeting both personal and program goals. Patient will complete the program meeting both personal and program goals.              Exercise Goals and Review:  Exercise Goals     Row Name 05/24/21 1359 06/09/21 1550 07/12/21 1530 08/10/21 0756       Exercise Goals   Increase Physical Activity Yes Yes Yes Yes    Intervention Provide advice, education, support and counseling about physical activity/exercise needs.;Develop an individualized exercise prescription for aerobic and resistive training based on initial evaluation findings, risk stratification, comorbidities and participant's personal goals. Provide advice, education, support and counseling about physical activity/exercise needs.;Develop an individualized exercise prescription for aerobic and resistive training based on initial evaluation findings, risk stratification, comorbidities and participant's personal goals. Provide advice, education, support and counseling about physical activity/exercise needs.;Develop an individualized exercise prescription for aerobic and resistive training based on initial evaluation findings, risk stratification, comorbidities and participant's personal goals. Provide advice, education, support and counseling about physical activity/exercise needs.;Develop an individualized exercise prescription for aerobic and resistive training based on initial evaluation findings, risk stratification, comorbidities and participant's personal goals.    Expected Outcomes Short Term: Attend rehab on a regular basis to increase amount of physical  activity.;Long Term: Add in home exercise to make exercise part of routine and to increase amount of physical activity.;Long Term: Exercising regularly at least 3-5 days a week. Short Term: Attend rehab on a regular basis to increase amount of physical activity.;Long Term: Add in home exercise to make  exercise part of routine and to increase amount of physical activity.;Long Term: Exercising regularly at least 3-5 days a week. Short Term: Attend rehab on a regular basis to increase amount of physical activity.;Long Term: Add in home exercise to make exercise part of routine and to increase amount of physical activity.;Long Term: Exercising regularly at least 3-5 days a week. Short Term: Attend rehab on a regular basis to increase amount of physical activity.;Long Term: Add in home exercise to make exercise part of routine and to increase amount of physical activity.;Long Term: Exercising regularly at least 3-5 days a week.    Increase Strength and Stamina Yes Yes Yes Yes    Intervention Provide advice, education, support and counseling about physical activity/exercise needs.;Develop an individualized exercise prescription for aerobic and resistive training based on initial evaluation findings, risk stratification, comorbidities and participant's personal goals. Provide advice, education, support and counseling about physical activity/exercise needs.;Develop an individualized exercise prescription for aerobic and resistive training based on initial evaluation findings, risk stratification, comorbidities and participant's personal goals. Provide advice, education, support and counseling about physical activity/exercise needs.;Develop an individualized exercise prescription for aerobic and resistive training based on initial evaluation findings, risk stratification, comorbidities and participant's personal goals. Provide advice, education, support and counseling about physical activity/exercise needs.;Develop an  individualized exercise prescription for aerobic and resistive training based on initial evaluation findings, risk stratification, comorbidities and participant's personal goals.    Expected Outcomes Short Term: Increase workloads from initial exercise prescription for resistance, speed, and METs.;Short Term: Perform resistance training exercises routinely during rehab and add in resistance training at home;Long Term: Improve cardiorespiratory fitness, muscular endurance and strength as measured by increased METs and functional capacity ( ) Short Term: Increase workloads from initial exercise prescription for resistance, speed, and METs.;Short Term: Perform resistance training exercises routinely during rehab and add in resistance training at home;Long Term: Improve cardiorespiratory fitness, muscular endurance and strength as measured by increased METs and functional capacity ( ) Short Term: Increase workloads from initial exercise prescription for resistance, speed, and METs.;Short Term: Perform resistance training exercises routinely during rehab and add in resistance training at home;Long Term: Improve cardiorespiratory fitness, muscular endurance and strength as measured by increased METs and functional capacity ( ) Short Term: Increase workloads from initial exercise prescription for resistance, speed, and METs.;Short Term: Perform resistance training exercises routinely during rehab and add in resistance training at home;Long Term: Improve cardiorespiratory fitness, muscular endurance and strength as measured by increased METs and functional capacity ( )    Able to understand and use rate of perceived exertion (RPE) scale Yes Yes Yes Yes    Intervention Provide education and explanation on how to use RPE scale Provide education and explanation on how to use RPE scale Provide education and explanation on how to use RPE scale Provide education and explanation on how to use RPE scale    Expected  Outcomes Short Term: Able to use RPE daily in rehab to express subjective intensity level;Long Term:  Able to use RPE to guide intensity level when exercising independently Short Term: Able to use RPE daily in rehab to express subjective intensity level;Long Term:  Able to use RPE to guide intensity level when exercising independently Short Term: Able to use RPE daily in rehab to express subjective intensity level;Long Term:  Able to use RPE to guide intensity level when exercising independently Short Term: Able to use RPE daily in rehab to express subjective intensity level;Long Term:  Able to use RPE to guide intensity level  when exercising independently    Knowledge and understanding of Target Heart Rate Range (THRR) Yes Yes Yes Yes    Intervention Provide education and explanation of THRR including how the numbers were predicted and where they are located for reference Provide education and explanation of THRR including how the numbers were predicted and where they are located for reference Provide education and explanation of THRR including how the numbers were predicted and where they are located for reference Provide education and explanation of THRR including how the numbers were predicted and where they are located for reference    Expected Outcomes Short Term: Able to state/look up THRR;Long Term: Able to use THRR to govern intensity when exercising independently;Short Term: Able to use daily as guideline for intensity in rehab Short Term: Able to state/look up THRR;Long Term: Able to use THRR to govern intensity when exercising independently;Short Term: Able to use daily as guideline for intensity in rehab Short Term: Able to state/look up THRR;Long Term: Able to use THRR to govern intensity when exercising independently;Short Term: Able to use daily as guideline for intensity in rehab Short Term: Able to state/look up THRR;Long Term: Able to use THRR to govern intensity when exercising  independently;Short Term: Able to use daily as guideline for intensity in rehab    Able to check pulse independently Yes Yes Yes Yes    Intervention Provide education and demonstration on how to check pulse in carotid and radial arteries.;Review the importance of being able to check your own pulse for safety during independent exercise Provide education and demonstration on how to check pulse in carotid and radial arteries.;Review the importance of being able to check your own pulse for safety during independent exercise Provide education and demonstration on how to check pulse in carotid and radial arteries.;Review the importance of being able to check your own pulse for safety during independent exercise Provide education and demonstration on how to check pulse in carotid and radial arteries.;Review the importance of being able to check your own pulse for safety during independent exercise    Expected Outcomes Short Term: Able to explain why pulse checking is important during independent exercise;Long Term: Able to check pulse independently and accurately Short Term: Able to explain why pulse checking is important during independent exercise;Long Term: Able to check pulse independently and accurately Short Term: Able to explain why pulse checking is important during independent exercise;Long Term: Able to check pulse independently and accurately Short Term: Able to explain why pulse checking is important during independent exercise;Long Term: Able to check pulse independently and accurately    Understanding of Exercise Prescription Yes Yes Yes Yes    Intervention Provide education, explanation, and written materials on patient's individual exercise prescription Provide education, explanation, and written materials on patient's individual exercise prescription Provide education, explanation, and written materials on patient's individual exercise prescription Provide education, explanation, and written  materials on patient's individual exercise prescription    Expected Outcomes Short Term: Able to explain program exercise prescription;Long Term: Able to explain home exercise prescription to exercise independently Short Term: Able to explain program exercise prescription;Long Term: Able to explain home exercise prescription to exercise independently Short Term: Able to explain program exercise prescription;Long Term: Able to explain home exercise prescription to exercise independently Short Term: Able to explain program exercise prescription;Long Term: Able to explain home exercise prescription to exercise independently             Exercise Goals Re-Evaluation:  Exercise Goals Re-Evaluation  Row Name 06/09/21 1550 07/12/21 1530 08/10/21 0757 08/10/21 0804       Exercise Goal Re-Evaluation   Exercise Goals Review Increase Physical Activity;Increase Strength and Stamina;Able to understand and use rate of perceived exertion (RPE) scale;Knowledge and understanding of Target Heart Rate Range (THRR);Able to check pulse independently;Understanding of Exercise Prescription Increase Physical Activity;Increase Strength and Stamina;Able to understand and use rate of perceived exertion (RPE) scale;Knowledge and understanding of Target Heart Rate Range (THRR);Able to check pulse independently;Understanding of Exercise Prescription Increase Physical Activity;Increase Strength and Stamina;Able to understand and use rate of perceived exertion (RPE) scale;Knowledge and understanding of Target Heart Rate Range (THRR);Able to check pulse independently;Understanding of Exercise Prescription Increase Physical Activity;Increase Strength and Stamina;Able to understand and use rate of perceived exertion (RPE) scale;Knowledge and understanding of Target Heart Rate Range (THRR);Able to check pulse independently;Understanding of Exercise Prescription    Comments Pt has completed 7 session of cardiac rehab. She has  recently had slight chest pain with exercise and she is following up with Dr. She is currently exercising at 2.53 METs on the treadmill. Will continue to monitor and progress as able. Pt has completed 11 sessions of cardiac rehab. She is no longer experiencing chest disconfort while exercising. She is currently exercising at 3.13 METs on the stepper. Will continue to monitor and progress as able. Pt has completed 18 sessions of cardiac rehab. She is motivated when she comes into class and progressing her workload. She complained about R ankle pain starting to hurt, she had broke this ankle years ago and havent given her much issues until recently. We adviced her tor change from treadmill to another machine that has less stress on the ankle. She is currently exercsing at Pt has completed 18 sessions of cardiac rehab. She is motivated when she comes into class and progressing her workload. She complained about R ankle pain starting to hurt, she had broke this ankle years ago and havent given her much issues until recently. We adviced her tor change from treadmill to another machine that has less stress on the ankle. She is currently exercsing at 2.86 METs on the stepper. Will continue to monitor and progress as able.    Expected Outcomes Through exercise at rehab and at home, the patient will meet their stated goals. Through exercise at rehab and at home, the patient will meet their stated goals. Through exercise at rehab and at home, the patient will meet their stated goals. Through exercise at rehab and at home, the patient will meet their stated goals.             Nutrition & Weight - Outcomes:  Pre Biometrics - 05/24/21 1359       Pre Biometrics   Height 5\' 3"  (1.6 m)    Weight 165 lb 5.5 oz (75 kg)    Waist Circumference 39.5 inches    Hip Circumference 39 inches    Waist to Hip Ratio 1.01 %    BMI (Calculated) 29.3    Triceps Skinfold 24 mm    % Body Fat 41 %    Grip Strength 21 kg     Flexibility 8 in    Single Leg Stand 5 seconds              Nutrition:  Nutrition Therapy & Goals - 06/07/21 0710       Personal Nutrition Goals   Comments Patient scored 15 on her diet assessment. We offer 2 educational sessions on heart healthy nutrition  with handouts and assistance with RD referral if patient is interested.      Intervention Plan   Intervention Nutrition handout(s) given to patient.    Expected Outcomes Short Term Goal: Understand basic principles of dietary content, such as calories, fat, sodium, cholesterol and nutrients.             Nutrition Discharge:  Nutrition Assessments - 05/24/21 1308       MEDFICTS Scores   Pre Score 15             Education Questionnaire Score:  Knowledge Questionnaire Score - 05/24/21 1311       Knowledge Questionnaire Score   Pre Score 22/24             Pt discharged from CR 08/27/2021 after 22 sessions. She was a no call/no show for her final assessment on that day.

## 2021-09-10 ENCOUNTER — Other Ambulatory Visit: Payer: Self-pay | Admitting: Cardiology

## 2021-09-10 DIAGNOSIS — R072 Precordial pain: Secondary | ICD-10-CM

## 2021-10-25 ENCOUNTER — Ambulatory Visit: Payer: Medicare Other | Admitting: Cardiology

## 2021-11-18 ENCOUNTER — Encounter: Payer: Self-pay | Admitting: Cardiology

## 2021-11-18 ENCOUNTER — Ambulatory Visit: Payer: Medicare Other | Admitting: Cardiology

## 2021-11-18 VITALS — BP 150/79 | HR 74 | Temp 98.0°F | Resp 16 | Ht 63.0 in | Wt 158.0 lb

## 2021-11-18 DIAGNOSIS — E782 Mixed hyperlipidemia: Secondary | ICD-10-CM

## 2021-11-18 DIAGNOSIS — I10 Essential (primary) hypertension: Secondary | ICD-10-CM

## 2021-11-18 DIAGNOSIS — I2510 Atherosclerotic heart disease of native coronary artery without angina pectoris: Secondary | ICD-10-CM

## 2021-11-18 MED ORDER — LOSARTAN POTASSIUM 25 MG PO TABS: 25.0000 mg | ORAL_TABLET | Freq: Every day | ORAL | 3 refills | Status: DC

## 2021-11-18 NOTE — Progress Notes (Signed)
Follow up visit  Subjective:   Meredith Guerra, female    DOB: [birth year], 71 y.o.   MRN: [id number]    HPI  Chief Complaint  Patient presents with   Coronary Artery Disease   Follow-up    71 y.o. Caucasian female  with hypertension, mixed hyperlipidemia, asthma, remote history of Takotsubo cardiomyopathy, s/p CABGX3 (LIMA-LAD, SVG-RI-SVG-OM) for STEMI presentation while recovering from [place] (03/2021), brief postop A. fib, now resolved.  Patient is doing well, denies chest pain, shortness of breath, palpitations, leg edema, orthopnea, PND, TIA/syncope. She admits to having not followed up on home blood pressure checks given health issues in family. Blood pressure is elevated today.     Current Outpatient Medications:    acetaminophen (TYLENOL) 500 MG tablet, Take 1,000 mg by mouth every 6 (six) hours as needed for mild pain, headache or fever., Disp: , Rfl:    albuterol (PROVENTIL) 2 MG tablet, Take 2 mg by mouth 3 (three) times daily as needed for wheezing or shortness of breath., Disp: , Rfl:    albuterol (VENTOLIN HFA) 108 (90 Base) MCG/ACT inhaler, Inhale 2 puffs into the lungs in the morning and at bedtime., Disp: , Rfl:    amiodarone (PACERONE) 200 MG tablet, Take 1 tablet (200 mg total) by mouth daily. TID for 7 days, then BID for 7 days, then once daily, Disp: 90 tablet, Rfl: 1   amLODipine (NORVASC) 5 MG tablet, Take 1 tablet (5 mg total) by mouth daily., Disp: 90 tablet, Rfl: 3   aspirin 81 MG EC tablet, Take 1 tablet (81 mg total) by mouth daily., Disp: 90 tablet, Rfl: 3   atorvastatin (LIPITOR) 80 MG tablet, Take 1 tablet (80 mg total) by mouth daily., Disp: 90 tablet, Rfl: 3   budesonide-formoterol (SYMBICORT) 160-4.5 MCG/ACT inhaler, Inhale 2 puffs into the lungs 2 (two) times daily., Disp: , Rfl:    butalbital-acetaminophen-caffeine (FIORICET) 50-325-40 MG tablet, Take 1 tablet by mouth 2 (two) times daily as needed for migraine., Disp: , Rfl:    Cholecalciferol  (VITAMIN D-3) 25 MCG (1000 UT) CAPS, Take 1,000 Units by mouth daily., Disp: , Rfl:    clonazePAM (KLONOPIN) 0.5 MG tablet, Take 0.5 mg by mouth daily as needed., Disp: , Rfl:    clopidogrel (PLAVIX) 75 MG tablet, Take 1 tablet (75 mg total) by mouth daily., Disp: 90 tablet, Rfl: 3   DULoxetine (CYMBALTA) 30 MG capsule, Take 30 mg by mouth daily., Disp: , Rfl:    DULoxetine (CYMBALTA) 60 MG capsule, Take 60 mg by mouth at bedtime., Disp: , Rfl:    JARDIANCE 10 MG TABS tablet, TAKE 1 TABLET BY MOUTH DAILY BEFORE BREAKFAST., Disp: 30 tablet, Rfl: 3   ketotifen (ZADITOR) 0.025 % ophthalmic solution, Place 1 drop into both eyes 2 (two) times daily as needed (allergies)., Disp: , Rfl:    levothyroxine (SYNTHROID) 25 MCG tablet, Take 37.5 mcg by mouth daily before breakfast., Disp: , Rfl:    loratadine (CLARITIN) 10 MG tablet, Take 10 mg by mouth daily., Disp: , Rfl:    losartan (COZAAR) 25 MG tablet, Take 0.5 tablets (12.5 mg total) by mouth daily., Disp: 60 tablet, Rfl: 3   metoprolol succinate (TOPROL-XL) 25 MG 24 hr tablet, Take 1 tablet (25 mg total) by mouth daily. Take with or immediately following a meal., Disp: 90 tablet, Rfl: 3   metoprolol succinate (TOPROL-XL) 50 MG 24 hr tablet, TAKE 1 TABLET BY MOUTH DAILY. TAKE WITH OR IMMEDIATELY FOLLOWING A MEAL.,  Disp: 90 tablet, Rfl: 1   montelukast (SINGULAIR) 10 MG tablet, Take 10 mg by mouth at bedtime., Disp: , Rfl:    nitroGLYCERIN (NITROSTAT) 0.4 MG SL tablet, Place 1 tablet (0.4 mg total) under the tongue every 5 (five) minutes as needed for chest pain., Disp: 30 tablet, Rfl: 1   ondansetron (ZOFRAN) 4 MG tablet, Take 4 mg by mouth every 8 (eight) hours as needed for nausea or vomiting., Disp: , Rfl:    pantoprazole (PROTONIX) 40 MG tablet, Take 80 mg by mouth daily., Disp: , Rfl:    promethazine (PHENERGAN) 12.5 MG tablet, Take 12.5 mg by mouth every 4 (four) hours as needed for nausea or vomiting., Disp: , Rfl:    Rimegepant Sulfate (NURTEC) 75  MG TBDP, Take 75 mg by mouth daily as needed (migraines)., Disp: , Rfl:    tiZANidine (ZANAFLEX) 4 MG tablet, Take 2-8 mg by mouth See admin instructions. Take 8 mg every night, may take 2 mg during the day as needed for migraines, Disp: , Rfl:    traZODone (DESYREL) 150 MG tablet, Take 150 mg by mouth at bedtime., Disp: , Rfl:     Cardiovascular & other pertient studies:  EKG 11/18/2021: Sinus rhythm 70 bpm  Right bundle branch block Left anterior fascicular block  Echocardiogram 06/15/2021:  Left ventricle cavity is normal in size and wall thickness. Abnormal  septal wall motion due to post-operative coronary artery bypass graft.  Normal LV systolic function with EF 56%. Doppler evidence of grade I  (impaired) diastolic dysfunction, normal LAP.  Mild tricuspid regurgitation.  No evidence of pulmonary hypertension.  Compared to previous study on 03/30/2021, wall motion abnormalities have  resolved. LVEF has improved from 30-35%.  EKG 06/10/2021: Sinus rhythm 67 bpm  Old anteroseptal infarct Diffuse nonspecific T-abnormality  Op note 04/06/2021 (Dr Prescott Gum): 1.  Coronary artery bypass grafting x3 (left internal mammary artery to LAD, saphenous vein graft to ramus intermedius, saphenous vein graft to circumflex marginal).  2.  Endoscopic harvest of right leg greater saphenous vein.  Cardiac MRI 03/31/2021 1.  No late gadolinium enhancement, suggesting myocardium is viable 2. Normal LV size with mild systolic dysfunction (EF 35%). Apical hypokinesis 3.  Small RV size with normal systolic function (EF 57%)  Coronary angiogram 03/29/2021: LM: 90% distal stenosis at the trifurcation LAD: Proximal focal 80% stenosis at bifurcation with large septal perforator branch, focal mid 60% stenosis Ramus intermedius: No significant disease Left circumflex: Mid 30% stenosis RCA: Moderate diffuse disease in small caliber RPDA   Aspirin Aggrastat for next 4 hours, then IV heparin IV  nitroglycerin BP control CVTS consult for CABG  Recent labs: 04/30/2021: Chol 158, TG 227, HDL 58, LDL 78  04/12/2021: Glucose 119, BUN/Cr 12/0.8. EGFR >60. Na/K 140/4.4 H/H 11/33. MCV 90. Platelets 350 HbA1C 6.6% Chol 327, TG 221, HDL 51, LDL 221     Review of Systems  Cardiovascular:  Negative for chest pain, dyspnea on exertion, leg swelling, palpitations and syncope.         Vitals:   11/18/21 1349  BP: (!) 150/79  Pulse: 74  Resp: 16  Temp: 98 F (36.7 C)  SpO2: 95%    Body mass index is 27.99 kg/m. Filed Weights   11/18/21 1349  Weight: 158 lb (71.7 kg)     Objective:   Physical Exam Vitals and nursing note reviewed.  Constitutional:      General: She is not in acute distress. Neck:  Vascular: No JVD.  Cardiovascular:     Rate and Rhythm: Normal rate and regular rhythm.     Pulses:          Dorsalis pedis pulses are 0 on the right side and 0 on the left side.       Posterior tibial pulses are 0 on the right side and 1+ on the left side.     Heart sounds: Normal heart sounds. No murmur heard. Pulmonary:     Effort: Pulmonary effort is normal.     Breath sounds: Normal breath sounds. No wheezing or rales.  Musculoskeletal:     Right lower leg: No edema.     Left lower leg: No edema.        ICD-10-CM   1. Coronary artery disease involving native coronary artery of native heart without angina pectoris  I25.10 EKG 12-Lead    losartan (COZAAR) 25 MG tablet    Lipid panel    2. Mixed hyperlipidemia  E78.2 Lipid panel    3. Primary hypertension  P23 Basic metabolic panel          Assessment & Recommendations:   71 y.o. Caucasian female  with hypertension, mixed hyperlipidemia, asthma, remote history of Takotsubo cardiomyopathy, s/p CABGX3 (LIMA-LAD, SVG-RI, SVG-OM) for STEMI presentation while recovering from [place] (03/2021), brief postop A. fib, now resolved.  Coronary artery disease: S/p CABGX3 (LIMA-LAD, SVG-RI-SVG-OM) (03/2021) No  recurrent chest pain EF has normalized Continue aspirin and Plavix for 1 year (till 03/2022) given ACS presentation prior to CABG. Continue Lipitor 80 mg daily.  LDL is down from 232 to 78.  Continue Jardiance 10 mg daily.  See below re: hypertension management.  Hypertension: Not controlled on metoprolol succinate 50 mg daily, losartan 12.5 mg daily, amlodipine to 5 mg daily. Increase losartan to 25 mg daily. Check BMP in 1-2 weeks  Brief post op Afib: No recurrence. Off amiodarone. Hold off anticoagulation as recurrence of A-fib   Mixed hyperlipidemia: Check lipid panel  F/u in 6-8 weeks   Nigel Mormon, MD Pager: [phone] Office: [phone]

## 2021-12-02 LAB — LIPID PANEL
Chol/HDL Ratio: 3.1 ratio (ref 0.0–4.4)
Cholesterol, Total: 185 mg/dL (ref 100–199)
HDL: 60 mg/dL (ref >39)
LDL Chol Calc (NIH): 98 mg/dL (ref 0–99)
Triglycerides: 159 mg/dL — ABNORMAL HIGH (ref 0–149)
VLDL Cholesterol Cal: 27 mg/dL (ref 5–40)

## 2021-12-02 LAB — BASIC METABOLIC PANEL
BUN/Creatinine Ratio: 13 (ref 12–28)
BUN: 13 mg/dL (ref 8–27)
CO2: 26 mmol/L (ref 20–29)
Calcium: 10.3 mg/dL (ref 8.7–10.3)
Chloride: 103 mmol/L (ref 96–106)
Creatinine, Ser: 1.02 mg/dL — ABNORMAL HIGH (ref 0.57–1.00)
Glucose: 106 mg/dL — ABNORMAL HIGH (ref 70–99)
Potassium: 4.4 mmol/L (ref 3.5–5.2)
Sodium: 146 mmol/L — ABNORMAL HIGH (ref 134–144)
eGFR: 59 mL/min/{1.73_m2} — ABNORMAL LOW (ref >59)

## 2021-12-03 NOTE — Addendum Note (Signed)
Addended by: Elder Negus on: 12/03/2021 02:00 PM   Modules accepted: Orders

## 2021-12-03 NOTE — Progress Notes (Signed)
LDL not at goal. Recommend Leqvio every 6 months. Lipid panel 3 months after first injection.  Thanks MJP

## 2021-12-16 ENCOUNTER — Other Ambulatory Visit: Payer: Self-pay

## 2021-12-20 ENCOUNTER — Telehealth: Payer: Self-pay | Admitting: Pharmacy Technician

## 2021-12-20 NOTE — Telephone Encounter (Addendum)
Auth Submission: NO AUTH NEEDED Payer: medicare [employer] [place] Medication & CPT/J Code(s) submitted: Leqvio (Inclisiran) [phone] Route of submission (phone, fax, portal):  Phone # Fax # Auth type: Buy/Bill Units/visits requested: 284mg  Reference number: [id number] Approval from: 12/20/21 to 03/21/23  Medicare and supplement coverage reviewed and coverage extended to 03/21/23

## 2021-12-25 ENCOUNTER — Other Ambulatory Visit: Payer: Self-pay | Admitting: Cardiology

## 2021-12-25 DIAGNOSIS — I2510 Atherosclerotic heart disease of native coronary artery without angina pectoris: Secondary | ICD-10-CM

## 2021-12-28 ENCOUNTER — Ambulatory Visit (INDEPENDENT_AMBULATORY_CARE_PROVIDER_SITE_OTHER): Payer: Medicare Other

## 2021-12-28 VITALS — BP 128/76 | HR 64 | Temp 98.0°F | Resp 18 | Ht 63.0 in | Wt 159.6 lb

## 2021-12-28 DIAGNOSIS — E782 Mixed hyperlipidemia: Secondary | ICD-10-CM | POA: Diagnosis not present

## 2021-12-28 MED ORDER — INCLISIRAN SODIUM 284 MG/1.5ML ~~LOC~~ SOSY
284.0000 mg | PREFILLED_SYRINGE | Freq: Once | SUBCUTANEOUS | Status: AC
  Administered 2021-12-28: 284 mg via SUBCUTANEOUS
  Filled 2021-12-28: qty 1.5

## 2021-12-28 NOTE — Progress Notes (Signed)
Diagnosis: Hyperlipidemia  Provider:  Marshell Garfinkel MD  Procedure: Injection  Leqvio (inclisiran), Dose: 284 mg, Site: subcutaneous, Number of injections: 1 30 Minutes observation completed.  Discharge: Condition: Good, Destination: Home . AVS provided to patient.   Performed by:  Arnoldo Morale, RN

## 2022-01-13 ENCOUNTER — Ambulatory Visit: Payer: Medicare Other | Admitting: Cardiology

## 2022-01-13 ENCOUNTER — Encounter: Payer: Self-pay | Admitting: Cardiology

## 2022-01-13 VITALS — BP 122/77 | HR 70 | Ht 63.0 in | Wt 155.4 lb

## 2022-01-13 DIAGNOSIS — E7879 Other disorders of bile acid and cholesterol metabolism: Secondary | ICD-10-CM

## 2022-01-13 DIAGNOSIS — I2510 Atherosclerotic heart disease of native coronary artery without angina pectoris: Secondary | ICD-10-CM

## 2022-01-13 DIAGNOSIS — I10 Essential (primary) hypertension: Secondary | ICD-10-CM

## 2022-01-13 MED ORDER — EMPAGLIFLOZIN 10 MG PO TABS: ORAL_TABLET | ORAL | 1 refills | Status: DC

## 2022-01-13 NOTE — Progress Notes (Signed)
Follow up visit  Subjective:   Meredith Guerra, female    DOB: 1950/10/07, 71 y.o.   MRN: [id number]    HPI  Chief Complaint  Patient presents with   Hypertension   Follow-up    71 y.o. 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  Patient is doing well, denies chest pain, shortness of breath, palpitations, leg edema, orthopnea, PND, TIA/syncope.  She is tolerating all her medications well.    Current Outpatient Medications:    acetaminophen (TYLENOL) 500 MG tablet, Take 1,000 mg by mouth every 6 (six) hours as needed for mild pain, headache or fever., Disp: , Rfl:    albuterol (PROVENTIL) 2 MG tablet, Take 2 mg by mouth 3 (three) times daily as needed for wheezing or shortness of breath., Disp: , Rfl:    albuterol (VENTOLIN HFA) 108 (90 Base) MCG/ACT inhaler, Inhale 2 puffs into the lungs in the morning and at bedtime., Disp: , Rfl:    amLODipine (NORVASC) 5 MG tablet, Take 1 tablet (5 mg total) by mouth daily., Disp: 90 tablet, Rfl: 3   aspirin 81 MG EC tablet, Take 1 tablet (81 mg total) by mouth daily., Disp: 90 tablet, Rfl: 3   atorvastatin (LIPITOR) 80 MG tablet, Take 1 tablet (80 mg total) by mouth daily., Disp: 90 tablet, Rfl: 3   budesonide-formoterol (SYMBICORT) 160-4.5 MCG/ACT inhaler, Inhale 2 puffs into the lungs 2 (two) times daily., Disp: , Rfl:    butalbital-acetaminophen-caffeine (FIORICET) 50-325-40 MG tablet, Take 1 tablet by mouth 2 (two) times daily as needed for migraine., Disp: , Rfl:    Cholecalciferol (VITAMIN D-3) 25 MCG (1000 UT) CAPS, Take 1,000 Units by mouth daily., Disp: , Rfl:    clonazePAM (KLONOPIN) 0.5 MG tablet, Take 0.5 mg by mouth daily as needed.,  Disp: , Rfl:    clopidogrel (PLAVIX) 75 MG tablet, Take 1 tablet (75 mg total) by mouth daily., Disp: 90 tablet, Rfl: 3   DULoxetine (CYMBALTA) 30 MG capsule, Take 30 mg by mouth daily., Disp: , Rfl:    DULoxetine (CYMBALTA) 60 MG capsule, Take 60 mg by mouth at bedtime., Disp: , Rfl:    ketotifen (ZADITOR) 0.025 % ophthalmic solution, Place 1 drop into both eyes 2 (two) times daily as needed (allergies)., Disp: , Rfl:    levothyroxine (SYNTHROID) 25 MCG tablet, Take 37.5 mcg by mouth daily before breakfast., Disp: , Rfl:    loratadine (CLARITIN) 10 MG tablet, Take 10 mg by mouth daily., Disp: , Rfl:    losartan (COZAAR) 25 MG tablet, Take 1 tablet (25 mg total) by mouth daily., Disp: 90 tablet, Rfl: 3   metoprolol succinate (TOPROL-XL) 50 MG 24 hr tablet, TAKE 1 TABLET BY MOUTH DAILY. TAKE WITH OR IMMEDIATELY FOLLOWING A MEAL., Disp: 90 tablet, Rfl: 1   montelukast (SINGULAIR) 10 MG tablet, Take 10 mg by mouth at bedtime., Disp: , Rfl:    nitroGLYCERIN (NITROSTAT) 0.4 MG SL tablet, Place 1 tablet (0.4 mg total) under the tongue every 5 (five) minutes as needed for chest pain., Disp: 30 tablet, Rfl: 1   ondansetron (ZOFRAN) 4 MG tablet, Take 4 mg by mouth every 8 (eight) hours as needed for nausea or vomiting., Disp: , Rfl:    pantoprazole (PROTONIX) 40 MG tablet,  Take 80 mg by mouth daily., Disp: , Rfl:    promethazine (PHENERGAN) 12.5 MG tablet, Take 12.5 mg by mouth every 4 (four) hours as needed for nausea or vomiting., Disp: , Rfl:    Rimegepant Sulfate (NURTEC) 75 MG TBDP, Take 75 mg by mouth daily as needed (migraines)., Disp: , Rfl:    tiZANidine (ZANAFLEX) 4 MG tablet, Take 2-8 mg by mouth See admin instructions. Take 8 mg every night, may take 2 mg during the day as needed for migraines, Disp: , Rfl:    traZODone (DESYREL) 150 MG tablet, Take 150 mg by mouth at bedtime., Disp: , Rfl:    empagliflozin (JARDIANCE) 10 MG TABS tablet, TAKE 1 TABLET BY MOUTH EVERY DAY BEFORE BREAKFAST, Disp: 90  tablet, Rfl: 1    Cardiovascular & other pertient studies:  EKG 11/18/2021: Sinus rhythm 70 bpm  Right bundle branch block Left anterior fascicular block  Echocardiogram 06/15/2021:  Left ventricle cavity is normal in size and wall thickness. Abnormal  septal wall motion due to post-operative coronary artery bypass graft.  Normal LV systolic function with EF 56%. Doppler evidence of grade I  (impaired) diastolic dysfunction, normal LAP.  Mild tricuspid regurgitation.  No evidence of pulmonary hypertension.  Compared to previous study on 03/30/2021, wall motion abnormalities have  resolved. LVEF has improved from 30-35%.  EKG 06/10/2021: Sinus rhythm 67 bpm  Old anteroseptal infarct Diffuse nonspecific T-abnormality  Op note 04/06/2021 (Dr Prescott Gum): 1.  Coronary artery bypass grafting x3 (left internal mammary artery to LAD, saphenous vein graft to ramus intermedius, saphenous vein graft to circumflex marginal).  2.  Endoscopic harvest of right leg greater saphenous vein.  Cardiac MRI 03/31/2021 1.  No late gadolinium enhancement, suggesting myocardium is viable 2. Normal LV size with mild systolic dysfunction (EF 34%). Apical hypokinesis 3.  Small RV size with normal systolic function (EF 19%)  Coronary angiogram 03/29/2021: LM: 90% distal stenosis at the trifurcation LAD: Proximal focal 80% stenosis at bifurcation with large septal perforator branch, focal mid 60% stenosis Ramus intermedius: No significant disease Left circumflex: Mid 30% stenosis RCA: Moderate diffuse disease in small caliber RPDA    Rec: CABG 04/06/2021 (Dr Prescott Gum): CABG x3 (LIMA to LAD, SVG to RI, SVG to CX.    Recent labs: 12/01/2021: Glucose 106, BUN/Cr 13/1.02. EGFR 59. Na/K 146/4.4.  Chol 185, TG 159, HDL 60, LDL 98  04/30/2021: Chol 158, TG 227, HDL 58, LDL 78  04/12/2021: Glucose 119, BUN/Cr 12/0.8. EGFR >60. Na/K 140/4.4 H/H 11/33. MCV 90. Platelets 350 HbA1C 6.6% Chol 327, TG 221, HDL  51, LDL 221  Review of Systems  Cardiovascular:  Negative for chest pain, dyspnea on exertion, leg swelling, palpitations and syncope.    Vitals:   01/13/22 1250  BP: 122/77  Pulse: 70  SpO2: 96%    Body mass index is 27.53 kg/m. Filed Weights   01/13/22 1250  Weight: 155 lb 6.4 oz (70.5 kg)    Objective:   Physical Exam Neck:     Vascular: No carotid bruit or JVD.  Cardiovascular:     Rate and Rhythm: Normal rate and regular rhythm.     Pulses: Intact distal pulses.     Heart sounds: Normal heart sounds. No murmur heard.    No gallop.  Pulmonary:     Effort: Pulmonary effort is normal.     Breath sounds: Normal breath sounds.  Abdominal:     General: Bowel sounds are normal.  Palpations: Abdomen is soft.  Musculoskeletal:     Right lower leg: No edema.     Left lower leg: No edema.      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 Primary hypertension  I10     3. 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  Physical examination is unremarkable, there is no bruit, vascular examination is normal.  No clinical evidence of heart failure.  Coronary artery disease: S/p CABGX3 (LIMA-LAD, SVG-RI-SVG-OM) (03/2021) No recurrent chest pain EF has normalized Continue aspirin and Plavix for 1 year (till 03/2022) given ACS presentation prior to CABG. Continue Leqvio started 12/28/2021 Next lipid panel in 04/2022. Continue Jardiance 10 mg daily.  See below re:  hypertension management.  Primary Hypertension: On metoprolol succinate 50 mg daily, losartan 25 mg daily, amlodipine 5 mg daily. On her last office visit losartan was increased from 12.5 to 25 mg daily, labs are remained stable, blood pressure is very well controlled.  Continue present dose.  She remains asymptomatic.  Brief post op Afib: No recurrence. Off amiodarone. Hold off anticoagulation as recurrence of A-fib   Mixed hyperlipidemia: Suspect familial hypercholesterolemia in view of strong family history of hypercholesterolemia, LDL >200 at baseline in a patient who was just overweight but not obese and not a diabetic.  She is presently on Leqvio, continue the same.  She will obtain lipid profile testing sometime in February or March, we will see her back in 6 months for follow-up.  Patient will call us and inform us if lipid profile is not done prior to her next office visit, she will request for lab input so we can follow-up on this.  25-minute office visit encounter.    Adrian Prows, MD, [facility] 01/13/2022, 1:42 PM Office: [phone] Fax: [phone] Pager: [phone]

## 2022-03-30 ENCOUNTER — Ambulatory Visit (INDEPENDENT_AMBULATORY_CARE_PROVIDER_SITE_OTHER): Payer: Medicare Other | Admitting: *Deleted

## 2022-03-30 VITALS — BP 128/78 | HR 69 | Temp 98.1°F | Resp 18 | Ht 63.0 in | Wt 153.2 lb

## 2022-03-30 DIAGNOSIS — E782 Mixed hyperlipidemia: Secondary | ICD-10-CM | POA: Diagnosis not present

## 2022-03-30 MED ORDER — INCLISIRAN SODIUM 284 MG/1.5ML ~~LOC~~ SOSY
284.0000 mg | PREFILLED_SYRINGE | Freq: Once | SUBCUTANEOUS | Status: AC
  Administered 2022-03-30: 284 mg via SUBCUTANEOUS
  Filled 2022-03-30: qty 1.5

## 2022-03-30 NOTE — Progress Notes (Signed)
Diagnosis: Hyperlipidemia  Provider:  Praveen Mannam MD  Procedure: Injection  Leqvio (inclisiran), Dose: 284 mg, Site: subcutaneous, Number of injections: 1  Post Care: Observation period completed  Discharge: Condition: Good, Destination: Home . AVS provided to patient.   Performed by:  Avis Tirone A, RN       

## 2022-04-21 ENCOUNTER — Other Ambulatory Visit: Payer: Self-pay | Admitting: Cardiology

## 2022-04-30 ENCOUNTER — Other Ambulatory Visit: Payer: Self-pay | Admitting: Cardiology

## 2022-04-30 DIAGNOSIS — R072 Precordial pain: Secondary | ICD-10-CM

## 2022-05-03 ENCOUNTER — Other Ambulatory Visit: Payer: Self-pay | Admitting: Cardiology

## 2022-05-03 DIAGNOSIS — I2510 Atherosclerotic heart disease of native coronary artery without angina pectoris: Secondary | ICD-10-CM

## 2022-07-01 ENCOUNTER — Other Ambulatory Visit: Payer: Self-pay | Admitting: Cardiology

## 2022-07-15 ENCOUNTER — Encounter: Payer: Self-pay | Admitting: Cardiology

## 2022-07-15 ENCOUNTER — Ambulatory Visit: Payer: Medicare Other | Admitting: Cardiology

## 2022-07-15 VITALS — BP 137/83 | HR 63 | Resp 16 | Ht 63.0 in | Wt 157.0 lb

## 2022-07-15 DIAGNOSIS — I2510 Atherosclerotic heart disease of native coronary artery without angina pectoris: Secondary | ICD-10-CM

## 2022-07-15 DIAGNOSIS — I10 Essential (primary) hypertension: Secondary | ICD-10-CM

## 2022-07-15 DIAGNOSIS — E7879 Other disorders of bile acid and cholesterol metabolism: Secondary | ICD-10-CM

## 2022-07-15 NOTE — Progress Notes (Signed)
Follow up visit  Subjective:   Meredith Guerra, female    DOB: Nov 13, 1950, 72 y.o.   MRN: [id number]    HPI  Chief Complaint  Patient presents with   Coronary Artery Disease   Follow-up    6 month    72 y.o. Caucasian female  with hypertension, mixed hyperlipidemia, asthma, remote history of Takotsubo cardiomyopathy, s/p CABGX3 (LIMA-LAD, SVG-RI-SVG-OM) for STEMI presentation while recovering from COVID (03/2021), brief postop A. fib, now resolved.  Patient is doing well. She is tolerating Leqvio injections well. She has completed >1 year DAPT since her STEMI and CABG.    Current Outpatient Medications:    acetaminophen (TYLENOL) 500 MG tablet, Take 1,000 mg by mouth every 6 (six) hours as needed for mild pain, headache or fever., Disp: , Rfl:    albuterol (PROVENTIL) 2 MG tablet, Take 2 mg by mouth 3 (three) times daily as needed for wheezing or shortness of breath., Disp: , Rfl:    albuterol (VENTOLIN HFA) 108 (90 Base) MCG/ACT inhaler, Inhale 2 puffs into the lungs in the morning and at bedtime., Disp: , Rfl:    amLODipine (NORVASC) 5 MG tablet, Take 1 tablet (5 mg total) by mouth daily., Disp: 90 tablet, Rfl: 3   ASPIRIN LOW DOSE 81 MG tablet, TAKE 1 TABLET BY MOUTH EVERY DAY, Disp: 90 tablet, Rfl: 3   atorvastatin (LIPITOR) 80 MG tablet, TAKE 1 TABLET BY MOUTH EVERY DAY, Disp: 90 tablet, Rfl: 3   budesonide-formoterol (SYMBICORT) 160-4.5 MCG/ACT inhaler, Inhale 2 puffs into the lungs 2 (two) times daily., Disp: , Rfl:    butalbital-acetaminophen-caffeine (FIORICET) 50-325-40 MG tablet, Take 1 tablet by mouth 2 (two) times daily as needed for migraine., Disp: , Rfl:    Cholecalciferol (VITAMIN D-3) 25 MCG (1000 UT) CAPS, Take 1,000 Units by mouth daily., Disp: , Rfl:    clonazePAM (KLONOPIN) 0.5 MG tablet, Take 0.5 mg by mouth daily as needed., Disp: , Rfl:    clopidogrel (PLAVIX) 75 MG tablet, TAKE 1 TABLET BY MOUTH EVERY DAY, Disp: 90 tablet, Rfl: 3   DULoxetine (CYMBALTA) 30  MG capsule, Take 30 mg by mouth daily., Disp: , Rfl:    DULoxetine (CYMBALTA) 60 MG capsule, Take 60 mg by mouth at bedtime., Disp: , Rfl:    empagliflozin (JARDIANCE) 10 MG TABS tablet, TAKE 1 TABLET BY MOUTH EVERY DAY BEFORE BREAKFAST, Disp: 90 tablet, Rfl: 1   ketotifen (ZADITOR) 0.025 % ophthalmic solution, Place 1 drop into both eyes 2 (two) times daily as needed (allergies)., Disp: , Rfl:    levothyroxine (SYNTHROID) 25 MCG tablet, Take 37.5 mcg by mouth daily before breakfast., Disp: , Rfl:    loratadine (CLARITIN) 10 MG tablet, Take 10 mg by mouth daily., Disp: , Rfl:    losartan (COZAAR) 25 MG tablet, TAKE 1/2 TABLET BY MOUTH EVERY DAY, Disp: 45 tablet, Rfl: 5   metoprolol succinate (TOPROL-XL) 50 MG 24 hr tablet, TAKE 1 TABLET BY MOUTH EVERY DAY WITH OR IMMEDIATELY FOLLOWING A MEAL, Disp: 90 tablet, Rfl: 1   montelukast (SINGULAIR) 10 MG tablet, Take 10 mg by mouth at bedtime., Disp: , Rfl:    nitroGLYCERIN (NITROSTAT) 0.4 MG SL tablet, Place 1 tablet (0.4 mg total) under the tongue every 5 (five) minutes as needed for chest pain., Disp: 30 tablet, Rfl: 1   ondansetron (ZOFRAN) 4 MG tablet, Take 4 mg by mouth every 8 (eight) hours as needed for nausea or vomiting., Disp: , Rfl:  pantoprazole (PROTONIX) 40 MG tablet, Take 80 mg by mouth daily., Disp: , Rfl:    promethazine (PHENERGAN) 12.5 MG tablet, Take 12.5 mg by mouth every 4 (four) hours as needed for nausea or vomiting., Disp: , Rfl:    Rimegepant Sulfate (NURTEC) 75 MG TBDP, Take 75 mg by mouth daily as needed (migraines)., Disp: , Rfl:    tiZANidine (ZANAFLEX) 4 MG tablet, Take 2-8 mg by mouth See admin instructions. Take 8 mg every night, may take 2 mg during the day as needed for migraines, Disp: , Rfl:    traZODone (DESYREL) 150 MG tablet, Take 150 mg by mouth at bedtime., Disp: , Rfl:     Cardiovascular & other pertient studies:  EKG 07/15/2022: Sinus rhythm Right bundle branch block  Echocardiogram 06/15/2021:  Left  ventricle cavity is normal in size and wall thickness. Abnormal  septal wall motion due to post-operative coronary artery bypass graft.  Normal LV systolic function with EF 56%. Doppler evidence of grade I  (impaired) diastolic dysfunction, normal LAP.  Mild tricuspid regurgitation.  No evidence of pulmonary hypertension.  Compared to previous study on 03/30/2021, wall motion abnormalities have  resolved. LVEF has improved from 30-35%.  EKG 06/10/2021: Sinus rhythm 67 bpm  Old anteroseptal infarct Diffuse nonspecific T-abnormality  Op note 04/06/2021 (Dr Donata Clay): 1.  Coronary artery bypass grafting x3 (left internal mammary artery to LAD, saphenous vein graft to ramus intermedius, saphenous vein graft to circumflex marginal).  2.  Endoscopic harvest of right leg greater saphenous vein.  Cardiac MRI 03/31/2021 1.  No late gadolinium enhancement, suggesting myocardium is viable 2. Normal LV size with mild systolic dysfunction (EF 50%). Apical hypokinesis 3.  Small RV size with normal systolic function (EF 66%)  Coronary angiogram 03/29/2021: LM: 90% distal stenosis at the trifurcation LAD: Proximal focal 80% stenosis at bifurcation with large septal perforator branch, focal mid 60% stenosis Ramus intermedius: No significant disease Left circumflex: Mid 30% stenosis RCA: Moderate diffuse disease in small caliber RPDA   Aspirin Aggrastat for next 4 hours, then IV heparin IV nitroglycerin BP control CVTS consult for CABG  Recent labs: 12/01/2021: Glucose 106, BUN/Cr 13/1.02. EGFR 59. Na/K 146/4.4.  Chol 185, TG 159, HDL 60, LDL 98  04/30/2021: Chol 158, TG 227, HDL 58, LDL 78  04/12/2021: Glucose 119, BUN/Cr 12/0.8. EGFR >60. Na/K 140/4.4 H/H 11/33. MCV 90. Platelets 350 HbA1C 6.6% Chol 327, TG 221, HDL 51, LDL 221     Review of Systems  Cardiovascular:  Negative for chest pain, dyspnea on exertion, leg swelling, palpitations and syncope.         Vitals:   07/15/22  1240  BP: 137/83  Pulse: 63  Resp: 16  SpO2: 98%    Body mass index is 27.81 kg/m. Filed Weights   07/15/22 1240  Weight: 157 lb (71.2 kg)     Objective:   Physical Exam Vitals and nursing note reviewed.  Constitutional:      General: She is not in acute distress. Neck:     Vascular: No JVD.  Cardiovascular:     Rate and Rhythm: Normal rate and regular rhythm.     Pulses:          Dorsalis pedis pulses are 0 on the right side and 0 on the left side.       Posterior tibial pulses are 0 on the right side and 1+ on the left side.     Heart sounds: Normal heart sounds. No  murmur heard. Pulmonary:     Effort: Pulmonary effort is normal.     Breath sounds: Normal breath sounds. No wheezing or rales.  Musculoskeletal:     Right lower leg: No edema.     Left lower leg: No edema.    Visit diagnoses:    ICD-10-CM   1. Coronary artery disease involving native coronary artery of native heart without angina pectoris  I25.10 EKG 12-Lead    Lipid panel    Lipid panel    2. Primary hypertension  I10     3. Familial hypercholanemia  E78.79           Assessment & Recommendations:   72 y.o. Caucasian female  with hypertension, mixed hyperlipidemia, asthma, remote history of Takotsubo cardiomyopathy, s/p CABGX3 (LIMA-LAD, SVG-RI, SVG-OM) for STEMI presentation while recovering from COVID (03/2021), brief postop A. fib, now resolved.  Coronary artery disease: S/p CABGX3 (LIMA-LAD, SVG-RI-SVG-OM) (03/2021) No recurrent chest pain EF has normalized. Completed 1 year DAPT since ACS andd CABG (03/2022). Stop Plavix. Continue Aspirin 81 mg daily.  Continue Lipitor 80 mg daily, Leqvio. Check lipid panel in July 2024 after next Leqvio injection. Continue Jardiance 10 mg daily.  See below re: hypertension management.  Hypertension: Well controlled.  Brief post op Afib: No recurrence. Off amiodarone. Hold off anticoagulation as recurrence of A-fib   Mixed hyperlipidemia: As  above.  F/u in 6 months   Elder Negus, MD Pager: [phone] Office: [phone]

## 2022-07-16 ENCOUNTER — Encounter: Payer: Self-pay | Admitting: Cardiology

## 2022-07-16 DIAGNOSIS — E7879 Other disorders of bile acid and cholesterol metabolism: Secondary | ICD-10-CM | POA: Insufficient documentation

## 2022-08-28 ENCOUNTER — Other Ambulatory Visit: Payer: Self-pay | Admitting: Cardiology

## 2022-08-28 DIAGNOSIS — I2510 Atherosclerotic heart disease of native coronary artery without angina pectoris: Secondary | ICD-10-CM

## 2022-09-29 ENCOUNTER — Encounter: Payer: Self-pay | Admitting: Pulmonary Disease

## 2022-09-29 ENCOUNTER — Ambulatory Visit (INDEPENDENT_AMBULATORY_CARE_PROVIDER_SITE_OTHER): Payer: Medicare Other

## 2022-09-29 VITALS — BP 127/79 | HR 68 | Temp 97.5°F | Resp 18 | Ht 63.0 in | Wt 155.0 lb

## 2022-09-29 DIAGNOSIS — E782 Mixed hyperlipidemia: Secondary | ICD-10-CM

## 2022-09-29 MED ORDER — INCLISIRAN SODIUM 284 MG/1.5ML ~~LOC~~ SOSY
284.0000 mg | PREFILLED_SYRINGE | Freq: Once | SUBCUTANEOUS | Status: AC
  Administered 2022-09-29: 284 mg via SUBCUTANEOUS
  Filled 2022-09-29: qty 1.5

## 2022-09-29 NOTE — Progress Notes (Signed)
Diagnosis: Hyperlipidemia  Provider:  Mannam, Praveen MD  Procedure: Injection  Leqvio (inclisiran), Dose: 284 mg, Site: subcutaneous, Number of injections: 1  Post Care:     Discharge: Condition: Good, Destination: Home . AVS Declined  Performed by:  Eliette Drumwright, RN       

## 2022-10-05 ENCOUNTER — Encounter: Payer: Self-pay | Admitting: Cardiology

## 2022-10-05 ENCOUNTER — Ambulatory Visit: Payer: Medicare Other | Admitting: Cardiology

## 2022-10-05 VITALS — BP 119/77 | HR 66 | Resp 16 | Ht 63.0 in | Wt 156.4 lb

## 2022-10-05 DIAGNOSIS — I10 Essential (primary) hypertension: Secondary | ICD-10-CM

## 2022-10-05 DIAGNOSIS — R072 Precordial pain: Secondary | ICD-10-CM

## 2022-10-05 DIAGNOSIS — E782 Mixed hyperlipidemia: Secondary | ICD-10-CM

## 2022-10-05 DIAGNOSIS — I2510 Atherosclerotic heart disease of native coronary artery without angina pectoris: Secondary | ICD-10-CM

## 2022-10-05 MED ORDER — ATORVASTATIN CALCIUM 80 MG PO TABS: 80.0000 mg | ORAL_TABLET | Freq: Every day | ORAL | 3 refills | Status: DC

## 2022-10-05 MED ORDER — METOPROLOL SUCCINATE ER 50 MG PO TB24
50.0000 mg | ORAL_TABLET | Freq: Every day | ORAL | 3 refills | Status: DC
Start: 2022-10-05 — End: 2023-09-29

## 2022-10-05 MED ORDER — LOSARTAN POTASSIUM 25 MG PO TABS
12.5000 mg | ORAL_TABLET | Freq: Every day | ORAL | 3 refills | Status: DC
Start: 2022-10-05 — End: 2023-08-15

## 2022-10-05 MED ORDER — AMLODIPINE BESYLATE 5 MG PO TABS
5.0000 mg | ORAL_TABLET | Freq: Every day | ORAL | 3 refills | Status: DC
Start: 2022-10-05 — End: 2023-09-29

## 2022-10-05 MED ORDER — ASPIRIN 81 MG PO TBEC: 81.0000 mg | DELAYED_RELEASE_TABLET | Freq: Every day | ORAL | 3 refills | Status: DC

## 2022-10-05 MED ORDER — EMPAGLIFLOZIN 10 MG PO TABS
ORAL_TABLET | ORAL | 3 refills | Status: DC
Start: 2022-10-05 — End: 2023-10-27

## 2022-10-05 NOTE — Progress Notes (Signed)
Follow up visit  Subjective:   Meredith Guerra, female    DOB: [birth year], 72 y.o.   MRN: [id number]    HPI  Chief Complaint  Patient presents with   Follow-up   Coronary artery disease involving native coronary artery of    72 y.o. Caucasian female  with hypertension, mixed hyperlipidemia, asthma, remote history of Takotsubo cardiomyopathy, s/p CABGX3 (LIMA-LAD, SVG-RI-SVG-OM) for STEMI presentation while recovering from COVID (03/2021), brief postop A. fib, now resolved.  Patient is doing well. She denies chest pain, shortness of breath, palpitations, leg edema, orthopnea, PND, TIA/syncope. Fatigue has improved after taking Vit B12. Reviewed recent test results with the patient, details below.       Current Outpatient Medications:    acetaminophen (TYLENOL) 500 MG tablet, Take 1,000 mg by mouth every 6 (six) hours as needed for mild pain, headache or fever., Disp: , Rfl:    albuterol (PROVENTIL) 2 MG tablet, Take 2 mg by mouth 3 (three) times daily as needed for wheezing or shortness of breath. (Patient not taking: Reported on 07/15/2022), Disp: , Rfl:    albuterol (VENTOLIN HFA) 108 (90 Base) MCG/ACT inhaler, Inhale 2 puffs into the lungs in the morning and at bedtime., Disp: , Rfl:    amLODipine (NORVASC) 5 MG tablet, TAKE 1 TABLET (5 MG TOTAL) BY MOUTH DAILY., Disp: 90 tablet, Rfl: 3   ASPIRIN LOW DOSE 81 MG tablet, TAKE 1 TABLET BY MOUTH EVERY DAY, Disp: 90 tablet, Rfl: 3   atomoxetine (STRATTERA) 10 MG capsule, Take 10 mg by mouth daily., Disp: , Rfl:    atorvastatin (LIPITOR) 80 MG tablet, TAKE 1 TABLET BY MOUTH EVERY DAY, Disp: 90 tablet, Rfl: 3   budesonide-formoterol (SYMBICORT) 160-4.5 MCG/ACT inhaler, Inhale 2 puffs into the lungs 2 (two) times daily., Disp: , Rfl:    butalbital-acetaminophen-caffeine (FIORICET) 50-325-40 MG tablet, Take 1 tablet by mouth 2 (two) times daily as needed for migraine., Disp: , Rfl:    Cholecalciferol (VITAMIN D-3) 25 MCG (1000 UT) CAPS, Take  1,000 Units by mouth daily., Disp: , Rfl:    clonazePAM (KLONOPIN) 0.5 MG tablet, Take 0.5 mg by mouth daily as needed., Disp: , Rfl:    DULoxetine (CYMBALTA) 30 MG capsule, Take 30 mg by mouth daily., Disp: , Rfl:    DULoxetine (CYMBALTA) 60 MG capsule, Take 60 mg by mouth at bedtime., Disp: , Rfl:    empagliflozin (JARDIANCE) 10 MG TABS tablet, TAKE 1 TABLET BY MOUTH EVERY DAY BEFORE BREAKFAST, Disp: 90 tablet, Rfl: 1   ketotifen (ZADITOR) 0.025 % ophthalmic solution, Place 1 drop into both eyes 2 (two) times daily as needed (allergies)., Disp: , Rfl:    levothyroxine (SYNTHROID) 25 MCG tablet, Take 37.5 mcg by mouth daily before breakfast., Disp: , Rfl:    loratadine (CLARITIN) 10 MG tablet, Take 10 mg by mouth daily., Disp: , Rfl:    losartan (COZAAR) 25 MG tablet, TAKE 1/2 TABLET BY MOUTH EVERY DAY, Disp: 45 tablet, Rfl: 5   metoprolol succinate (TOPROL-XL) 50 MG 24 hr tablet, TAKE 1 TABLET BY MOUTH EVERY DAY WITH OR IMMEDIATELY FOLLOWING A MEAL, Disp: 90 tablet, Rfl: 1   montelukast (SINGULAIR) 10 MG tablet, Take 10 mg by mouth at bedtime., Disp: , Rfl:    nitroGLYCERIN (NITROSTAT) 0.4 MG SL tablet, Place 1 tablet (0.4 mg total) under the tongue every 5 (five) minutes as needed for chest pain., Disp: 30 tablet, Rfl: 1   ondansetron (ZOFRAN) 4 MG tablet, Take 4  mg by mouth every 8 (eight) hours as needed for nausea or vomiting., Disp: , Rfl:    promethazine (PHENERGAN) 12.5 MG tablet, Take 12.5 mg by mouth every 4 (four) hours as needed for nausea or vomiting., Disp: , Rfl:    Rimegepant Sulfate (NURTEC) 75 MG TBDP, Take 75 mg by mouth daily as needed (migraines)., Disp: , Rfl:    tiZANidine (ZANAFLEX) 4 MG tablet, Take 2-8 mg by mouth See admin instructions. Take 8 mg every night, may take 2 mg during the day as needed for migraines, Disp: , Rfl:    traZODone (DESYREL) 150 MG tablet, Take 150 mg by mouth at bedtime., Disp: , Rfl:     Cardiovascular & other pertient studies:  EKG  07/15/2022: Sinus rhythm Right bundle branch block  Echocardiogram 06/15/2021:  Left ventricle cavity is normal in size and wall thickness. Abnormal  septal wall motion due to post-operative coronary artery bypass graft.  Normal LV systolic function with EF 56%. Doppler evidence of grade I  (impaired) diastolic dysfunction, normal LAP.  Mild tricuspid regurgitation.  No evidence of pulmonary hypertension.  Compared to previous study on 03/30/2021, wall motion abnormalities have  resolved. LVEF has improved from 30-35%.  EKG 06/10/2021: Sinus rhythm 67 bpm  Old anteroseptal infarct Diffuse nonspecific T-abnormality  Op note 04/06/2021 (Dr Donata Clay): 1.  Coronary artery bypass grafting x3 (left internal mammary artery to LAD, saphenous vein graft to ramus intermedius, saphenous vein graft to circumflex marginal).  2.  Endoscopic harvest of right leg greater saphenous vein.  Cardiac MRI 03/31/2021 1.  No late gadolinium enhancement, suggesting myocardium is viable 2. Normal LV size with mild systolic dysfunction (EF 50%). Apical hypokinesis 3.  Small RV size with normal systolic function (EF 66%)  Coronary angiogram 03/29/2021: LM: 90% distal stenosis at the trifurcation LAD: Proximal focal 80% stenosis at bifurcation with large septal perforator branch, focal mid 60% stenosis Ramus intermedius: No significant disease Left circumflex: Mid 30% stenosis RCA: Moderate diffuse disease in small caliber RPDA   Aspirin Aggrastat for next 4 hours, then IV heparin IV nitroglycerin BP control CVTS consult for CABG  Recent labs: 10/01/2022: Glucose 114, BUN/Cr 12/0.9. EGFR 65. K 4.1 Hb 15.5 HbA1C 6.2% Chol 108, TG 108, HDL 64, LDL 24 TSH 3.3 normal  12/01/2021: Glucose 106, BUN/Cr 13/1.02. EGFR 59. Na/K 146/4.4.  Chol 185, TG 159, HDL 60, LDL 98  04/30/2021: Chol 158, TG 227, HDL 58, LDL 78  04/12/2021: Glucose 119, BUN/Cr 12/0.8. EGFR >60. Na/K 140/4.4 H/H 11/33. MCV 90. Platelets  350 HbA1C 6.6% Chol 327, TG 221, HDL 51, LDL 221     Review of Systems  Cardiovascular:  Negative for chest pain, dyspnea on exertion, leg swelling, palpitations and syncope.         Vitals:   10/05/22 1512  BP: 119/77  Pulse: 66  Resp: 16  SpO2: 97%     Body mass index is 27.71 kg/m. Filed Weights   10/05/22 1512  Weight: 156 lb 6.4 oz (70.9 kg)      Objective:   Physical Exam Vitals and nursing note reviewed.  Constitutional:      General: She is not in acute distress. Neck:     Vascular: No JVD.  Cardiovascular:     Rate and Rhythm: Normal rate and regular rhythm.     Pulses:          Dorsalis pedis pulses are 0 on the right side and 0 on the left side.  Posterior tibial pulses are 0 on the right side and 1+ on the left side.     Heart sounds: Normal heart sounds. No murmur heard. Pulmonary:     Effort: Pulmonary effort is normal.     Breath sounds: Normal breath sounds. No wheezing or rales.  Musculoskeletal:     Right lower leg: No edema.     Left lower leg: No edema.     Visit diagnoses:    ICD-10-CM   1. Coronary artery disease involving native coronary artery of native heart without angina pectoris  I25.10     2. Primary hypertension  I10     3. Mixed hyperlipidemia  E78.2            Assessment & Recommendations:   72 y.o. Caucasian female  with hypertension, mixed hyperlipidemia, asthma, remote history of Takotsubo cardiomyopathy, CAD s/p CABGX3 (LIMA-LAD, SVG-RI, SVG-OM) for STEMI presentation while recovering from COVID (03/2021), brief postop A. fib, now resolved.  Coronary artery disease: S/p CABGX3 (LIMA-LAD, SVG-RI-SVG-OM) (03/2021) No recurrent chest pain EF has normalized. Continue Aspirin 81 mg daily.  Continue Lipitor 80 mg daily, Leqvio. Chol 108, TG 108, HDL 64, LDL 24 (09/2022). Continue Jardiance 10 mg daily.  See below re: hypertension management.  Hypertension: Well controlled.  Brief post op Afib: No  recurrence. Off amiodarone. Hold off anticoagulation as recurrence of A-fib   Mixed hyperlipidemia: As above.  F/u in 1 year    Elder Negus, MD Pager: [phone] Office: [phone]

## 2022-10-13 ENCOUNTER — Ambulatory Visit: Payer: Medicare Other | Admitting: Cardiology

## 2023-03-30 ENCOUNTER — Telehealth: Payer: Self-pay

## 2023-03-30 NOTE — Telephone Encounter (Signed)
   Auth Submission: NO AUTH NEEDED Payer: medicare [employer] [place] Medication & CPT/J Code(s) submitted: Leqvio  (Inclisiran) J1306 Route of submission (phone, fax, portal):  Phone # Fax # Auth type: Buy/Bill Units/visits requested: 284mg  x 2 doses Approval from: 12/20/21 to 04/20/24

## 2023-04-03 ENCOUNTER — Ambulatory Visit (INDEPENDENT_AMBULATORY_CARE_PROVIDER_SITE_OTHER): Payer: Medicare Other

## 2023-04-03 VITALS — BP 143/78 | HR 67 | Temp 98.1°F | Resp 18 | Ht 63.0 in | Wt 156.6 lb

## 2023-04-03 DIAGNOSIS — E782 Mixed hyperlipidemia: Secondary | ICD-10-CM

## 2023-04-03 MED ORDER — INCLISIRAN SODIUM 284 MG/1.5ML ~~LOC~~ SOSY
284.0000 mg | PREFILLED_SYRINGE | Freq: Once | SUBCUTANEOUS | Status: AC
Start: 2023-04-03 — End: 2023-04-03
  Administered 2023-04-03: 284 mg via SUBCUTANEOUS
  Filled 2023-04-03: qty 1.5

## 2023-04-03 NOTE — Progress Notes (Signed)
 Diagnosis: Hyperlipidemia  Provider:  Mannam, Praveen MD  Procedure: Injection  Leqvio  (inclisiran), Dose: 284 mg, Site: subcutaneous, Number of injections: 1  Injection Site(s): Left upper quad. abdomen  Post Care: Patient declined observation  Discharge: Condition: Good, Destination: Home . AVS Declined  Performed by:  Nahomi Hegner, RN

## 2023-08-15 ENCOUNTER — Other Ambulatory Visit: Payer: Self-pay | Admitting: Cardiology

## 2023-08-15 DIAGNOSIS — I2510 Atherosclerotic heart disease of native coronary artery without angina pectoris: Secondary | ICD-10-CM

## 2023-09-28 ENCOUNTER — Other Ambulatory Visit: Payer: Self-pay | Admitting: Cardiology

## 2023-09-28 DIAGNOSIS — I2510 Atherosclerotic heart disease of native coronary artery without angina pectoris: Secondary | ICD-10-CM

## 2023-09-28 DIAGNOSIS — R072 Precordial pain: Secondary | ICD-10-CM

## 2023-10-02 ENCOUNTER — Encounter: Payer: Self-pay | Admitting: Pulmonary Disease

## 2023-10-03 ENCOUNTER — Ambulatory Visit (INDEPENDENT_AMBULATORY_CARE_PROVIDER_SITE_OTHER): Payer: Medicare Other

## 2023-10-03 VITALS — BP 111/71 | HR 56 | Temp 99.0°F | Resp 22 | Ht 63.0 in | Wt 155.0 lb

## 2023-10-03 DIAGNOSIS — E782 Mixed hyperlipidemia: Secondary | ICD-10-CM

## 2023-10-03 MED ORDER — INCLISIRAN SODIUM 284 MG/1.5ML ~~LOC~~ SOSY
284.0000 mg | PREFILLED_SYRINGE | Freq: Once | SUBCUTANEOUS | Status: AC
  Administered 2023-10-03: 284 mg via SUBCUTANEOUS
  Filled 2023-10-03: qty 1.5

## 2023-10-03 NOTE — Progress Notes (Signed)
 Diagnosis: Hyperlipidemia  Provider:  Mannam, Praveen MD  Procedure: Injection  Leqvio  (inclisiran), Dose: 284 mg, Site: subcutaneous, Number of injections: 1  Injection Site(s): Right lower quad. abdomne  Post Care: Patient declined observation  Discharge: Condition: Good, Destination: Home . AVS Declined  Performed by:  Rocky FORBES Sar, RN

## 2023-10-05 ENCOUNTER — Ambulatory Visit: Payer: Self-pay | Admitting: Cardiology

## 2023-10-25 ENCOUNTER — Ambulatory Visit (INDEPENDENT_AMBULATORY_CARE_PROVIDER_SITE_OTHER)

## 2023-10-25 ENCOUNTER — Ambulatory Visit: Attending: Cardiology | Admitting: Cardiology

## 2023-10-25 ENCOUNTER — Encounter: Payer: Self-pay | Admitting: Cardiology

## 2023-10-25 VITALS — BP 122/71 | HR 49 | Resp 16 | Ht 63.0 in | Wt 154.8 lb

## 2023-10-25 DIAGNOSIS — I10 Essential (primary) hypertension: Secondary | ICD-10-CM | POA: Insufficient documentation

## 2023-10-25 DIAGNOSIS — I2510 Atherosclerotic heart disease of native coronary artery without angina pectoris: Secondary | ICD-10-CM | POA: Diagnosis not present

## 2023-10-25 DIAGNOSIS — R002 Palpitations: Secondary | ICD-10-CM | POA: Insufficient documentation

## 2023-10-25 DIAGNOSIS — E782 Mixed hyperlipidemia: Secondary | ICD-10-CM | POA: Insufficient documentation

## 2023-10-25 NOTE — Progress Notes (Unsigned)
 Applied a 14 day Zio XT monitor to patient in the office ?

## 2023-10-25 NOTE — Progress Notes (Signed)
 Cardiology Office Note:  .   Date:  10/25/2023  ID:  Sedonia LITTIE Corona, DOB 05-16-1950, MRN [id number] PCP: Bertell Satterfield, MD  [facility] Providers Cardiologist:  Newman Lawrence, MD PCP: Bertell Satterfield, MD  Chief Complaint  Patient presents with   Coronary artery disease involving native coronary artery of   Follow-up    1 year      LATEIA FRASER is a 73 y.o. female with hypertension, mixed hyperlipidemia, asthma, remote history of Takotsubo cardiomyopathy, s/p CABGX3 (LIMA-LAD, SVG-RI-SVG-OM) for STEMI presentation while recovering from COVID (03/2021), brief postop A. fib    History of Present Illness  Patient has recently experience symptoms of palpitations almost on a daily basis that stay for several hours.  She denies any chest pain, but has noticed some shortness of breath associated with the symptoms of palpitations.  Blood pressure is well-controlled.     Vitals:   10/25/23 1608  BP: 122/71  Pulse: (!) 49  Resp: 16  SpO2: 94%      Review of Systems  Cardiovascular:  Positive for palpitations. Negative for chest pain, dyspnea on exertion, leg swelling and syncope.  Respiratory:  Positive for shortness of breath.         Studies Reviewed: SABRA   EKG Interpretation Date/Time:  Wednesday October 25 2023 16:13:15 EDT Ventricular Rate:  50 PR Interval:  170 QRS Duration:  86 QT Interval:  442 QTC Calculation: 402 R Axis:   -23  Text Interpretation: Sinus bradycardia Minimal voltage criteria for LVH, may be normal variant ( R in aVL ) Nonspecific T wave abnormality When compared with ECG of 08-Apr-2021 18:21, Sinus rhythm has replaced Atrial flutter Vent. rate has decreased BY  92 BPM Right bundle branch block is no longer Present Confirmed by Lawrence Newman [phone]) on 10/25/2023 4:17:25 PM    Sinus bradycardia Minimal voltage criteria for LVH, may be normal variant ( R in aVL ) Nonspecific T wave abnormality When compared with ECG of 08-Apr-2021  18:21 Sinus rhythm has replaced Atrial flutter Vent. rate has decreased BY  92 BPM Right bundle branch block is no longer Present    Labs 03/2023: Hb 15.7 Cr 1.0 HbA1C 6.0% TSH 4.3  09/2022: Chol 108, TG 108, HDL 64, LDL 24   Echocardiogram 2023:  Left ventricle cavity is normal in size and wall thickness. Abnormal  septal wall motion due to post-operative coronary artery bypass graft.  Normal LV systolic function with EF 56%. Doppler evidence of grade I  (impaired) diastolic dysfunction, normal LAP.  Mild tricuspid regurgitation.  No evidence of pulmonary hypertension.  Compared to previous study on 03/30/2021, wall motion abnormalities have  resolved. LVEF has improved from 30-35%.    Op note 04/06/2021 (Dr Fleeta Ochoa): 1.  Coronary artery bypass grafting x3 (left internal mammary artery to LAD, saphenous vein graft to ramus intermedius, saphenous vein graft to circumflex marginal).  2.  Endoscopic harvest of right leg greater saphenous vein.    Risk Assessment/Calculations:    CHA2DS2-VASc Score = 4   This indicates a 4.8% annual risk of stroke. The patient's score is based upon: CHF History: 0 HTN History: 1 Diabetes History: 0 Stroke History: 0 Vascular Disease History: 1 Age Score: 1 Gender Score: 1      Physical Exam Vitals and nursing note reviewed.  Constitutional:      General: She is not in acute distress. Neck:     Vascular: No JVD.  Cardiovascular:     Rate  and Rhythm: Regular rhythm. Bradycardia present.     Heart sounds: Normal heart sounds. No murmur heard. Pulmonary:     Effort: Pulmonary effort is normal.     Breath sounds: Normal breath sounds. No wheezing or rales.  Musculoskeletal:     Right lower leg: No edema.     Left lower leg: No edema.      VISIT DIAGNOSES:   ICD-10-CM   1. Palpitations  R00.2 LONG TERM MONITOR (3-14 DAYS)    ECHOCARDIOGRAM COMPLETE    2. Coronary artery disease involving native coronary artery of native  heart without angina pectoris  I25.10 EKG 12-Lead    LONG TERM MONITOR (3-14 DAYS)    ECHOCARDIOGRAM COMPLETE    3. Primary hypertension  I10     4. Mixed hyperlipidemia  E78.2 Lipid panel       KAHEALANI YANKOVICH is a 73 y.o. female with hypertension, mixed hyperlipidemia, asthma, remote history of Takotsubo cardiomyopathy, s/p CABGX3 (LIMA-LAD, SVG-RI-SVG-OM) for STEMI presentation while recovering from COVID (03/2021), brief postop A. fib  Assessment & Plan  Palpitations: Symptoms concerning for atrial fibrillation.  Patient has had postop A-fib after bypass surgery, with no documented recurrence since, therefore not on anticoagulation.  Recommend 2-week ZIO monitor, along with echocardiogram. Continue metoprolol  succinate 50 mg daily for now. If A-fib diagnosed, she does not have significant options for rate control with excellent beta-blockade at baseline.  We may need to consider rhythm control options at that time.  Will follow-up after the monitor and echocardiogram.  Coronary artery disease: S/p CABGX3 (LIMA-LAD, SVG-RI-SVG-OM) (03/2021) No recurrent chest pain EF has normalized. Continue Aspirin  81 mg daily.  Should she need anticoagulation for A-fib, I will then stop aspirin . Continue Lipitor  80 mg daily, Leqvio . Check lipid panel.    Hypertension: Well controlled.   Mixed hyperlipidemia: As above.       F/u in 3 months  Signed, Newman JINNY Lawrence, MD

## 2023-10-25 NOTE — Patient Instructions (Signed)
 Lab Work: Lipid panel ( can be completed when come back for echo )   If you have labs (blood work) drawn today and your tests are completely normal, you will receive your results only by: MyChart Message (if you have MyChart) OR A paper copy in the mail If you have any lab test that is abnormal or we need to change your treatment, we will call you to review the results.  Testing/Procedures: 2 week zio   Your physician has requested that you wear a Zio heart monitor for __14___ days. This will be mailed to your home with instructions on how to apply the monitor and how to return it when finished. Please allow 2 weeks after returning the heart monitor before our office calls you with the results.   Echo  Your physician has requested that you have an echocardiogram. Echocardiography is a painless test that uses sound waves to create images of your heart. It provides your doctor with information about the size and shape of your heart and how well your heart's chambers and valves are working. This procedure takes approximately one hour. There are no restrictions for this procedure. Please do NOT wear cologne, perfume, aftershave, or lotions (deodorant is allowed). Please arrive 15 minutes prior to your appointment time.  Please note: We ask at that you not bring children with you during ultrasound (echo/ vascular) testing. Due to room size and safety concerns, children are not allowed in the ultrasound rooms during exams. Our front office staff cannot provide observation of children in our lobby area while testing is being conducted. An adult accompanying a patient to their appointment will only be allowed in the ultrasound room at the discretion of the ultrasound technician under special circumstances. We apologize for any inconvenience.   Follow-Up: At [facility], you and your health needs are our priority.  As part of our continuing mission to provide you with exceptional heart  care, our providers are all part of one team.  This team includes your primary Cardiologist (physician) and Advanced Practice Providers or APPs (Physician Assistants and Nurse Practitioners) who all work together to provide you with the care you need, when you need it.  Your next appointment:   3 month(s)  Provider:   Newman JINNY Lawrence, MD    We recommend signing up for the patient portal called MyChart.  Sign up information is provided on this After Visit Summary.  MyChart is used to connect with patients for Virtual Visits (Telemedicine).  Patients are able to view lab/test results, encounter notes, upcoming appointments, etc.  Non-urgent messages can be sent to your provider as well.   To learn more about what you can do with MyChart, go to [email or web address].   Other Instructions ZIO XT- Long Term Monitor Instructions  Your physician has requested you wear a ZIO patch monitor for _14___ days.   This is a single patch monitor. [employer] supplies one patch monitor per enrollment. Additional  stickers are not available. Please do not apply patch if you will be having a Nuclear Stress Test,  Echocardiogram, Cardiac CT, MRI, or Chest Xray during the period you would be wearing the  monitor. The patch cannot be worn during these tests. You cannot remove and re-apply the  ZIO XT patch monitor.   Your ZIO patch monitor will be mailed 3 day USPS to your address on file. It may take 3-5 days  to receive your monitor after you have been enrolled.   Once you  have received your monitor, please review the enclosed instructions. Your monitor  has already been registered assigning a specific monitor serial # to you.     Billing and Patient Assistance Program Information  We have supplied [employer] with any of your insurance information on file for billing purposes.  [employer] offers a sliding scale Patient Assistance Program for patients that do not have  insurance, or whose insurance does not  completely cover the cost of the ZIO monitor.  You must apply for the Patient Assistance Program to qualify for this discounted rate.   To apply, please call [employer] at [phone], select option 4, select option 2, ask to apply for  Patient Assistance Program. Meredeth will ask your household income, and how many people  are in your household. They will quote your out-of-pocket cost based on that information.  [employer] will also be able to set up a 73-month, interest-free payment plan if needed.     Applying the monitor  Shave hair from upper left chest.  Hold abrader disc by orange tab. Rub abrader in 40 strokes over the upper left chest as  indicated in your monitor instructions.  Clean area with 4 enclosed alcohol pads. Let dry.  Apply patch as indicated in monitor instructions. Patch will be placed under collarbone on left  side of chest with arrow pointing upward.  Rub patch adhesive wings for 2 minutes. Remove white label marked 1. Remove the white  label marked 2. Rub patch adhesive wings for 2 additional minutes.  While looking in a mirror, press and release button in center of patch. A small green light will  flash 3-4 times. This will be your only indicator that the monitor has been turned on.  Do not shower for the first 24 hours. You may shower after the first 24 hours.  Press the button if you feel a symptom. You will hear a small click. Record Date, Time and  Symptom in the Patient Logbook.  When you are ready to remove the patch, follow instructions on the last 2 pages of Patient  Logbook. Stick patch monitor onto the last page of Patient Logbook.   Place Patient Logbook in the blue and white box. Use locking tab on box and tape box closed  securely. The blue and white box has prepaid postage on it. Please place it in the mailbox as  soon as possible. Your physician should have your test results approximately 7 days after the  monitor has been mailed back to [facility].    Call [facility] Customer Care at [phone] if you have questions regarding  your ZIO XT patch monitor. Call them immediately if you see an orange light blinking on your  monitor.   If your monitor falls off in less than 4 days, contact our Monitor department at [phone].   If your monitor becomes loose or falls off after 4 days call [employer] at [phone] for  suggestions on securing your monitor.

## 2023-10-27 ENCOUNTER — Other Ambulatory Visit: Payer: Self-pay | Admitting: Cardiology

## 2023-10-27 DIAGNOSIS — I2510 Atherosclerotic heart disease of native coronary artery without angina pectoris: Secondary | ICD-10-CM

## 2023-11-17 ENCOUNTER — Ambulatory Visit: Payer: Self-pay | Admitting: Cardiology

## 2023-11-17 DIAGNOSIS — I2510 Atherosclerotic heart disease of native coronary artery without angina pectoris: Secondary | ICD-10-CM | POA: Diagnosis not present

## 2023-11-17 DIAGNOSIS — R002 Palpitations: Secondary | ICD-10-CM

## 2023-11-17 NOTE — Progress Notes (Signed)
 No atrial fibrillation noted.  Episodes of rapid heartbeat from top chamber of the heart noted.  Continue metoprolol .  Recommend vagal maneuvers with episodes of palpitations.  If not controlled with vagal maneuvers, could use diltiazem 30 mg 3 times daily as needed.  Will await echocardiogram.  Thanks MJP

## 2023-11-22 ENCOUNTER — Other Ambulatory Visit (HOSPITAL_COMMUNITY): Payer: Self-pay | Admitting: Internal Medicine

## 2023-11-22 DIAGNOSIS — R109 Unspecified abdominal pain: Secondary | ICD-10-CM

## 2023-11-22 DIAGNOSIS — R319 Hematuria, unspecified: Secondary | ICD-10-CM

## 2023-11-27 ENCOUNTER — Telehealth: Payer: Self-pay

## 2023-11-27 NOTE — Telephone Encounter (Signed)
 I called and spoke with patient. Patient has an appointment scheduled for this Friday. We did discuss potentially adding her to the schedule  this Wednesday with understanding there may be a wait in office to see MD. Patient will keep Friday scheduled visit.

## 2023-11-27 NOTE — Telephone Encounter (Signed)
 Patient called and requested sooner appt. She advised she is having back pain and her urine is foul smelling and dark in color. She has a CT tomorrow and is scheduled with us  in NOV. for a new pt appt.   She has a CT tomorrow and I have her on a wait list. She requested to drop off a urine but I made her aware she has to be established for any labs to be completed with our office.   I recommended she contact Dr. Auther forward but she advised this is their last week and isnt sure they can work her in.   Any suggestions?

## 2023-11-28 ENCOUNTER — Encounter: Payer: Self-pay | Admitting: Pulmonary Disease

## 2023-11-28 ENCOUNTER — Ambulatory Visit (HOSPITAL_COMMUNITY)
Admission: RE | Admit: 2023-11-28 | Discharge: 2023-11-28 | Disposition: A | Discharge status: Home / Self Care | Source: Ambulatory Visit | Attending: Internal Medicine | Admitting: Internal Medicine

## 2023-11-28 DIAGNOSIS — R319 Hematuria, unspecified: Secondary | ICD-10-CM | POA: Diagnosis present

## 2023-11-28 DIAGNOSIS — R109 Unspecified abdominal pain: Secondary | ICD-10-CM | POA: Insufficient documentation

## 2023-11-28 LAB — POCT I-STAT CREATININE: Creatinine, Ser: 0.9 mg/dL (ref 0.44–1.00)

## 2023-11-28 MED ORDER — IOHEXOL 300 MG/ML  SOLN
100.0000 mL | Freq: Once | INTRAMUSCULAR | Status: AC | PRN
  Administered 2023-11-28: 100 mL via INTRAVENOUS

## 2023-11-30 ENCOUNTER — Other Ambulatory Visit: Payer: Self-pay

## 2023-11-30 DIAGNOSIS — N200 Calculus of kidney: Secondary | ICD-10-CM

## 2023-12-01 ENCOUNTER — Encounter: Payer: Self-pay | Admitting: Urology

## 2023-12-01 ENCOUNTER — Ambulatory Visit (INDEPENDENT_AMBULATORY_CARE_PROVIDER_SITE_OTHER): Admitting: Urology

## 2023-12-01 VITALS — BP 131/78 | HR 79

## 2023-12-01 DIAGNOSIS — R310 Gross hematuria: Secondary | ICD-10-CM | POA: Diagnosis not present

## 2023-12-01 DIAGNOSIS — N200 Calculus of kidney: Secondary | ICD-10-CM

## 2023-12-01 DIAGNOSIS — Z87442 Personal history of urinary calculi: Secondary | ICD-10-CM

## 2023-12-01 LAB — URINALYSIS, ROUTINE W REFLEX MICROSCOPIC
Bilirubin, UA: NEGATIVE
Ketones, UA: NEGATIVE
Nitrite, UA: NEGATIVE
Specific Gravity, UA: 1.03 (ref 1.005–1.030)
Urobilinogen, Ur: 0.2 mg/dL (ref 0.2–1.0)
pH, UA: 5.5 (ref 5.0–7.5)

## 2023-12-01 LAB — MICROSCOPIC EXAMINATION: RBC, Urine: >30 /HPF — AB (ref 0–2)

## 2023-12-01 MED ORDER — SULFAMETHOXAZOLE-TRIMETHOPRIM 800-160 MG PO TABS: 1.0000 | ORAL_TABLET | Freq: Two times a day (BID) | ORAL | 0 refills | Status: AC

## 2023-12-01 NOTE — Progress Notes (Signed)
 12/01/2023 10:38 AM   Meredith Guerra 1950-10-29 [id number]  Referring provider: Bertell Satterfield, MD [address] [place],  [place] [address]  No chief complaint on file.   HPI: Meredith Guerra is a 73yo here for evaluation of gross hematuria. She has had gross hematuria intermittently for several years. She has a history of nephrolithiasis. She saw Dr. Bertell last week and it was normal. She then developed worsening urinary urgency, frequency and dysuria over the weekend. UA today is concerning for infection. CT 9/9 shows mild right hydronephrosis and ureteral enhancement. No tobacco abuse history. She had second hand smoke exposure for 40-50 years. She is on jardiance    PMH: Past Medical History:  Diagnosis Date   Asthma    Colitis    Hypertension    Kidney stones    Migraines     Surgical History: Past Surgical History:  Procedure Laterality Date   ABDOMINAL HYSTERECTOMY     CORONARY ARTERY BYPASS GRAFT N/A 04/06/2021   Procedure: CORONARY ARTERY BYPASS GRAFTING (CABG) X 3,ON PUMP, USING LEFT INTERNAL MAMMARY ARTERY AND RIGHT ENDOSCOPIC GREATER SAPHENOUS VEIN CONDUITS;  Surgeon: Obadiah Coy, MD;  Location: [facility] OR;  Service: Open Heart Surgery;  Laterality: N/A;   ENDOVEIN HARVEST OF GREATER SAPHENOUS VEIN Right 04/06/2021   Procedure: ENDOVEIN HARVEST OF GREATER SAPHENOUS VEIN;  Surgeon: Obadiah Coy, MD;  Location: [facility] OR;  Service: Open Heart Surgery;  Laterality: Right;   LEFT HEART CATH AND CORONARY ANGIOGRAPHY N/A 03/29/2021   Procedure: LEFT HEART CATH AND CORONARY ANGIOGRAPHY;  Surgeon: Elmira Newman PARAS, MD;  Location: [facility] INVASIVE CV LAB;  Service: Cardiovascular;  Laterality: N/A;   SHOULDER SURGERY     TEE WITHOUT CARDIOVERSION N/A 04/06/2021   Procedure: TRANSESOPHAGEAL ECHOCARDIOGRAM (TEE);  Surgeon: Obadiah Coy, MD;  Location: [facility] OR;  Service: Open Heart Surgery;  Laterality: N/A;   TONSILLECTOMY      Home Medications:  Allergies as of 12/01/2023        Reactions   Compazine [prochlorperazine] Anxiety   Hydromorphone Hcl Shortness Of Breath, Other (See Comments)   Chocolate Flavoring Agent (non-screening) Other (See Comments)   Dilaudid [hydromorphone]    Pt does not tolerate well, respiratory depression   Haloperidol Other (See Comments)   homicidal thoughts   Monosodium Glutamate Other (See Comments)   Severe headaches   Zonisamide    Other reaction(s): Not available   Reglan [metoclopramide] Anxiety   Zolmitriptan Anxiety        Medication List        Accurate as of December 01, 2023 10:38 AM. If you have any questions, ask your nurse or doctor.          acetaminophen  500 MG tablet Commonly known as: TYLENOL  Take 1,000 mg by mouth every 6 (six) hours as needed for mild pain, headache or fever.   albuterol  108 (90 Base) MCG/ACT inhaler Commonly known as: VENTOLIN  HFA Inhale 2 puffs into the lungs in the morning and at bedtime.   albuterol  2 MG tablet Commonly known as: PROVENTIL  Take 2 mg by mouth 3 (three) times daily as needed for wheezing or shortness of breath.   amLODipine  5 MG tablet Commonly known as: NORVASC  Take 1 tablet (5 mg total) by mouth daily.   Aspirin  Low Dose 81 MG tablet Generic drug: aspirin  EC TAKE 1 TABLET BY MOUTH EVERY DAY   atorvastatin  80 MG tablet Commonly known as: LIPITOR  TAKE 1 TABLET BY MOUTH EVERY DAY   budesonide-formoterol 160-4.5  MCG/ACT inhaler Commonly known as: SYMBICORT Inhale 2 puffs into the lungs 2 (two) times daily.   butalbital -acetaminophen -caffeine  50-325-40 MG tablet Commonly known as: FIORICET  Take 1 tablet by mouth 2 (two) times daily as needed for migraine.   clonazePAM  0.5 MG tablet Commonly known as: KLONOPIN  Take 0.5 mg by mouth daily as needed.   DULoxetine  30 MG capsule Commonly known as: CYMBALTA  Take 30 mg by mouth 2 (two) times daily.   Jardiance  10 MG Tabs tablet Generic drug: empagliflozin  TAKE 1 TABLET BY MOUTH EVERY DAY  BEFORE BREAKFAST   ketotifen 0.025 % ophthalmic solution Commonly known as: ZADITOR Place 1 drop into both eyes 2 (two) times daily as needed (allergies).   levothyroxine  25 MCG tablet Commonly known as: SYNTHROID  Take 37.5 mcg by mouth daily before breakfast.   loratadine  10 MG tablet Commonly known as: CLARITIN  Take 10 mg by mouth daily.   losartan  25 MG tablet Commonly known as: COZAAR  TAKE 1/2 TABLET BY MOUTH EVERY DAY   metoprolol  succinate 50 MG 24 hr tablet Commonly known as: TOPROL -XL TAKE 1 TABLET BY MOUTH DAILY. TAKE WITH OR IMMEDIATELY FOLLOWING A MEAL.   montelukast  10 MG tablet Commonly known as: SINGULAIR  Take 10 mg by mouth at bedtime.   nitroGLYCERIN  0.4 MG SL tablet Commonly known as: NITROSTAT  Place 1 tablet (0.4 mg total) under the tongue every 5 (five) minutes as needed for chest pain.   Nurtec 75 MG Tbdp Generic drug: Rimegepant Sulfate Take 75 mg by mouth daily as needed (migraines).   ondansetron  4 MG tablet Commonly known as: ZOFRAN  Take 4 mg by mouth every 8 (eight) hours as needed for nausea or vomiting.   promethazine 12.5 MG tablet Commonly known as: PHENERGAN Take 12.5 mg by mouth every 4 (four) hours as needed for nausea or vomiting.   tiZANidine  4 MG tablet Commonly known as: ZANAFLEX  Take 2-8 mg by mouth See admin instructions. Take 8 mg every night, may take 2 mg during the day as needed for migraines   traZODone  150 MG tablet Commonly known as: DESYREL  Take 150 mg by mouth at bedtime.   Vitamin D-3 25 MCG (1000 UT) Caps Take 1,000 Units by mouth daily.        Allergies:  Allergies  Allergen Reactions   Compazine [Prochlorperazine] Anxiety   Hydromorphone Hcl Shortness Of Breath and Other (See Comments)   Chocolate Flavoring Agent (Non-Screening) Other (See Comments)   Dilaudid [Hydromorphone]     Pt does not tolerate well, respiratory depression   Haloperidol Other (See Comments)    homicidal thoughts   Monosodium  Glutamate Other (See Comments)    Severe headaches   Zonisamide     Other reaction(s): Not available   Reglan [Metoclopramide] Anxiety   Zolmitriptan Anxiety    Family History: Family History  Problem Relation Age of Onset   Hypertension Mother    Heart disease Mother    Cancer Father    COPD Father    COPD Sister     Social History:  reports that she has never smoked. She has never used smokeless tobacco. She reports that she does not currently use alcohol. She reports that she does not currently use drugs.  ROS: All other review of systems were reviewed and are negative except what is noted above in HPI  Physical Exam: BP 131/78   Pulse 79   Constitutional:  Alert and oriented, No acute distress. HEENT: [place] AT, moist mucus membranes.  Trachea midline, no masses. Cardiovascular:  No clubbing, cyanosis, or edema. Respiratory: Normal respiratory effort, no increased work of breathing. GI: Abdomen is soft, nontender, nondistended, no abdominal masses GU: No CVA tenderness.  Lymph: No cervical or inguinal lymphadenopathy. Skin: No rashes, bruises or suspicious lesions. Neurologic: Grossly intact, no focal deficits, moving all 4 extremities. Psychiatric: Normal mood and affect.  Laboratory Data: Lab Results  Component Value Date   WBC 13.3 (H) 04/12/2021   HGB 11.0 (L) 04/12/2021   HCT 33.0 (L) 04/12/2021   MCV 90.4 04/12/2021   PLT 350 04/12/2021    Lab Results  Component Value Date   CREATININE 0.90 11/28/2023    No results found for: PSA  No results found for: TESTOSTERONE  Lab Results  Component Value Date   HGBA1C 6.2 (H) 03/30/2021    Urinalysis    Component Value Date/Time   COLORURINE YELLOW 04/06/2021 0438   APPEARANCEUR CLEAR 04/06/2021 0438   LABSPEC 1.015 04/06/2021 0438   PHURINE 6.5 04/06/2021 0438   GLUCOSEU >=500 (A) 04/06/2021 0438   HGBUR NEGATIVE 04/06/2021 0438   BILIRUBINUR NEGATIVE 04/06/2021 0438   KETONESUR NEGATIVE  04/06/2021 0438   PROTEINUR NEGATIVE 04/06/2021 0438   NITRITE NEGATIVE 04/06/2021 0438   LEUKOCYTESUR NEGATIVE 04/06/2021 0438    Lab Results  Component Value Date   BACTERIA RARE (A) 04/06/2021    Pertinent Imaging: Ct 11/28/2023: Images reviewed and discussed with the patient  No results found for this or any previous visit.  No results found for this or any previous visit.  No results found for this or any previous visit.  No results found for this or any previous visit.  No results found for this or any previous visit.  No results found for this or any previous visit.  No results found for this or any previous visit.  No results found for this or any previous visit.   Assessment & Plan:    1. Gross hematuria (Primary) -urine for culture -bactrim  ds BID for 7 days -followup 4-6 weeks - Urinalysis, Routine w reflex microscopic   No follow-ups on file.  Meredith Clara, MD  [facility]

## 2023-12-01 NOTE — Patient Instructions (Signed)
 Blood in the Pee (Hematuria) in Adults: What to Know  Hematuria is blood in the pee. You may be able to see blood in the pee. In some cases, a health care provider may find blood with a test.  Blood in the pee can be caused by infections of the kidney, bladder, or the urethra. The urethra is the tube that drains pee from the bladder.  Other causes may include: Kidney stones. Infection of the prostate. Cancer. Too much calcium in the pee. Conditions that are passed from parent to child. Too much exercise. Infections can be treated with medicine. A kidney stone will usually leave your body when you pee. If infections or kidney stones didn't cause the blood in the urine, then more tests may be needed. It is very important to tell your provider about any blood in your pee, even if you have no pain or the blood stops with no treatment. Blood in the pee can be a sign of a very serious problem, such as cancer. Follow these instructions at home: Medicines Take your medicines only as told. If you were given antibiotics, take them as told. Do not stop taking them even if you start to feel better. Eating and drinking Drink more fluids as told. Aim to drink 3-4 quarts (2.8-3.8 L) a day. Avoid caffeine, tea, and carbonated drinks. These can bother the bladder. Avoid alcohol if a female because it may irritate the prostate. General instructions If you have been diagnosed with a kidney stone, strain your pee to catch the stone if told by your provider. Empty your bladder often. Avoid holding pee for a long time. If you're female, make sure that: You wipe from front to back after using the bathroom. You use each piece of toilet paper only once. You pee before and after sex. It's up to you to get the results of any tests. Ask when your results will be ready and how to get them. You may need to call or meet with your provider to get your results. Keep all follow-up visits. Your provider will need to know  about any changes or any new symptoms. Contact a health care provider if: Your symptoms don't get better after 3 days. Your symptoms get worse. You have back pain or belly pain. You have a fever or chills. You throw up or feel like you may throw up. You throw up every time you take medicine. Get help right away if: You pass blood clots in your pee. You pass out. These symptoms may be an emergency. Call 911 right away. Do not wait to see if the symptoms will go away. Do not drive yourself to the hospital. This information is not intended to replace advice given to you by your health care provider. Make sure you discuss any questions you have with your health care provider. Document Revised: 12/22/2022 Document Reviewed: 12/01/2022 Elsevier Patient Education  2024 [employer].

## 2023-12-04 ENCOUNTER — Ambulatory Visit (HOSPITAL_COMMUNITY): Admission: RE | Admit: 2023-12-04 | Source: Ambulatory Visit

## 2023-12-04 ENCOUNTER — Ambulatory Visit (HOSPITAL_COMMUNITY)
Admission: RE | Admit: 2023-12-04 | Discharge: 2023-12-04 | Disposition: A | Discharge status: Home / Self Care | Source: Ambulatory Visit | Attending: Cardiology | Admitting: Cardiology

## 2023-12-04 DIAGNOSIS — R002 Palpitations: Secondary | ICD-10-CM | POA: Diagnosis present

## 2023-12-04 DIAGNOSIS — I2510 Atherosclerotic heart disease of native coronary artery without angina pectoris: Secondary | ICD-10-CM | POA: Diagnosis not present

## 2023-12-05 ENCOUNTER — Ambulatory Visit: Payer: Self-pay

## 2023-12-05 LAB — ECHOCARDIOGRAM COMPLETE
Area-P 1/2: 2.64 cm2
S' Lateral: 3.1 cm

## 2023-12-05 LAB — URINE CULTURE

## 2023-12-24 ENCOUNTER — Other Ambulatory Visit: Payer: Self-pay | Admitting: Cardiology

## 2023-12-28 ENCOUNTER — Other Ambulatory Visit: Payer: Self-pay | Admitting: Cardiology

## 2023-12-28 DIAGNOSIS — I2510 Atherosclerotic heart disease of native coronary artery without angina pectoris: Secondary | ICD-10-CM

## 2023-12-28 DIAGNOSIS — R072 Precordial pain: Secondary | ICD-10-CM

## 2024-01-12 ENCOUNTER — Ambulatory Visit: Admitting: Urology

## 2024-01-12 VITALS — BP 123/71 | HR 67

## 2024-01-12 DIAGNOSIS — R310 Gross hematuria: Secondary | ICD-10-CM

## 2024-01-12 DIAGNOSIS — Z87448 Personal history of other diseases of urinary system: Secondary | ICD-10-CM | POA: Diagnosis not present

## 2024-01-12 DIAGNOSIS — Z09 Encounter for follow-up examination after completed treatment for conditions other than malignant neoplasm: Secondary | ICD-10-CM | POA: Diagnosis not present

## 2024-01-12 LAB — URINALYSIS, ROUTINE W REFLEX MICROSCOPIC
Bilirubin, UA: NEGATIVE
Ketones, UA: NEGATIVE
Leukocytes,UA: NEGATIVE
Nitrite, UA: NEGATIVE
Protein,UA: NEGATIVE
RBC, UA: NEGATIVE
Specific Gravity, UA: 1.015 (ref 1.005–1.030)
Urobilinogen, Ur: 0.2 mg/dL (ref 0.2–1.0)
pH, UA: 6 (ref 5.0–7.5)

## 2024-01-12 NOTE — Progress Notes (Signed)
 01/12/2024 9:05 AM   [place] LITTIE Corona 08-17-1950 [id number]  Referring provider: Bertell Satterfield, MD [address] [place],  [place] [address]  Followup gross hematuria   HPI: Ms Blasco is a 73yo here for followup for gross hematuria. Hematuria resolved after treatment of her UTI. No dysuria. No significant LUTS. She denies nay flank pain. No other complaints today   PMH: Past Medical History:  Diagnosis Date   Asthma    Colitis    Hypertension    Kidney stones    Migraines     Surgical History: Past Surgical History:  Procedure Laterality Date   ABDOMINAL HYSTERECTOMY     CORONARY ARTERY BYPASS GRAFT N/A 04/06/2021   Procedure: CORONARY ARTERY BYPASS GRAFTING (CABG) X 3,ON PUMP, USING LEFT INTERNAL MAMMARY ARTERY AND RIGHT ENDOSCOPIC GREATER SAPHENOUS VEIN CONDUITS;  Surgeon: Obadiah Coy, MD;  Location: [facility] OR;  Service: Open Heart Surgery;  Laterality: N/A;   ENDOVEIN HARVEST OF GREATER SAPHENOUS VEIN Right 04/06/2021   Procedure: ENDOVEIN HARVEST OF GREATER SAPHENOUS VEIN;  Surgeon: Obadiah Coy, MD;  Location: [facility] OR;  Service: Open Heart Surgery;  Laterality: Right;   LEFT HEART CATH AND CORONARY ANGIOGRAPHY N/A 03/29/2021   Procedure: LEFT HEART CATH AND CORONARY ANGIOGRAPHY;  Surgeon: Elmira Newman PARAS, MD;  Location: [facility] IN[facility]AS[facility];  Service: Cardiovascular;  Laterality: N/A;   SHOULDER SURGERY     TEE WITHOUT CARDIOVERSION N/A 04/06/2021   Procedure: TRANSESOPHAGEAL ECHOCARDIOGRAM (TEE);  Surgeon: Obadiah Coy, MD;  Location: [facility] OR;  Service: Open Heart Surgery;  Laterality: N/A;   TONSILLECTOMY      Home Medications:  Allergies as of 01/12/2024       Reactions   Compazine [prochlorperazine] Anxiety   Hydromorphone Hcl Shortness Of Breath, Other (See Comments)   Chocolate Flavoring Agent (non-screening) Other (See Comments)   Dilaudid [hydromorphone]    Pt does not tolerate well, respiratory depression   Haloperidol Other (See Comments)    homicidal thoughts   Monosodium Glutamate Other (See Comments)   Severe headaches   Zonisamide    Other reaction(s): Not available   Reglan [metoclopramide] Anxiety   Zolmitriptan Anxiety        Medication List        Accurate as of January 12, 2024  9:05 AM. If you have any questions, ask your nurse or doctor.          acetaminophen  500 MG tablet Commonly known as: TYLENOL  Take 1,000 mg by mouth every 6 (six) hours as needed for mild pain, headache or fever.   albuterol  108 (90 Base) MCG/ACT inhaler Commonly known as: VENTOLIN  HFA Inhale 2 puffs into the lungs in the morning and at bedtime.   albuterol  2 MG tablet Commonly known as: PROVENTIL  Take 2 mg by mouth 3 (three) times daily as needed for wheezing or shortness of breath.   amLODipine  5 MG tablet Commonly known as: NORVASC  TAKE 1 TABLET (5 MG TOTAL) BY MOUTH DAILY.   Aspirin  Low Dose 81 MG tablet Generic drug: aspirin  EC TAKE 1 TABLET BY MOUTH EVERY DAY   atorvastatin  80 MG tablet Commonly known as: LIPITOR  TAKE 1 TABLET BY MOUTH EVERY DAY   budesonide-formoterol 160-4.5 MCG/ACT inhaler Commonly known as: SYMBICORT Inhale 2 puffs into the lungs 2 (two) times daily.   butalbital -acetaminophen -caffeine  50-325-40 MG tablet Commonly known as: FIORICET  Take 1 tablet by mouth 2 (two) times daily as needed for migraine.   clonazePAM  0.5 MG tablet Commonly known as: KLONOPIN   Take 0.5 mg by mouth daily as needed.   DULoxetine  30 MG capsule Commonly known as: CYMBALTA  Take 30 mg by mouth 2 (two) times daily.   Jardiance  10 MG Tabs tablet Generic drug: empagliflozin  TAKE 1 TABLET BY MOUTH EVERY DAY BEFORE BREAKFAST   ketotifen 0.025 % ophthalmic solution Commonly known as: ZADITOR Place 1 drop into both eyes 2 (two) times daily as needed (allergies).   levothyroxine  25 MCG tablet Commonly known as: SYNTHROID  Take 37.5 mcg by mouth daily before breakfast.   loratadine  10 MG tablet Commonly  known as: CLARITIN  Take 10 mg by mouth daily.   losartan  25 MG tablet Commonly known as: COZAAR  TAKE 1/2 TABLET BY MOUTH EVERY DAY   metoprolol  succinate 50 MG 24 hr tablet Commonly known as: TOPROL -XL TAKE 1 TABLET BY MOUTH EVERY DAY WITH OR IMMEDIATELY FOLLOWING A MEAL   montelukast  10 MG tablet Commonly known as: SINGULAIR  Take 10 mg by mouth at bedtime.   nitroGLYCERIN  0.4 MG SL tablet Commonly known as: NITROSTAT  Place 1 tablet (0.4 mg total) under the tongue every 5 (five) minutes as needed for chest pain.   Nurtec 75 MG Tbdp Generic drug: Rimegepant Sulfate Take 75 mg by mouth daily as needed (migraines).   ondansetron  4 MG tablet Commonly known as: ZOFRAN  Take 4 mg by mouth every 8 (eight) hours as needed for nausea or vomiting.   promethazine 12.5 MG tablet Commonly known as: PHENERGAN Take 12.5 mg by mouth every 4 (four) hours as needed for nausea or vomiting.   sulfamethoxazole -trimethoprim  800-160 MG tablet Commonly known as: BACTRIM  DS Take 1 tablet by mouth every 12 (twelve) hours.   tiZANidine  4 MG tablet Commonly known as: ZANAFLEX  Take 2-8 mg by mouth See admin instructions. Take 8 mg every night, may take 2 mg during the day as needed for migraines   traZODone  150 MG tablet Commonly known as: DESYREL  Take 150 mg by mouth at bedtime.   Vitamin D-3 25 MCG (1000 UT) Caps Take 1,000 Units by mouth daily.        Allergies:  Allergies  Allergen Reactions   Compazine [Prochlorperazine] Anxiety   Hydromorphone Hcl Shortness Of Breath and Other (See Comments)   Chocolate Flavoring Agent (Non-Screening) Other (See Comments)   Dilaudid [Hydromorphone]     Pt does not tolerate well, respiratory depression   Haloperidol Other (See Comments)    homicidal thoughts   Monosodium Glutamate Other (See Comments)    Severe headaches   Zonisamide     Other reaction(s): Not available   Reglan [Metoclopramide] Anxiety   Zolmitriptan Anxiety    Family  History: Family History  Problem Relation Age of Onset   Hypertension Mother    Heart disease Mother    Cancer Father    COPD Father    COPD Sister     Social History:  reports that she has never smoked. She has never used smokeless tobacco. She reports that she does not currently use alcohol. She reports that she does not currently use drugs.  ROS: All other review of systems were reviewed and are negative except what is noted above in HPI  Physical Exam: BP 123/71   Pulse 67   Constitutional:  Alert and oriented, No acute distress. HEENT: [place] AT, moist mucus membranes.  Trachea midline, no masses. Cardiovascular: No clubbing, cyanosis, or edema. Respiratory: Normal respiratory effort, no increased work of breathing. GI: Abdomen is soft, nontender, nondistended, no abdominal masses GU: No CVA tenderness.  Lymph:  No cervical or inguinal lymphadenopathy. Skin: No rashes, bruises or suspicious lesions. Neurologic: Grossly intact, no focal deficits, moving all 4 extremities. Psychiatric: Normal mood and affect.  Laboratory Data: Lab Results  Component Value Date   WBC 13.3 (H) 04/12/2021   HGB 11.0 (L) 04/12/2021   HCT 33.0 (L) 04/12/2021   MCV 90.4 04/12/2021   PLT 350 04/12/2021    Lab Results  Component Value Date   CREATININE 0.90 11/28/2023    No results found for: PSA  No results found for: TESTOSTERONE  Lab Results  Component Value Date   HGBA1C 6.2 (H) 03/30/2021    Urinalysis    Component Value Date/Time   COLORURINE YELLOW 04/06/2021 0438   APPEARANCEUR Clear 12/01/2023 1032   LABSPEC 1.015 04/06/2021 0438   PHURINE 6.5 04/06/2021 0438   GLUCOSEU 3+ (A) 12/01/2023 1032   HGBUR NEGATIVE 04/06/2021 0438   BILIRUBINUR Negative 12/01/2023 1032   KETONESUR NEGATIVE 04/06/2021 0438   PROTEINUR 2+ (A) 12/01/2023 1032   PROTEINUR NEGATIVE 04/06/2021 0438   NITRITE Negative 12/01/2023 1032   NITRITE NEGATIVE 04/06/2021 0438   LEUKOCYTESUR 1+ (A)  12/01/2023 1032   LEUKOCYTESUR NEGATIVE 04/06/2021 0438    Lab Results  Component Value Date   LABMICR See below: 12/01/2023   WBCUA 6-10 (A) 12/01/2023   LABEPIT 0-10 12/01/2023   BACTERIA Many (A) 12/01/2023    Pertinent Imaging:  No results found for this or any previous visit.  No results found for this or any previous visit.  No results found for this or any previous visit.  No results found for this or any previous visit.  No results found for this or any previous visit.  No results found for this or any previous visit.  No results found for this or any previous visit.  No results found for this or any previous visit.   Assessment & Plan:    1. Gross hematuria (Primary) resolved - Urinalysis, Routine w reflex microscopic   No follow-ups on file.  Belvie Clara, MD  [facility] [place]

## 2024-01-16 ENCOUNTER — Encounter: Payer: Self-pay | Admitting: Urology

## 2024-01-16 LAB — CYTOLOGY, URINE

## 2024-01-16 NOTE — Patient Instructions (Signed)
 Blood in the Pee (Hematuria) in Adults: What to Know  Hematuria is blood in the pee. You may be able to see blood in the pee. In some cases, a health care provider may find blood with a test.  Blood in the pee can be caused by infections of the kidney, bladder, or the urethra. The urethra is the tube that drains pee from the bladder.  Other causes may include: Kidney stones. Infection of the prostate. Cancer. Too much calcium in the pee. Conditions that are passed from parent to child. Too much exercise. Infections can be treated with medicine. A kidney stone will usually leave your body when you pee. If infections or kidney stones didn't cause the blood in the urine, then more tests may be needed. It is very important to tell your provider about any blood in your pee, even if you have no pain or the blood stops with no treatment. Blood in the pee can be a sign of a very serious problem, such as cancer. Follow these instructions at home: Medicines Take your medicines only as told. If you were given antibiotics, take them as told. Do not stop taking them even if you start to feel better. Eating and drinking Drink more fluids as told. Aim to drink 3-4 quarts (2.8-3.8 L) a day. Avoid caffeine, tea, and carbonated drinks. These can bother the bladder. Avoid alcohol if a female because it may irritate the prostate. General instructions If you have been diagnosed with a kidney stone, strain your pee to catch the stone if told by your provider. Empty your bladder often. Avoid holding pee for a long time. If you're female, make sure that: You wipe from front to back after using the bathroom. You use each piece of toilet paper only once. You pee before and after sex. It's up to you to get the results of any tests. Ask when your results will be ready and how to get them. You may need to call or meet with your provider to get your results. Keep all follow-up visits. Your provider will need to know  about any changes or any new symptoms. Contact a health care provider if: Your symptoms don't get better after 3 days. Your symptoms get worse. You have back pain or belly pain. You have a fever or chills. You throw up or feel like you may throw up. You throw up every time you take medicine. Get help right away if: You pass blood clots in your pee. You pass out. These symptoms may be an emergency. Call 911 right away. Do not wait to see if the symptoms will go away. Do not drive yourself to the hospital. This information is not intended to replace advice given to you by your health care provider. Make sure you discuss any questions you have with your health care provider. Document Revised: 12/22/2022 Document Reviewed: 12/01/2022 Elsevier Patient Education  2024 [employer].

## 2024-01-23 ENCOUNTER — Ambulatory Visit: Payer: Self-pay

## 2024-01-24 ENCOUNTER — Ambulatory Visit: Admitting: Urology

## 2024-01-25 ENCOUNTER — Ambulatory Visit: Attending: Cardiology | Admitting: Cardiology

## 2024-01-25 ENCOUNTER — Encounter: Payer: Self-pay | Admitting: Cardiology

## 2024-01-25 VITALS — BP 128/80 | HR 66 | Ht 63.0 in | Wt 153.0 lb

## 2024-01-25 DIAGNOSIS — I2510 Atherosclerotic heart disease of native coronary artery without angina pectoris: Secondary | ICD-10-CM | POA: Insufficient documentation

## 2024-01-25 DIAGNOSIS — E782 Mixed hyperlipidemia: Secondary | ICD-10-CM | POA: Diagnosis present

## 2024-01-25 DIAGNOSIS — I471 Supraventricular tachycardia, unspecified: Secondary | ICD-10-CM | POA: Insufficient documentation

## 2024-01-25 MED ORDER — DILTIAZEM HCL 30 MG PO TABS: 30.0000 mg | ORAL_TABLET | ORAL | 6 refills | Status: AC | PRN

## 2024-01-25 MED ORDER — DILTIAZEM HCL ER COATED BEADS 240 MG PO CP24: 240.0000 mg | ORAL_CAPSULE | Freq: Every day | ORAL | 2 refills | Status: AC

## 2024-01-25 NOTE — Patient Instructions (Addendum)
 Medication Instructions:   Start Diltiazem CD 180 mg  daily     Diltiazem  30 mg  three times day as needed ( rapid heart beart)  Stop taking Amlodipine     *If you need a refill on your cardiac medications before your next appointment, please call your pharmacy*   Lab Work: lipid If you have labs (blood work) drawn today and your tests are completely normal, you will receive your results only by: MyChart Message (if you have MyChart) OR A paper copy in the mail If you have any lab test that is abnormal or we need to change your treatment, we will call you to review the results.   Testing/Procedures:  Not needed  Follow-Up: At [facility], you and your health needs are our priority.  As part of our continuing mission to provide you with exceptional heart care, we have created designated Provider Care Teams.  These Care Teams include your primary Cardiologist (physician) and Advanced Practice Providers (APPs -  Physician Assistants and Nurse Practitioners) who all work together to provide you with the care you need, when you need it.     Your next appointment:   6 month(s)  The format for your next appointment:   In Person  Provider:   Newman JINNY Lawrence, MD   Other Instructions

## 2024-01-25 NOTE — Progress Notes (Signed)
 Cardiology Office Note:  .   Date:  01/25/2024  ID:  Meredith Guerra, DOB Jul 25, 1950, MRN [id number] PCP: Bertell Satterfield, MD  [facility] Providers Cardiologist:  Newman Lawrence, MD PCP: Bertell Satterfield, MD  Chief Complaint  Patient presents with   Hyperlipidemia   Hypertension     Meredith Guerra is a 73 y.o. female with hypertension, mixed hyperlipidemia, asthma, remote history of Takotsubo cardiomyopathy, s/p CABGX3 (LIMA-LAD, SVG-RI-SVG-OM) for STEMI presentation while recovering from COVID (03/2021), brief postop A. fib    History of Present Illness  Patient continues to have intermittent episodes of palpitations, lasting for several seconds.  She is compliant with her medical therapy.  Reviewed recent echocardiogram and monitor results with the patient, details below.     Vitals:   01/25/24 1502  BP: 128/80  Pulse: 66  SpO2: 96%       Review of Systems  Cardiovascular:  Positive for palpitations. Negative for chest pain, dyspnea on exertion, leg swelling and syncope.        Studies Reviewed: .        Sinus bradycardia Minimal voltage criteria for LVH, may be normal variant ( R in aVL ) Nonspecific T wave abnormality When compared with ECG of 08-Apr-2021 18:21 Sinus rhythm has replaced Atrial flutter Vent. rate has decreased BY  92 BPM Right bundle branch block is no longer Present    Echocardiogram 11/2023:  1. Left ventricular ejection fraction, by estimation, is 55 to 60%. The  left ventricle has normal function. The left ventricle has no regional  wall motion abnormalities. Left ventricular diastolic parameters were  normal.   2. Right ventricular systolic function is normal. The right ventricular  size is mildly enlarged. There is normal pulmonary artery systolic  pressure.   3. The mitral valve is normal in structure. Trivial mitral valve  regurgitation. No evidence of mitral stenosis.   4. The aortic valve is tricuspid. Aortic valve  regurgitation is not  visualized. No aortic stenosis is present.   5. The inferior vena cava is normal in size with greater than 50%  respiratory variability, suggesting right atrial pressure of 3 mmHg.   Comparison(s): No significant change from prior study.   Conclusion(s)/Recommendation(s): Normal biventricular function without  evidence of hemodynamically significant valvular heart disease.   Zio patch monitor 14 days 10/25/2023 - 11/08/2023: Dominant rhythm: Sinus. HR 37-94 bpm. Avg HR 57 bpm, in sinus rhythm, with intermittent bundle branch block. 6 episodes of atrial tachycardia, fastest at 150 bpm for 13 beats, detected within 45 seconds of symptomatic events. <1% isolated SVE, couplet/triplets. 0 episodes of VT. <1% isolated VE, couplet/triplets. No atrial fibrillation/atrial flutter/VT/high grade AV block, sinus pause >3sec noted. 19 patient triggered events, correlated with SVE, VE, atrial tachycardia.  Op note 04/06/2021 (Dr Fleeta Ochoa): 1.  Coronary artery bypass grafting x3 (left internal mammary artery to LAD, saphenous vein graft to ramus intermedius, saphenous vein graft to circumflex marginal).  2.  Endoscopic harvest of right leg greater saphenous vein.    Labs 03/2023: Hb 15.7 Cr 1.0 HbA1C 6.0% TSH 4.3  09/2022: Chol 108, TG 108, HDL 64, LDL 24  Risk Assessment/Calculations:    CHA2DS2-VASc Score = 4   This indicates a 4.8% annual risk of stroke. The patient's score is based upon: CHF History: 0 HTN History: 1 Diabetes History: 0 Stroke History: 0 Vascular Disease History: 1 Age Score: 1 Gender Score: 1      Physical Exam Vitals and nursing note  reviewed.  Constitutional:      General: She is not in acute distress. Neck:     Vascular: No JVD.  Cardiovascular:     Rate and Rhythm: Regular rhythm. Bradycardia present.     Heart sounds: Normal heart sounds. No murmur heard. Pulmonary:     Effort: Pulmonary effort is normal.     Breath sounds:  Normal breath sounds. No wheezing or rales.  Musculoskeletal:     Right lower leg: No edema.     Left lower leg: No edema.      VISIT DIAGNOSES:   ICD-10-CM   1. PSVT (paroxysmal supraventricular tachycardia)  I47.10     2. Coronary artery disease involving native coronary artery of native heart without angina pectoris  I25.10 Lipid panel    3. Mixed hyperlipidemia  E78.2 Lipid panel        Meredith Guerra is a 73 y.o. female with hypertension, mixed hyperlipidemia, asthma, remote history of Takotsubo cardiomyopathy, s/p CABGX3 (LIMA-LAD, SVG-RI-SVG-OM) for STEMI presentation while recovering from COVID (03/2021), brief postop A. fib  Assessment & Plan  Palpitations: No A-fib on monitor, episodes of SVT noted (10/2023). Discussed vagal maneuvers. Continue metoprolol  succinate 50 mg daily. Stop amlodipine  5 mg daily, and instead start diltiazem CD100 and 80 mg daily, along with diltiazem 30 mg 3 times daily as needed. If symptoms not controlled with medical therapy, will consider EP referral for ablation.  Coronary artery disease: S/p CABGX3 (LIMA-LAD, SVG-RI-SVG-OM) (03/2021) No recurrent chest pain EF has normalized. Continue Aspirin  81 mg daily.  Should she need anticoagulation for recurrent A-fib in future we will then stop aspirin .   Continue Lipitor  80 mg daily, Leqvio . Check lipid panel.    Hypertension: Well controlled.   Mixed hyperlipidemia: As above.      F/u in 6 months  Signed, Newman JINNY Lawrence, MD

## 2024-01-27 LAB — LIPID PANEL
Chol/HDL Ratio: 1.7 ratio (ref 0.0–4.4)
Cholesterol, Total: 118 mg/dL (ref 100–199)
HDL: 70 mg/dL (ref >39)
LDL Chol Calc (NIH): 25 mg/dL (ref 0–99)
Triglycerides: 139 mg/dL (ref 0–149)
VLDL Cholesterol Cal: 23 mg/dL (ref 5–40)

## 2024-01-29 ENCOUNTER — Ambulatory Visit: Payer: Self-pay | Admitting: Cardiology

## 2024-03-26 ENCOUNTER — Telehealth: Payer: Self-pay

## 2024-03-26 NOTE — Telephone Encounter (Signed)
 Auth Submission: NO AUTH NEEDED Site of care: Site of care: [facility] Payer: Medicare A/B with [employer] supplement Medication & CPT/J Code(s) submitted: Leqvio  (Inclisiran) J1306 Diagnosis Code:  Route of submission (phone, fax, portal):  Phone # Fax # Auth type: Buy/Bill PB Units/visits requested: 284mg  x 2 doses Reference number:  Approval from: 03/26/24 to 04/20/25

## 2024-04-05 ENCOUNTER — Ambulatory Visit

## 2024-04-10 ENCOUNTER — Ambulatory Visit

## 2024-04-10 VITALS — BP 137/67 | HR 59 | Temp 98.8°F | Resp 16 | Ht 63.0 in | Wt 159.8 lb

## 2024-04-10 DIAGNOSIS — E782 Mixed hyperlipidemia: Secondary | ICD-10-CM | POA: Diagnosis not present

## 2024-04-10 MED ORDER — INCLISIRAN SODIUM 284 MG/1.5ML ~~LOC~~ SOSY
284.0000 mg | PREFILLED_SYRINGE | Freq: Once | SUBCUTANEOUS | Status: AC
  Administered 2024-04-10: 284 mg via SUBCUTANEOUS
  Filled 2024-04-10: qty 1.5

## 2024-04-10 NOTE — Progress Notes (Signed)
 Diagnosis: Hyperlipidemia  Provider:  Chilton Greathouse MD  Procedure: Injection  Leqvio (inclisiran), Dose: 284 mg, Site: subcutaneous, Number of injections: 1  Injection Site(s): Right upper quad. abdomen  Post Care: Patient declined observation  Discharge: Condition: Good, Destination: Home . AVS Declined  Performed by:  Adriana Mccallum, RN

## 2024-10-09 ENCOUNTER — Ambulatory Visit

## 2025-01-24 ENCOUNTER — Ambulatory Visit: Admitting: Urology
